# Patient Record
Sex: Male | Born: 1970 | Race: White | Hispanic: No | Smoking: Never smoker
Health system: Southern US, Community
[De-identification: ages and names within clinical notes are randomized; demographics above are authoritative.]

## PROBLEM LIST (undated history)

## (undated) DIAGNOSIS — I10 Essential (primary) hypertension: Secondary | ICD-10-CM

## (undated) DIAGNOSIS — K319 Disease of stomach and duodenum, unspecified: Secondary | ICD-10-CM

## (undated) DIAGNOSIS — Z8719 Personal history of other diseases of the digestive system: Secondary | ICD-10-CM

## (undated) DIAGNOSIS — G4733 Obstructive sleep apnea (adult) (pediatric): Secondary | ICD-10-CM

## (undated) DIAGNOSIS — F32A Depression, unspecified: Secondary | ICD-10-CM

## (undated) DIAGNOSIS — Z87442 Personal history of urinary calculi: Secondary | ICD-10-CM

## (undated) DIAGNOSIS — K2270 Barrett's esophagus without dysplasia: Secondary | ICD-10-CM

## (undated) DIAGNOSIS — N400 Enlarged prostate without lower urinary tract symptoms: Secondary | ICD-10-CM

## (undated) DIAGNOSIS — K219 Gastro-esophageal reflux disease without esophagitis: Secondary | ICD-10-CM

## (undated) DIAGNOSIS — Z9989 Dependence on other enabling machines and devices: Secondary | ICD-10-CM

## (undated) DIAGNOSIS — F329 Major depressive disorder, single episode, unspecified: Secondary | ICD-10-CM

## (undated) DIAGNOSIS — K317 Polyp of stomach and duodenum: Secondary | ICD-10-CM

## (undated) DIAGNOSIS — Z87898 Personal history of other specified conditions: Secondary | ICD-10-CM

## (undated) DIAGNOSIS — G43909 Migraine, unspecified, not intractable, without status migrainosus: Secondary | ICD-10-CM

## (undated) HISTORY — DX: Personal history of urinary calculi: Z87.442

## (undated) HISTORY — DX: Disease of stomach and duodenum, unspecified: K31.9

## (undated) HISTORY — DX: Polyp of stomach and duodenum: K31.7

## (undated) HISTORY — DX: Migraine, unspecified, not intractable, without status migrainosus: G43.909

---

## 1997-02-14 HISTORY — PX: APPENDECTOMY: SHX54

## 2000-02-15 HISTORY — PX: NASAL SEPTUM SURGERY: SHX37

## 2004-02-05 ENCOUNTER — Ambulatory Visit: Payer: Self-pay | Admitting: General Surgery

## 2005-05-26 ENCOUNTER — Ambulatory Visit: Payer: Self-pay | Admitting: Otolaryngology

## 2006-11-07 ENCOUNTER — Ambulatory Visit: Payer: Self-pay | Admitting: Family Medicine

## 2007-06-01 ENCOUNTER — Encounter: Admission: RE | Admit: 2007-06-01 | Discharge: 2007-06-01 | Payer: Self-pay | Admitting: Family Medicine

## 2009-03-03 ENCOUNTER — Observation Stay: Payer: Self-pay | Admitting: Internal Medicine

## 2009-11-24 ENCOUNTER — Ambulatory Visit: Payer: Self-pay | Admitting: Family Medicine

## 2010-04-15 ENCOUNTER — Encounter (HOSPITAL_COMMUNITY): Payer: Self-pay | Admitting: Radiology

## 2010-04-15 ENCOUNTER — Emergency Department (HOSPITAL_COMMUNITY)
Admission: EM | Admit: 2010-04-15 | Discharge: 2010-04-15 | Disposition: A | Discharge status: Home / Self Care | Payer: 59 | Attending: Emergency Medicine | Admitting: Emergency Medicine

## 2010-04-15 ENCOUNTER — Emergency Department (HOSPITAL_COMMUNITY): Payer: 59

## 2010-04-15 DIAGNOSIS — K219 Gastro-esophageal reflux disease without esophagitis: Secondary | ICD-10-CM | POA: Insufficient documentation

## 2010-04-15 DIAGNOSIS — Z79899 Other long term (current) drug therapy: Secondary | ICD-10-CM | POA: Insufficient documentation

## 2010-04-15 DIAGNOSIS — G8929 Other chronic pain: Secondary | ICD-10-CM | POA: Insufficient documentation

## 2010-04-15 DIAGNOSIS — I10 Essential (primary) hypertension: Secondary | ICD-10-CM | POA: Insufficient documentation

## 2010-04-15 DIAGNOSIS — R109 Unspecified abdominal pain: Secondary | ICD-10-CM | POA: Insufficient documentation

## 2010-04-15 LAB — URINALYSIS, ROUTINE W REFLEX MICROSCOPIC
Ketones, ur: NEGATIVE mg/dL
Nitrite: NEGATIVE
Protein, ur: NEGATIVE mg/dL
Urobilinogen, UA: 0.2 mg/dL (ref 0.0–1.0)

## 2010-04-15 LAB — CBC
MCH: 29.9 pg (ref 26.0–34.0)
MCHC: 34.1 g/dL (ref 30.0–36.0)
Platelets: 286 10*3/uL (ref 150–400)

## 2010-04-15 LAB — BASIC METABOLIC PANEL
BUN: 10 mg/dL (ref 6–23)
CO2: 30 mEq/L (ref 19–32)
Calcium: 9.6 mg/dL (ref 8.4–10.5)
Chloride: 103 mEq/L (ref 96–112)
Creatinine, Ser: 1.01 mg/dL (ref 0.4–1.5)

## 2010-04-15 LAB — DIFFERENTIAL
Basophils Absolute: 0 10*3/uL (ref 0.0–0.1)
Basophils Relative: 0 % (ref 0–1)
Eosinophils Absolute: 0.1 10*3/uL (ref 0.0–0.7)
Monocytes Absolute: 0.6 10*3/uL (ref 0.1–1.0)
Monocytes Relative: 6 % (ref 3–12)
Neutrophils Relative %: 60 % (ref 43–77)

## 2010-10-10 ENCOUNTER — Ambulatory Visit: Payer: Self-pay | Admitting: Family Medicine

## 2011-02-15 HISTORY — PX: UPPER GI ENDOSCOPY: SHX6162

## 2011-04-12 ENCOUNTER — Emergency Department: Payer: Self-pay | Admitting: Emergency Medicine

## 2011-04-12 LAB — URINALYSIS, COMPLETE
Bacteria: NEGATIVE
Bilirubin,UR: NEGATIVE
Blood: NEGATIVE
Glucose,UR: NEGATIVE mg/dL (ref 0–75)
Leukocyte Esterase: NEGATIVE
Nitrite: NEGATIVE
RBC,UR: NONE SEEN /HPF (ref 0–5)
Squamous Epithelial: NONE SEEN
WBC UR: NONE SEEN /HPF (ref 0–5)

## 2011-04-12 LAB — COMPREHENSIVE METABOLIC PANEL
Albumin: 4.1 g/dL (ref 3.4–5.0)
Alkaline Phosphatase: 103 U/L (ref 50–136)
BUN: 13 mg/dL (ref 7–18)
Bilirubin,Total: 0.5 mg/dL (ref 0.2–1.0)
Calcium, Total: 8.8 mg/dL (ref 8.5–10.1)
Creatinine: 1.19 mg/dL (ref 0.60–1.30)
EGFR (Non-African Amer.): >60
Glucose: 97 mg/dL (ref 65–99)
Potassium: 4.1 mmol/L (ref 3.5–5.1)
SGPT (ALT): 73 U/L
Total Protein: 7.3 g/dL (ref 6.4–8.2)

## 2011-04-12 LAB — CBC
MCV: 89 fL (ref 80–100)
Platelet: 266 10*3/uL (ref 150–440)
RBC: 4.74 10*6/uL (ref 4.40–5.90)
WBC: 6.8 10*3/uL (ref 3.8–10.6)

## 2011-04-12 LAB — TROPONIN I: Troponin-I: <0.02 ng/mL

## 2011-04-12 LAB — CK TOTAL AND CKMB (NOT AT ARMC)
CK, Total: 140 U/L (ref 35–232)
CK-MB: 0.7 ng/mL (ref 0.5–3.6)

## 2011-04-12 LAB — APTT: Activated PTT: 30.6 secs (ref 23.6–35.9)

## 2011-12-08 ENCOUNTER — Ambulatory Visit: Payer: Self-pay | Admitting: Urology

## 2011-12-15 ENCOUNTER — Ambulatory Visit: Payer: Self-pay | Admitting: Urology

## 2012-07-11 ENCOUNTER — Other Ambulatory Visit: Payer: Self-pay | Admitting: Gastroenterology

## 2012-07-11 DIAGNOSIS — R1012 Left upper quadrant pain: Secondary | ICD-10-CM

## 2012-07-17 ENCOUNTER — Ambulatory Visit
Admission: RE | Admit: 2012-07-17 | Discharge: 2012-07-17 | Disposition: A | Discharge status: Home / Self Care | Payer: 59 | Source: Ambulatory Visit | Attending: Gastroenterology | Admitting: Gastroenterology

## 2012-07-17 ENCOUNTER — Other Ambulatory Visit: Payer: 59

## 2012-07-17 DIAGNOSIS — R1012 Left upper quadrant pain: Secondary | ICD-10-CM

## 2012-08-20 ENCOUNTER — Emergency Department: Payer: Self-pay | Admitting: Emergency Medicine

## 2012-08-30 ENCOUNTER — Emergency Department (HOSPITAL_COMMUNITY): Payer: 59

## 2012-08-30 ENCOUNTER — Encounter (HOSPITAL_COMMUNITY): Payer: Self-pay | Admitting: Emergency Medicine

## 2012-08-30 ENCOUNTER — Observation Stay (HOSPITAL_COMMUNITY)
Admission: EM | Admit: 2012-08-30 | Discharge: 2012-09-01 | Disposition: A | Discharge status: Home / Self Care | Payer: 59 | Attending: Internal Medicine | Admitting: Internal Medicine

## 2012-08-30 DIAGNOSIS — R51 Headache: Secondary | ICD-10-CM | POA: Insufficient documentation

## 2012-08-30 DIAGNOSIS — K208 Other esophagitis without bleeding: Secondary | ICD-10-CM | POA: Insufficient documentation

## 2012-08-30 DIAGNOSIS — T50905A Adverse effect of unspecified drugs, medicaments and biological substances, initial encounter: Secondary | ICD-10-CM | POA: Diagnosis present

## 2012-08-30 DIAGNOSIS — N179 Acute kidney failure, unspecified: Secondary | ICD-10-CM

## 2012-08-30 DIAGNOSIS — K2270 Barrett's esophagus without dysplasia: Secondary | ICD-10-CM | POA: Insufficient documentation

## 2012-08-30 DIAGNOSIS — N289 Disorder of kidney and ureter, unspecified: Secondary | ICD-10-CM | POA: Insufficient documentation

## 2012-08-30 DIAGNOSIS — J189 Pneumonia, unspecified organism: Principal | ICD-10-CM | POA: Insufficient documentation

## 2012-08-30 DIAGNOSIS — R0902 Hypoxemia: Secondary | ICD-10-CM | POA: Insufficient documentation

## 2012-08-30 DIAGNOSIS — G4733 Obstructive sleep apnea (adult) (pediatric): Secondary | ICD-10-CM | POA: Insufficient documentation

## 2012-08-30 HISTORY — DX: Barrett's esophagus without dysplasia: K22.70

## 2012-08-30 HISTORY — DX: Essential (primary) hypertension: I10

## 2012-08-30 HISTORY — DX: Adverse effect of unspecified drugs, medicaments and biological substances, initial encounter: T50.905A

## 2012-08-30 HISTORY — DX: Personal history of other diseases of the digestive system: Z87.19

## 2012-08-30 HISTORY — DX: Gastro-esophageal reflux disease without esophagitis: K21.9

## 2012-08-30 LAB — COMPREHENSIVE METABOLIC PANEL
ALT: 22 U/L (ref 0–53)
AST: 24 U/L (ref 0–37)
Albumin: 4.1 g/dL (ref 3.5–5.2)
Alkaline Phosphatase: 172 U/L — ABNORMAL HIGH (ref 39–117)
Chloride: 100 mEq/L (ref 96–112)
Potassium: 3.8 mEq/L (ref 3.5–5.1)
Sodium: 137 mEq/L (ref 135–145)
Total Bilirubin: 0.7 mg/dL (ref 0.3–1.2)

## 2012-08-30 LAB — CBC
HCT: 42.1 % (ref 39.0–52.0)
MCHC: 36.1 g/dL — ABNORMAL HIGH (ref 30.0–36.0)
Platelets: 385 10*3/uL (ref 150–400)
RDW: 12.8 % (ref 11.5–15.5)

## 2012-08-30 LAB — URINALYSIS, ROUTINE W REFLEX MICROSCOPIC
Glucose, UA: NEGATIVE mg/dL
Leukocytes, UA: NEGATIVE
Nitrite: NEGATIVE
Specific Gravity, Urine: 1.019 (ref 1.005–1.030)
pH: 7.5 (ref 5.0–8.0)

## 2012-08-30 LAB — POCT I-STAT, CHEM 8
Glucose, Bld: 99 mg/dL (ref 70–99)
HCT: 43 % (ref 39.0–52.0)
Hemoglobin: 14.6 g/dL (ref 13.0–17.0)
Potassium: 3.8 mEq/L (ref 3.5–5.1)
Sodium: 137 mEq/L (ref 135–145)

## 2012-08-30 LAB — STREP PNEUMONIAE URINARY ANTIGEN: Strep Pneumo Urinary Antigen: NEGATIVE

## 2012-08-30 LAB — CREATININE, URINE, RANDOM: Creatinine, Urine: 35.28 mg/dL

## 2012-08-30 MED ORDER — VANCOMYCIN HCL IN DEXTROSE 1-5 GM/200ML-% IV SOLN
1000.0000 mg | Freq: Once | INTRAVENOUS | Status: AC
  Administered 2012-08-30: 1000 mg via INTRAVENOUS
  Filled 2012-08-30: qty 200

## 2012-08-30 MED ORDER — LEVOFLOXACIN 500 MG PO TABS
500.0000 mg | ORAL_TABLET | Freq: Every day | ORAL | Status: AC
  Administered 2012-08-30 – 2012-08-31 (×2): 500 mg via ORAL
  Filled 2012-08-30 (×3): qty 1

## 2012-08-30 MED ORDER — ACETAMINOPHEN-CODEINE 120-12 MG/5ML PO SOLN
5.0000 mL | Freq: Four times a day (QID) | ORAL | Status: DC | PRN
  Administered 2012-08-31 (×3): 5 mL via ORAL
  Filled 2012-08-30 (×4): qty 10

## 2012-08-30 MED ORDER — PIPERACILLIN-TAZOBACTAM 3.375 G IVPB
3.3750 g | Freq: Once | INTRAVENOUS | Status: AC
  Administered 2012-08-30: 3.375 g via INTRAVENOUS
  Filled 2012-08-30: qty 50

## 2012-08-30 MED ORDER — ACETAMINOPHEN-CODEINE 120-12 MG/5ML PO SOLN
5.0000 mL | Freq: Once | ORAL | Status: AC
  Administered 2012-08-30: 5 mL via ORAL
  Filled 2012-08-30: qty 10

## 2012-08-30 MED ORDER — HEPARIN SODIUM (PORCINE) 5000 UNIT/ML IJ SOLN
5000.0000 [IU] | Freq: Three times a day (TID) | INTRAMUSCULAR | Status: DC
  Administered 2012-08-30 (×2): 5000 [IU] via SUBCUTANEOUS
  Filled 2012-08-30 (×8): qty 1

## 2012-08-30 MED ORDER — PANTOPRAZOLE SODIUM 40 MG PO TBEC
80.0000 mg | DELAYED_RELEASE_TABLET | Freq: Every day | ORAL | Status: DC
  Administered 2012-08-30 – 2012-08-31 (×2): 80 mg via ORAL
  Filled 2012-08-30 (×2): qty 2

## 2012-08-30 MED ORDER — SODIUM CHLORIDE 0.9 % IV SOLN
INTRAVENOUS | Status: DC
  Administered 2012-08-30: 06:00:00 via INTRAVENOUS
  Administered 2012-08-30: 1000 mL via INTRAVENOUS
  Administered 2012-08-31 (×2): via INTRAVENOUS

## 2012-08-30 MED ORDER — IOHEXOL 350 MG/ML SOLN
100.0000 mL | Freq: Once | INTRAVENOUS | Status: AC | PRN
  Administered 2012-08-30: 75 mL via INTRAVENOUS

## 2012-08-30 MED ORDER — SUCRALFATE 1 GM/10ML PO SUSP
1.0000 g | Freq: Three times a day (TID) | ORAL | Status: DC
  Administered 2012-08-30 – 2012-09-01 (×9): 1 g via ORAL
  Filled 2012-08-30 (×15): qty 10

## 2012-08-30 MED ORDER — SODIUM CHLORIDE 0.9 % IV SOLN
INTRAVENOUS | Status: AC
  Filled 2012-08-30: qty 100

## 2012-08-30 NOTE — ED Notes (Signed)
Pt o2 sat 86% on RA, pt placed on 2L via [place]

## 2012-08-30 NOTE — H&P (Signed)
Date: 08/30/2012               Patient Name:  John [name] MRN: [id number]  DOB: 04-Dec-1970 Age / Sex: 42 y.o., male   PCP: No primary provider on file.         Medical Service: Internal Medicine Teaching Service         Attending Physician: Dr. Jonah Blue, DO    First Contact: Dr. Mikey Bussing Pager: [phone]  Second Contact: Dr. Everardo Beals Pager: [phone]       After Hours (After 5p/  First Contact Pager: [phone]  weekends / holidays): Second Contact Pager: [phone]   Chief Complaint: cough, sore throat  History of Present Illness:  The patient is a 42 YO man with PMH of Barrett's esophagus, OSA, HTN who is presenting today for a cough and sore throat. He states that on July 4th he starting feeling poorly and felt that he had a cold. He states that he went to an urgent care and was told he had a pneumonia based on a chest x-ray. He was given prescription for doxycycline and started taking it. He then developed severe throat pain with this medication and stopped taking it. He again presented to the clinic and was told that he needed a different medication. He was then prescribed levaquin and started taking it the Saturday prior to admission (5 days pta). He states that his feelings of cough and SOB were improving with this medicine. He was felt like he had some fevers with this illness and those were also improving with the medicine. Overall his symptoms were improved with the exception of some night time cough and sore throat. So he went to his GI doctor (Dr. Elnoria Howard) yesterday and was given a prescription for fluconazole as he was told he might have ulcers and fungal infection. He started taking that and has not noticed a difference. He is currently having a sensation of obstruction in his throat and has self limited to liquid diet the last week or so. He does not smoke or drink alcohol or do other drugs. He does get migraines and has had some over the past two weeks. He has some chronic neck  stiffness which is unchanged recently and he is able to move his neck without pain. He is not having abdominal pain, nausea, vomiting, diarrhea, constipation. He is not having leg swelling and has not been immobilized in the last several weeks. He has not had any recent travel. He also has OSA and has been unable to use the mask due to his cough recently. He does also have high blood pressure and takes lisinopril for that which he has been taking throughout this episode.   Meds: Current Facility-Administered Medications  Medication Dose Route Frequency Provider Last Rate Last Dose  . 0.9 %  sodium chloride infusion   Intravenous Continuous Sunnie Nielsen, MD 125 mL/hr at 08/30/12 5784     Current Outpatient Prescriptions  Medication Sig Dispense Refill  . baclofen (LIORESAL) 10 MG tablet Take 10 mg by mouth 3 (three) times daily as needed (for muscle spasms).      . Diclofenac Potassium (CAMBIA PO) Take 50 mg by mouth daily as needed (for headaches).      . esomeprazole (NEXIUM) 40 MG capsule Take 40 mg by mouth daily before breakfast.      . fluconazole (DIFLUCAN) 100 MG tablet Take 100 mg by mouth daily.      Marland Kitchen guaiFENesin (MUCINEX) 600 MG 12 hr  tablet Take 600 mg by mouth 2 (two) times daily.      Marland Kitchen lamivudine (EPIVIR) 100 MG tablet Take 200 mg by mouth at bedtime.      Marland Kitchen levofloxacin (LEVAQUIN) 500 MG tablet Take 500 mg by mouth daily.      Marland Kitchen lisinopril (PRINIVIL,ZESTRIL) 10 MG tablet Take 10 mg by mouth daily.        Allergies: Allergies as of 08/30/2012 - Review Complete 08/30/2012  Allergen Reaction Noted  . Doxycycline Other (See Comments) 08/30/2012  . Nitroglycerin Other (See Comments) 08/30/2012   Past Medical History  Diagnosis Date  . Hx of appendectomy    History reviewed. No pertinent past surgical history. No family history on file. History   Social History  . Marital Status: Married    Spouse Name: N/A    Number of Children: N/A  . Years of Education: N/A    Occupational History  . Not on file.   Social History Main Topics  . Smoking status: Never Smoker   . Smokeless tobacco: Not on file  . Alcohol Use: No  . Drug Use: No  . Sexually Active: Not on file   Other Topics Concern  . Not on file   Social History Narrative  . No narrative on file    Review of Systems: A comprehensive 10 point review of systems was performed and other than pertinent positives and negatives listed above in HPI was negative.   Physical Exam: Blood pressure 121/70, pulse 69, temperature 98.4 F (36.9 C), temperature source Oral, resp. rate 16, SpO2 94.00%. General: resting in bed, pleasant, bearded HEENT: PERRL, EOMI, no scleral icterus, no erythema in the nostrils, some mild erythema in the throat but not inflammation or swelling or discharge Cardiac: RRR, no rubs, murmurs or gallops Pulm: clear to auscultation bilaterally, moving normal volumes of air, some mild inspiratory roughness which clears with cough Abd: soft, nontender, nondistended, BS present Ext: warm and well perfused, no pedal edema, no calf swelling or tenderness Neuro: alert and oriented X3, cranial nerves II-XII grossly intact   Lab results: Basic Metabolic Panel:  Recent Labs  40/98/11 0555  NA 137  K 3.8  CL 100  GLUCOSE 99  BUN 12  CREATININE 1.40*  Bicarb 28  CBC:  Recent Labs  08/30/12 0535 08/30/12 0555  WBC 8.3  --   HGB 15.2 14.6  HCT 42.1 43.0  MCV 88.1  --   PLT 385  --    Imaging results:  Dg Neck Soft Tissue  08/30/2012   *RADIOLOGY REPORT*  Clinical Data: Sore throat; mid chest discomfort.  Cough.  NECK SOFT TISSUES - 1+ VIEW  Comparison: None.  Findings: The nasopharynx, oropharynx and hypopharynx are grossly unremarkable in appearance.  The epiglottis remains normal in thickness.  The aryepiglottic folds are grossly unremarkable.  The proximal trachea is normal in caliber.  Prevertebral soft tissues are within normal limits.  No suspicious  radiopaque foreign bodies are identified.  No acute osseous abnormalities are seen.  Vertebral bodies demonstrate normal height and alignment; intervertebral disc spaces are preserved.  The visualized paranasal sinuses and mastoid air cells are well-aerated.  IMPRESSION: Unremarkable radiograph of the soft tissues of the neck.   Original Report Authenticated By: Tonia Ghent, M.D.   Dg Chest 2 View  08/30/2012   *RADIOLOGY REPORT*  Clinical Data: Sore throat; mid chest discomfort and cough.  CHEST - 2 VIEW  Comparison: None.  Findings: The lungs are well-aerated.  Peribronchial  thickening is noted.  There is no evidence of focal opacification, pleural effusion or pneumothorax.  The heart is normal in size; the mediastinal contour is within normal limits.  No acute osseous abnormalities are seen.  IMPRESSION: Peribronchial thickening noted; lungs otherwise clear.   Original Report Authenticated By: Tonia Ghent, M.D.   Ct Angio Chest Pe W/cm &/or Wo Cm  08/30/2012   *RADIOLOGY REPORT*  Clinical Data: Short of breath, hypoxia concern for pulmonary embolism  CT ANGIOGRAPHY CHEST  Technique:  Multidetector CT imaging of the chest using the standard protocol during bolus administration of intravenous contrast. Multiplanar reconstructed images including MIPs were obtained and reviewed to evaluate the vascular anatomy.  Contrast: 75mL OMNIPAQUE IOHEXOL 350 MG/ML SOLN  Comparison: Chest radiograph 08/30/2012  Findings: There are no filling defects within the pulmonary arteries to suggest acute pulmonary embolism.  No acute findings aorta great vessels.  No pericardial fluid.  Esophagus is normal.  No axillary supraclavicular adenopathy.  No mediastinal adenopathy.  There are multiple fine nodules within the left or right lower lobe which appeared to be in an air space pattern.  Ground-glass opacities in the upper lobes.  There is some potential early consolidation in the left lower lobe (image 76).  Limited view of  the upper abdomen is unremarkable.  Limited view of the skeleton is unremarkable.  The  IMPRESSION:  1.  No evidence of acute pulmonary embolism. 2.  Bilateral fine nodules in an air space pattern suggests diffuse pulmonary infection or inflammation. Consider atypical pulmonary infections.   Original Report Authenticated By: Genevive Bi, M.D.   Assessment & Plan by Problem:  AKI (acute kidney injury) - Likely to be pre-renal given his limited oral intake recently. Will order urine sodium and creatinine for FeNa. Could be ACE-I related and will hold on admission. Could expect that the CT angio chest could worsen this acute renal injury and will follow closely.  -CMP now (given only study I-stat) -BMP in AM -Continue fluids overnight -FeNa  Hypoxia - Likely to be from the resolving pneumonia. Likely not chronically hypoxic as his Hg levels are normal. The patient has a Wells score of 0 and would have been an excellent candidate for d-dimer to rule out PE however had already received CTangio chest in the ED which was negative for PE and ruled out chronic lung disease. Saw some non-specific changes associated with recent/developing pneumonia. Other possibilities include cardiac shunt. Would need ABG on room air to see if truly hypoxic. No concern for chronic lung disease. He does have OSA and may have some accumulation of tissue in the upper airway which is exacerbated by recent cold/pneumonia. Unclear if he was hypoxic when started on treatment for pneumonia.  -Monitor on O2 overnight -Wean as able -Consider ABG on room air to evaluate whether truly hypoxic  CAP (community acquired pneumonia) - Likely to be resolving/resolved. CXR clear currently and unable to view previous film with some non-specific changes on CTangio chest. Will continue levaquin PO to complete 7 day course for today and tomorrow then stop. Likely that the cough will continue for several weeks after pneumonia has resolved. Will  check strep and legionella urinary antigens. Will check sputum culture and blood cultures. Will check HIV.  -Urine strep/legionella antigens -HIV -Continue levaquin as symptomatically patient is doing better for today and tomorrow -O2 as needed with continuous pulse ox  Pill esophagitis - Sounds as though symptoms are fairly classic with doxycycline exposure. Will stop fluconazole as no  signs of fungal infection. Will use sucralfate for coating and protective effects. Will continue home PPI.  -Sucralfate with meals and before bedtime -Protonix daily -Mechanical soft diet    OSA (obstructive sleep apnea) - Will hold CPAP due to coughing and resume as able.   DVT ppx - heparin 5000 units  TID  Dispo: Disposition is deferred at this time, awaiting improvement of current medical problems. Anticipated discharge in approximately 1-2 day(s).   The patient does not have a current PCP (No primary provider on file.) and does need an [facility] hospital follow-up appointment after discharge.  The patient does not have transportation limitations that hinder transportation to clinic appointments.  Signed: Judie Bonus, MD 08/30/2012, 9:57 AM

## 2012-08-30 NOTE — ED Provider Notes (Signed)
History    CSN: [id number] Arrival date & time 08/30/12  0330  First MD Initiated Contact with Patient 08/30/12 0500     Chief Complaint  Patient presents with  . Sore Throat   (Consider location/radiation/quality/duration/timing/severity/associated sxs/prior Treatment) HPI History provided by patient. Has been on antibiotics for the last 2 weeks for pneumonia. Initially took doxycycline but did not tolerate that very well so he was switched to Levaquin. He has been having bodyaches, fevers and cough and reports that he had a chest x-ray at walk in clinic that showed pneumonia. 2 days ago developed sore throat with some voice changes. He is followed by gastroenterology for Barrett's esophagus, so he was evaluated in the GI clinic and yesterday prescribed fluconazole for possible "ulcerations". He presents to the emergency department for worsening throat pain, worse with cough. No trouble swallowing. He feels like he is having trouble breathing.  Symptoms moderate in severity. No emesis. No rash. No known sick contacts.  Past Medical History  Diagnosis Date  . Hx of appendectomy    History reviewed. No pertinent past surgical history. No family history on file. History  Substance Use Topics  . Smoking status: Never Smoker   . Smokeless tobacco: Not on file  . Alcohol Use: No    Review of Systems  Constitutional: Positive for fever and chills.  HENT: Positive for sore throat. Negative for neck pain and neck stiffness.   Eyes: Negative for pain.  Respiratory: Positive for cough and shortness of breath. Negative for wheezing.   Cardiovascular: Negative for chest pain.  Gastrointestinal: Negative for abdominal pain.  Genitourinary: Negative for dysuria.  Musculoskeletal: Negative for back pain.  Skin: Negative for rash.  Neurological: Negative for headaches.  All other systems reviewed and are negative.    Allergies  Doxycycline and Nitroglycerin  Home Medications    Current Outpatient Rx  Name  Route  Sig  Dispense  Refill  . baclofen (LIORESAL) 10 MG tablet   Oral   Take 10 mg by mouth 3 (three) times daily as needed (for muscle spasms).         . Diclofenac Potassium (CAMBIA PO)   Oral   Take 50 mg by mouth daily as needed (for headaches).         . esomeprazole (NEXIUM) 40 MG capsule   Oral   Take 40 mg by mouth daily before breakfast.         . fluconazole (DIFLUCAN) 100 MG tablet   Oral   Take 100 mg by mouth daily.         Marland Kitchen guaiFENesin (MUCINEX) 600 MG 12 hr tablet   Oral   Take 600 mg by mouth 2 (two) times daily.         Marland Kitchen lamivudine (EPIVIR) 100 MG tablet   Oral   Take 200 mg by mouth at bedtime.         Marland Kitchen levofloxacin (LEVAQUIN) 500 MG tablet   Oral   Take 500 mg by mouth daily.         Marland Kitchen lisinopril (PRINIVIL,ZESTRIL) 10 MG tablet   Oral   Take 10 mg by mouth daily.          BP 140/88  Pulse 82  Temp(Src) 98.6 F (37 C) (Oral)  Resp 18  SpO2 92% Physical Exam  Constitutional: He is oriented to person, place, and time. He appears well-developed and well-nourished.  HENT:  Head: Normocephalic and atraumatic.  Mouth/Throat: Oropharynx is clear  and moist. No oropharyngeal exudate.  Eyes: EOM are normal. Pupils are equal, round, and reactive to light.  Neck: Neck supple. No tracheal deviation present.  Cardiovascular: Normal rate, regular rhythm and intact distal pulses.   Pulmonary/Chest: Effort normal and breath sounds normal. No stridor. No respiratory distress. He has no wheezes.  Abdominal: Soft. There is no tenderness.  Musculoskeletal: Normal range of motion. He exhibits no edema and no tenderness.  Lymphadenopathy:    He has no cervical adenopathy.  Neurological: He is alert and oriented to person, place, and time.  Skin: Skin is warm and dry.    ED Course  Procedures (including critical care time)  Results for orders placed during the hospital encounter of 08/30/12  CBC      Result  Value Range   WBC 8.3  4.0 - 10.5 K/uL   RBC 4.78  4.22 - 5.81 MIL/uL   Hemoglobin 15.2  13.0 - 17.0 g/dL   HCT 45.4  09.8 - 11.9 %   MCV 88.1  78.0 - 100.0 fL   MCH 31.8  26.0 - 34.0 pg   MCHC 36.1 (*) 30.0 - 36.0 g/dL   RDW 14.7  82.9 - 56.2 %   Platelets 385  150 - 400 K/uL  SODIUM, URINE, RANDOM      Result Value Range   Sodium, Ur 46    CREATININE, URINE, RANDOM      Result Value Range   Creatinine, Urine 35.28    URINALYSIS, ROUTINE W REFLEX MICROSCOPIC      Result Value Range   Color, Urine YELLOW  YELLOW   APPearance CLEAR  CLEAR   Specific Gravity, Urine 1.019  1.005 - 1.030   pH 7.5  5.0 - 8.0   Glucose, UA NEGATIVE  NEGATIVE mg/dL   Hgb urine dipstick NEGATIVE  NEGATIVE   Bilirubin Urine NEGATIVE  NEGATIVE   Ketones, ur NEGATIVE  NEGATIVE mg/dL   Protein, ur NEGATIVE  NEGATIVE mg/dL   Urobilinogen, UA 0.2  0.0 - 1.0 mg/dL   Nitrite NEGATIVE  NEGATIVE   Leukocytes, UA NEGATIVE  NEGATIVE  COMPREHENSIVE METABOLIC PANEL      Result Value Range   Sodium 137  135 - 145 mEq/L   Potassium 3.8  3.5 - 5.1 mEq/L   Chloride 100  96 - 112 mEq/L   CO2 28  19 - 32 mEq/L   Glucose, Bld 103 (*) 70 - 99 mg/dL   BUN 11  6 - 23 mg/dL   Creatinine, Ser 1.30  0.50 - 1.35 mg/dL   Calcium 9.5  8.4 - 86.5 mg/dL   Total Protein 7.3  6.0 - 8.3 g/dL   Albumin 4.1  3.5 - 5.2 g/dL   AST 24  0 - 37 U/L   ALT 22  0 - 53 U/L   Alkaline Phosphatase 172 (*) 39 - 117 U/L   Total Bilirubin 0.7  0.3 - 1.2 mg/dL   GFR calc non Af Amer 71 (*) >90 mL/min   GFR calc Af Amer 82 (*) >90 mL/min  HIV ANTIBODY (ROUTINE TESTING)      Result Value Range   HIV NON REACTIVE  NON REACTIVE  STREP PNEUMONIAE URINARY ANTIGEN      Result Value Range   Strep Pneumo Urinary Antigen NEGATIVE  NEGATIVE  POCT I-STAT, CHEM 8      Result Value Range   Sodium 137  135 - 145 mEq/L   Potassium 3.8  3.5 - 5.1 mEq/L  Chloride 100  96 - 112 mEq/L   BUN 12  6 - 23 mg/dL   Creatinine, Ser 1.61 (*) 0.50 - 1.35 mg/dL    Glucose, Bld 99  70 - 99 mg/dL   Calcium, Ion 0.96  0.45 - 1.23 mmol/L   TCO2 28  0 - 100 mmol/L   Hemoglobin 14.6  13.0 - 17.0 g/dL   HCT 40.9  81.1 - 91.4 %   Dg Neck Soft Tissue  08/30/2012   *RADIOLOGY REPORT*  Clinical Data: Sore throat; mid chest discomfort.  Cough.  NECK SOFT TISSUES - 1+ VIEW  Comparison: None.  Findings: The nasopharynx, oropharynx and hypopharynx are grossly unremarkable in appearance.  The epiglottis remains normal in thickness.  The aryepiglottic folds are grossly unremarkable.  The proximal trachea is normal in caliber.  Prevertebral soft tissues are within normal limits.  No suspicious radiopaque foreign bodies are identified.  No acute osseous abnormalities are seen.  Vertebral bodies demonstrate normal height and alignment; intervertebral disc spaces are preserved.  The visualized paranasal sinuses and mastoid air cells are well-aerated.  IMPRESSION: Unremarkable radiograph of the soft tissues of the neck.   Original Report Authenticated By: Tonia Ghent, M.D.   Dg Chest 2 View  08/30/2012   *RADIOLOGY REPORT*  Clinical Data: Sore throat; mid chest discomfort and cough.  CHEST - 2 VIEW  Comparison: None.  Findings: The lungs are well-aerated.  Peribronchial thickening is noted.  There is no evidence of focal opacification, pleural effusion or pneumothorax.  The heart is normal in size; the mediastinal contour is within normal limits.  No acute osseous abnormalities are seen.  IMPRESSION: Peribronchial thickening noted; lungs otherwise clear.   Original Report Authenticated By: Tonia Ghent, M.D.   Ct Angio Chest Pe W/cm &/or Wo Cm  08/30/2012   *RADIOLOGY REPORT*  Clinical Data: Short of breath, hypoxia concern for pulmonary embolism  CT ANGIOGRAPHY CHEST  Technique:  Multidetector CT imaging of the chest using the standard protocol during bolus administration of intravenous contrast. Multiplanar reconstructed images including MIPs were obtained and reviewed to  evaluate the vascular anatomy.  Contrast: 75mL OMNIPAQUE IOHEXOL 350 MG/ML SOLN  Comparison: Chest radiograph 08/30/2012  Findings: There are no filling defects within the pulmonary arteries to suggest acute pulmonary embolism.  No acute findings aorta great vessels.  No pericardial fluid.  Esophagus is normal.  No axillary supraclavicular adenopathy.  No mediastinal adenopathy.  There are multiple fine nodules within the left or right lower lobe which appeared to be in an air space pattern.  Ground-glass opacities in the upper lobes.  There is some potential early consolidation in the left lower lobe (image 76).  Limited view of the upper abdomen is unremarkable.  Limited view of the skeleton is unremarkable.  The  IMPRESSION:  1.  No evidence of acute pulmonary embolism. 2.  Bilateral fine nodules in an air space pattern suggests diffuse pulmonary infection or inflammation. Consider atypical pulmonary infections.   Original Report Authenticated By: Genevive Bi, M.D.   IV antibiotics  Medicine consult / outpatient clinics to admit MDM  Persistent cough, fever and shortness of breath with hypoxia despite outpatient antibiotics  Labs. Imaging.  Medications provided  Oxygen provided  Medical admit  Sunnie Nielsen, MD 08/31/12 [phone]

## 2012-08-30 NOTE — Progress Notes (Signed)
Patient concerned that he is not getting enough oxygen. States that oxygen saturation goes down to 85 (this is only when movement of the hands in which probe is on). Feels that throat is swelling. Throat was assessed no swelling evident. Patient placed on 2L O2 [place] to make him more comfortable, oxygen was 90 on RA. Will continue to monitor.   Collin Hendley J. Lendell Caprice RN

## 2012-08-30 NOTE — ED Notes (Signed)
Pt transported to XR.  

## 2012-08-30 NOTE — ED Notes (Signed)
PT. REPORTS SORE THROAT WITH SOB CURRENTLY TAKING ORAL ANTIBIOTIC FOR PNEUMONIA.

## 2012-08-30 NOTE — ED Notes (Signed)
Pt has a urine cup and knows we need a urine sample from him

## 2012-08-30 NOTE — Plan of Care (Signed)
Problem: Phase I Progression Outcomes Goal: Initial discharge plan identified Outcome: Completed/Met Date Met:  08/30/12 To return home with wife

## 2012-08-30 NOTE — ED Notes (Signed)
Lunch tray ordered 

## 2012-08-30 NOTE — Progress Notes (Signed)
John [name] [id number] Admitted to 5W7: 08/30/2012 4:17 PM Attending Provider: Jonah Blue, DO    John [name] is a 42 y.o. male patient admitted from ED awake, alert  & orientated  X 3,  Full Code, VSS - Blood pressure 118/82, pulse 70, temperature 98.4 F (36.9 C), temperature source Oral, resp. rate 18, height 5\' 8"  (1.727 m), weight 83.734 kg (184 lb 9.6 oz), SpO2 92.00%., R/A, no c/o shortness of breath, no c/o chest pain, no distress noted.  placed and pt is currently running:na.   IV site WDL:  antecubital right, condition patent and no redness with a transparent dsg that's clean dry and intact.  Allergies:   Allergies  Allergen Reactions  . Doxycycline Other (See Comments)    Burning in throat  . Nitroglycerin Other (See Comments)    Severe headache     Past Medical History  Diagnosis Date  . Barrett esophagus   . Heart murmur     at birth  . Hypertension   . Headache(784.0)     chronic  . Sleep apnea     uses cpap  . Pneumonia 08/2012  . GERD (gastroesophageal reflux disease)   . H/O hiatal hernia     History:  obtained from the patient.  Pt orientation to unit, room and routine. Information packet given to patient/family and safety video watched.  Admission INP armband ID verified with patient/family, and in place. SR up x 2, fall risk assessment complete with Patient and family verbalizing understanding of risks associated with falls. Pt verbalizes an understanding of how to use the call bell and to call for help before getting out of bed.  Skin, clean-dry- intact without evidence of bruising, or skin tears.   No evidence of skin break down noted on exam.    Will cont to monitor and assist as needed.  Joana Reamer, RN 08/30/2012 4:17 PM

## 2012-08-30 NOTE — ED Notes (Signed)
Pt. returned from XR. 

## 2012-08-30 NOTE — ED Notes (Signed)
Room air O2 sat at rest 93%. O2 sat while ambulating 87%  MD notified.

## 2012-08-30 NOTE — ED Notes (Signed)
Phlebotomy at bedside.

## 2012-08-31 DIAGNOSIS — K2270 Barrett's esophagus without dysplasia: Secondary | ICD-10-CM

## 2012-08-31 LAB — BASIC METABOLIC PANEL
CO2: 29 mEq/L (ref 19–32)
Calcium: 8.6 mg/dL (ref 8.4–10.5)
Sodium: 139 mEq/L (ref 135–145)

## 2012-08-31 LAB — LEGIONELLA ANTIGEN, URINE

## 2012-08-31 MED ORDER — DEXTROSE 5 % IV SOLN
500.0000 mg | INTRAVENOUS | Status: DC
  Administered 2012-08-31: 500 mg via INTRAVENOUS
  Filled 2012-08-31 (×3): qty 500

## 2012-08-31 MED ORDER — PANTOPRAZOLE SODIUM 40 MG PO TBEC
40.0000 mg | DELAYED_RELEASE_TABLET | Freq: Two times a day (BID) | ORAL | Status: DC
  Administered 2012-08-31 – 2012-09-01 (×2): 40 mg via ORAL
  Filled 2012-08-31 (×2): qty 1

## 2012-08-31 MED ORDER — LAMOTRIGINE 200 MG PO TABS
200.0000 mg | ORAL_TABLET | Freq: Every day | ORAL | Status: DC
  Administered 2012-08-31: 200 mg via ORAL
  Filled 2012-08-31 (×2): qty 1

## 2012-08-31 MED ORDER — ONDANSETRON HCL 4 MG/2ML IJ SOLN
4.0000 mg | Freq: Four times a day (QID) | INTRAMUSCULAR | Status: DC | PRN
  Administered 2012-08-31: 4 mg via INTRAVENOUS

## 2012-08-31 MED ORDER — ONDANSETRON HCL 4 MG/2ML IJ SOLN
INTRAMUSCULAR | Status: AC
  Filled 2012-08-31: qty 2

## 2012-08-31 MED ORDER — PANTOPRAZOLE SODIUM 40 MG PO TBEC: 40.0000 mg | DELAYED_RELEASE_TABLET | Freq: Two times a day (BID) | ORAL | Status: DC

## 2012-08-31 MED ORDER — ONDANSETRON HCL 4 MG PO TABS: 4.0000 mg | ORAL_TABLET | Freq: Four times a day (QID) | ORAL | Status: DC | PRN

## 2012-08-31 MED ORDER — DEXTROSE 5 % IV SOLN
1.0000 g | INTRAVENOUS | Status: DC
  Administered 2012-08-31: 1 g via INTRAVENOUS
  Filled 2012-08-31 (×3): qty 10

## 2012-08-31 NOTE — Progress Notes (Signed)
Subjective: Patient reports he is doing OK.  Reports one episode of feeling like his throat was closing up.  He told the nurse he was SOB and the nurse placed a [place] tube on him and started 2L of O2 (per nurse this was not due to desaturation but to comfort patient).  He continues to cough up a small amount of sputum.  No hemoptysis.  Reports feeling slightly better overall. Objective: Vital signs in last 24 hours: Filed Vitals:   08/31/12 0137 08/31/12 0145 08/31/12 0520 08/31/12 0745  BP:   103/64   Pulse:   72   Temp:   99 F (37.2 C)   TempSrc:   Oral   Resp:   18   Height:    5\' 8"  (1.727 m)  Weight:    185 lb (83.915 kg)  SpO2: 91% 93% 92%    Weight change:   Intake/Output Summary (Last 24 hours) at 08/31/12 1421 Last data filed at 08/31/12 0745  Gross per 24 hour  Intake 3168.75 ml  Output      0 ml  Net 3168.75 ml   General:sitting on bed, NAD HEENT: EOMI, no scleral icterus Cardiac: RRR, no rubs, murmurs or gallops Pulm: clear to auscultation bilaterally Abd: soft, nontender, nondistended, BS normoactive Ext: warm and well perfused, no pedal edema Neuro: alert and oriented X3, cranial nerves II-XII grossly intact  Lab Results: Basic Metabolic Panel:  Recent Labs Lab 08/30/12 1105 08/31/12 0550  NA 137 139  K 3.8 3.9  CL 100 106  CO2 28 29  GLUCOSE 103* 102*  BUN 11 10  CREATININE 1.25 1.34  CALCIUM 9.5 8.6   Liver Function Tests:  Recent Labs Lab 08/30/12 1105  AST 24  ALT 22  ALKPHOS 172*  BILITOT 0.7  PROT 7.3  ALBUMIN 4.1   CBC:  Recent Labs Lab 08/30/12 0535 08/30/12 0555  WBC 8.3  --   HGB 15.2 14.6  HCT 42.1 43.0  MCV 88.1  --   PLT 385  --    Urinalysis:  Recent Labs Lab 08/30/12 1144  COLORURINE YELLOW  LABSPEC 1.019  PHURINE 7.5  GLUCOSEU NEGATIVE  HGBUR NEGATIVE  BILIRUBINUR NEGATIVE  KETONESUR NEGATIVE  PROTEINUR NEGATIVE  UROBILINOGEN 0.2  NITRITE NEGATIVE  LEUKOCYTESUR NEGATIVE    Micro Results: Recent  Results (from the past 240 hour(s))  CULTURE, BLOOD (ROUTINE X 2)     Status: None   Collection Time    08/30/12 11:00 AM      Result Value Range Status   Specimen Description BLOOD LEFT ANTECUBITAL   Final   Special Requests BOTTLES DRAWN AEROBIC ONLY 10CC   Final   Culture  Setup Time 08/30/2012 15:29   Final   Culture     Final   Value:        BLOOD CULTURE RECEIVED NO GROWTH TO DATE CULTURE WILL BE HELD FOR 5 DAYS BEFORE ISSUING A FINAL NEGATIVE REPORT   Report Status PENDING   Incomplete  CULTURE, BLOOD (ROUTINE X 2)     Status: None   Collection Time    08/30/12 11:05 AM      Result Value Range Status   Specimen Description BLOOD ARM LEFT   Final   Special Requests BOTTLES DRAWN AEROBIC ONLY 10CC   Final   Culture  Setup Time 08/30/2012 15:28   Final   Culture     Final   Value:        BLOOD CULTURE  RECEIVED NO GROWTH TO DATE CULTURE WILL BE HELD FOR 5 DAYS BEFORE ISSUING A FINAL NEGATIVE REPORT   Report Status PENDING   Incomplete   Studies/Results: Dg Neck Soft Tissue  08/30/2012   *RADIOLOGY REPORT*  Clinical Data: Sore throat; mid chest discomfort.  Cough.  NECK SOFT TISSUES - 1+ VIEW  Comparison: None.  Findings: The nasopharynx, oropharynx and hypopharynx are grossly unremarkable in appearance.  The epiglottis remains normal in thickness.  The aryepiglottic folds are grossly unremarkable.  The proximal trachea is normal in caliber.  Prevertebral soft tissues are within normal limits.  No suspicious radiopaque foreign bodies are identified.  No acute osseous abnormalities are seen.  Vertebral bodies demonstrate normal height and alignment; intervertebral disc spaces are preserved.  The visualized paranasal sinuses and mastoid air cells are well-aerated.  IMPRESSION: Unremarkable radiograph of the soft tissues of the neck.   Original Report Authenticated By: Tonia Ghent, M.D.   Dg Chest 2 View  08/30/2012   *RADIOLOGY REPORT*  Clinical Data: Sore throat; mid chest discomfort  and cough.  CHEST - 2 VIEW  Comparison: None.  Findings: The lungs are well-aerated.  Peribronchial thickening is noted.  There is no evidence of focal opacification, pleural effusion or pneumothorax.  The heart is normal in size; the mediastinal contour is within normal limits.  No acute osseous abnormalities are seen.  IMPRESSION: Peribronchial thickening noted; lungs otherwise clear.   Original Report Authenticated By: Tonia Ghent, M.D.   Ct Angio Chest Pe W/cm &/or Wo Cm  08/30/2012   *RADIOLOGY REPORT*  Clinical Data: Short of breath, hypoxia concern for pulmonary embolism  CT ANGIOGRAPHY CHEST  Technique:  Multidetector CT imaging of the chest using the standard protocol during bolus administration of intravenous contrast. Multiplanar reconstructed images including MIPs were obtained and reviewed to evaluate the vascular anatomy.  Contrast: 75mL OMNIPAQUE IOHEXOL 350 MG/ML SOLN  Comparison: Chest radiograph 08/30/2012  Findings: There are no filling defects within the pulmonary arteries to suggest acute pulmonary embolism.  No acute findings aorta great vessels.  No pericardial fluid.  Esophagus is normal.  No axillary supraclavicular adenopathy.  No mediastinal adenopathy.  There are multiple fine nodules within the left or right lower lobe which appeared to be in an air space pattern.  Ground-glass opacities in the upper lobes.  There is some potential early consolidation in the left lower lobe (image 76).  Limited view of the upper abdomen is unremarkable.  Limited view of the skeleton is unremarkable.  The  IMPRESSION:  1.  No evidence of acute pulmonary embolism. 2.  Bilateral fine nodules in an air space pattern suggests diffuse pulmonary infection or inflammation. Consider atypical pulmonary infections.   Original Report Authenticated By: Genevive Bi, M.D.   Medications: I have reviewed the patient's current medications. Scheduled Meds: . heparin  5,000 Units Subcutaneous Q8H  .  lamoTRIgine  200 mg Oral QHS  . pantoprazole  80 mg Oral Q1200  . sucralfate  1 g Oral TID WC & HS   Continuous Infusions: . sodium chloride 125 mL/hr at 08/31/12 1107   PRN Meds:.acetaminophen-codeine Assessment/Plan:   AKI (acute kidney injury) -Patient presented with Cr of 1.4, noted to have baseline of 1.  History of poor PO intake the days prior to admission.  Improved to 1.25 with IV fluids.  However patient received CT angio and this morning Cr was 1.34.  Will continue to monitor renal function. -FeNa 1.19%   CAP (community acquired pneumonia) -Change to  IV Ceftriaxone and IV azithromycin -HIV negative -Urine legionella negative -Urine strep pneumo negative -BCx 7/17 NGTD   Pill esophagitis -Patient reports irration started after taking doxycycline for 3 days. -Recently saw GI who prescribed fluconazole.  Will consider restarting, but no signs of Thrush on exam. -Change PPI to BID. -Dys 3 diet. -Carafate with meals.  DVT PPxL Heparin  Dispo: Disposition is deferred at this time, awaiting improvement of current medical problems.  Anticipated discharge in approximately 1-2 day(s).   The patient does not have a current PCP (No primary provider on file.) and does not know need an [facility] hospital follow-up appointment after discharge.  The patient does not have transportation limitations that hinder transportation to clinic appointments.  .Services Needed at time of discharge: Y = Yes, Blank = No PT:   OT:   RN:   Equipment:   Other:     LOS: 1 day   Carlynn Purl, DO 08/31/2012, 2:21 PM

## 2012-08-31 NOTE — Progress Notes (Signed)
Utilization review completed. Moncia Annas, RN, BSN. 

## 2012-08-31 NOTE — Progress Notes (Signed)
Nutrition Brief Note  Patient identified on the Malnutrition Screening Tool (MST) Report for recent weight loss without trying and eating poorly because of a decreased appetite.    Patient reports PTA he was on a "liquid diet" because of throat irritation with his ABX.  He reports he's eating well at this time.  Patient also endorses some weight loss, however, not significant for time frame.  Body mass index is 28.14 kg/(m^2). Patient meets criteria for Overweight based on current BMI.   Current diet order is Dysphagia 3, thin liquids.  Patient is consuming approximately 100% of meals at this time. Labs and medications reviewed.   No nutrition interventions warranted at this time. If nutrition issues arise, please consult RD.   Maureen Chatters, RD, LDN Pager #: [phone] After-Hours Pager #: [phone]

## 2012-08-31 NOTE — H&P (Signed)
INTERNAL MEDICINE TEACHING SERVICE Attending Admission Note  Date: 08/31/2012  Patient name: John [name]  Medical record number: [id number]  Date of birth: [birth year]    I have seen and evaluated [name] and discussed their care with the Residency Team.  41 yr. Old male w/ hx barrett's esophagus, HTN, OSA, presented with cough, sore throat. He states he's been feeling malaise since July 4th. Admits to little to no improvement on oral antibiotic therapy after he was diagnosed outpatient with CAP. He states the first antibiotic given, doxycycline, caused pain and difficulty swallowing. He saw his GI physician who prescribed him fluconazole and asked him to stop doxy. He was then given Levaquin which did not improve his symptoms. He is noted to have acute hypoxic respiratory failure with CAP. He has bilateral nodules/bilateral hazy infiltrates.  He is afebrile and hemodynamically stable. On exam, he has mild exp wheeze.  He is noted to have hypoxia on ambulation. Change Abx to IV Rocephin and IV azithromycin. Place on BID PPI due to likely pill esophagitis. Monitor for any clinical deterioration. Jonah Blue, DO 7/18/20142:48 PM

## 2012-08-31 NOTE — Progress Notes (Signed)
Patient would prefer not to take the heparin injections. States that he will ambulate as often as needed to ensure he does not develop any DVT's. Will continue to monitor.  John [name] J. Lendell Caprice RN BSN

## 2012-09-01 DIAGNOSIS — G4733 Obstructive sleep apnea (adult) (pediatric): Secondary | ICD-10-CM

## 2012-09-01 LAB — COMPREHENSIVE METABOLIC PANEL
Albumin: 3.7 g/dL (ref 3.5–5.2)
Alkaline Phosphatase: 141 U/L — ABNORMAL HIGH (ref 39–117)
BUN: 11 mg/dL (ref 6–23)
Calcium: 9 mg/dL (ref 8.4–10.5)
Creatinine, Ser: 1.32 mg/dL (ref 0.50–1.35)
GFR calc Af Amer: 77 mL/min — ABNORMAL LOW (ref >90)
Glucose, Bld: 99 mg/dL (ref 70–99)
Total Protein: 6.6 g/dL (ref 6.0–8.3)

## 2012-09-01 LAB — CBC WITH DIFFERENTIAL/PLATELET
Lymphocytes Relative: 41 % (ref 12–46)
Lymphs Abs: 2.6 10*3/uL (ref 0.7–4.0)
MCV: 88.4 fL (ref 78.0–100.0)
Neutrophils Relative %: 48 % (ref 43–77)
Platelets: 376 10*3/uL (ref 150–400)
RBC: 4.49 MIL/uL (ref 4.22–5.81)
WBC: 6.5 10*3/uL (ref 4.0–10.5)

## 2012-09-01 MED ORDER — SUCRALFATE 1 GM/10ML PO SUSP: 1.0000 g | Freq: Three times a day (TID) | ORAL | Status: DC

## 2012-09-01 MED ORDER — ACETAMINOPHEN 500 MG PO TABS
500.0000 mg | ORAL_TABLET | Freq: Once | ORAL | Status: AC
  Administered 2012-09-01: 500 mg via ORAL
  Filled 2012-09-01: qty 1

## 2012-09-01 MED ORDER — SODIUM CHLORIDE 0.9 % IV SOLN: INTRAVENOUS | Status: DC

## 2012-09-01 MED ORDER — AZITHROMYCIN 250 MG PO TABS: 250.0000 mg | ORAL_TABLET | Freq: Every day | ORAL | Status: DC

## 2012-09-01 NOTE — Discharge Summary (Signed)
Name: John [name] MRN: [id number] DOB: 13-Jun-1970 42 y.o. PCP: No primary provider on file.  Date of Admission: 08/30/2012  4:30 AM Date of Discharge: 09/01/2012 Attending Physician: Jonah Blue, DO  Discharge Diagnosis: Principal Problem:   CAP (community acquired pneumonia) Active Problems:   Pill esophagitis   Hypoxia   OSA (obstructive sleep apnea)  Discharge Medications:   Medication List    STOP taking these medications       CAMBIA PO     lamivudine 100 MG tablet  Commonly known as:  EPIVIR     levofloxacin 500 MG tablet  Commonly known as:  LEVAQUIN      TAKE these medications       azithromycin 250 MG tablet  Commonly known as:  ZITHROMAX  Take 1 tablet (250 mg total) by mouth daily.     baclofen 10 MG tablet  Commonly known as:  LIORESAL  Take 10 mg by mouth 3 (three) times daily as needed (for muscle spasms).     esomeprazole 40 MG capsule  Commonly known as:  NEXIUM  Take 40 mg by mouth daily before breakfast.     fluconazole 100 MG tablet  Commonly known as:  DIFLUCAN  Take 100 mg by mouth daily.     guaiFENesin 600 MG 12 hr tablet  Commonly known as:  MUCINEX  Take 600 mg by mouth 2 (two) times daily.     lisinopril 10 MG tablet  Commonly known as:  PRINIVIL,ZESTRIL  Take 10 mg by mouth daily.     sucralfate 1 GM/10ML suspension  Commonly known as:  CARAFATE  Take 10 mLs (1 g total) by mouth 4 (four) times daily -  with meals and at bedtime.        Disposition and follow-up:   John [name] was discharged from [facility] in Good condition.  At the hospital follow up visit please address:  1.  Improvement of respiratory symptoms/assess need for O2  2.  Labs / imaging needed at time of follow-up: CXR 6 weeks from 08/30/12; if patient establishes care at [facility], he will need further work up of his renal dysfunction - unclear if acute or chronic; follow up blood cultures.   Follow-up Appointments:       Follow-up Information   Follow up with Gust Rung, DO. Schedule an appointment as soon as possible for a visit in 1 week. (hospital follow up)    Contact information:   [address]  [place] [address] [phone]       Discharge Instructions: Discharge Orders   Future Orders Complete By Expires     Call MD for:  difficulty breathing, headache or visual disturbances  As directed     Call MD for:  extreme fatigue  As directed     Call MD for:  persistant dizziness or light-headedness  As directed     Call MD for:  persistant nausea and vomiting  As directed     Call MD for:  temperature >100.4  As directed     Diet - low sodium heart healthy  As directed     Discharge instructions  As directed     Comments:      -Please start taking azithromycin 250mg  daily (starting on the day of discharge) for 4 days total  -You may continue carafate as needed to help with your throat pain. -You may use your oxygen at home as needed for shortness of breath, but  if your symptoms worsen instead of improve, seek immediate medical attention  -We apologize for all of the inconveniences during your hospitalization.  Your discharge paperwork tells you to stop taking lamivudine, but we are aware that you are NOT taking this medication.  We have documented that your are NOT taking this medication, and instead you are taking lamictal.  Lamivudine was never ordered for you during this hospitalization.    Increase activity slowly  As directed        Procedures Performed:  Dg Neck Soft Tissue 08/30/2012   *RADIOLOGY REPORT*  Clinical Data: Sore throat; mid chest discomfort.  Cough.  NECK SOFT TISSUES - 1+ VIEW  Comparison: None.  Findings: The nasopharynx, oropharynx and hypopharynx are grossly unremarkable in appearance.  The epiglottis remains normal in thickness.  The aryepiglottic folds are grossly unremarkable.  The proximal trachea is normal in caliber.  Prevertebral soft tissues are within  normal limits.  No suspicious radiopaque foreign bodies are identified.  No acute osseous abnormalities are seen.  Vertebral bodies demonstrate normal height and alignment; intervertebral disc spaces are preserved.  The visualized paranasal sinuses and mastoid air cells are well-aerated.  IMPRESSION: Unremarkable radiograph of the soft tissues of the neck.   Original Report Authenticated By: Tonia Ghent, M.D.   Dg Chest 2 View 08/30/2012   *RADIOLOGY REPORT*  Clinical Data: Sore throat; mid chest discomfort and cough.  CHEST - 2 VIEW  Comparison: None.  Findings: The lungs are well-aerated.  Peribronchial thickening is noted.  There is no evidence of focal opacification, pleural effusion or pneumothorax.  The heart is normal in size; the mediastinal contour is within normal limits.  No acute osseous abnormalities are seen.  IMPRESSION: Peribronchial thickening noted; lungs otherwise clear.   Original Report Authenticated By: Tonia Ghent, M.D.   Ct Angio Chest Pe W/cm &/or Wo Cm 08/30/2012   *RADIOLOGY REPORT*  Clinical Data: Short of breath, hypoxia concern for pulmonary embolism  CT ANGIOGRAPHY CHEST  Technique:  Multidetector CT imaging of the chest using the standard protocol during bolus administration of intravenous contrast. Multiplanar reconstructed images including MIPs were obtained and reviewed to evaluate the vascular anatomy.  Contrast: 75mL OMNIPAQUE IOHEXOL 350 MG/ML SOLN  Comparison: Chest radiograph 08/30/2012  Findings: There are no filling defects within the pulmonary arteries to suggest acute pulmonary embolism.  No acute findings aorta great vessels.  No pericardial fluid.  Esophagus is normal.  No axillary supraclavicular adenopathy.  No mediastinal adenopathy.  There are multiple fine nodules within the left or right lower lobe which appeared to be in an air space pattern.  Ground-glass opacities in the upper lobes.  There is some potential early consolidation in the left lower lobe  (image 76).  Limited view of the upper abdomen is unremarkable.  Limited view of the skeleton is unremarkable.  The  IMPRESSION:  1.  No evidence of acute pulmonary embolism. 2.  Bilateral fine nodules in an air space pattern suggests diffuse pulmonary infection or inflammation. Consider atypical pulmonary infections.   Original Report Authenticated By: Genevive Bi, M.D.    Admission HPI:  The patient is a 42 YO man with PMH of Barrett's esophagus, OSA, HTN who is presenting today for a cough and sore throat. He states that on July 4th he starting feeling poorly and felt that he had a cold. He states that he went to an urgent care and was told he had a pneumonia based on a chest x-ray. He was given  prescription for doxycycline and started taking it. He then developed severe throat pain with this medication and stopped taking it. He again presented to the clinic and was told that he needed a different medication. He was then prescribed levaquin and started taking it the Saturday prior to admission (5 days pta). He states that his feelings of cough and SOB were improving with this medicine. He was felt like he had some fevers with this illness and those were also improving with the medicine. Overall his symptoms were improved with the exception of some night time cough and sore throat. So he went to his GI doctor (Dr. Elnoria Howard) yesterday and was given a prescription for fluconazole as he was told he might have ulcers and fungal infection. He started taking that and has not noticed a difference. He is currently having a sensation of obstruction in his throat and has self limited to liquid diet the last week or so. He does not smoke or drink alcohol or do other drugs. He does get migraines and has had some over the past two weeks. He has some chronic neck stiffness which is unchanged recently and he is able to move his neck without pain. He is not having abdominal pain, nausea, vomiting, diarrhea, constipation. He  is not having leg swelling and has not been immobilized in the last several weeks. He has not had any recent travel. He also has OSA and has been unable to use the mask due to his cough recently. He does also have high blood pressure and takes lisinopril for that which he has been taking throughout this episode.   Admission Physical Exam:  Blood pressure 121/70, pulse 69, temperature 98.4 F (36.9 C), temperature source Oral, resp. rate 16, SpO2 94.00%.  General: resting in bed, pleasant, bearded  HEENT: PERRL, EOMI, no scleral icterus, no erythema in the nostrils, some mild erythema in the throat but not inflammation or swelling or discharge  Cardiac: RRR, no rubs, murmurs or gallops  Pulm: clear to auscultation bilaterally, moving normal volumes of air, some mild inspiratory roughness which clears with cough  Abd: soft, nontender, nondistended, BS present  Ext: warm and well perfused, no pedal edema, no calf swelling or tenderness  Neuro: alert and oriented X3, cranial nerves II-XII grossly intact   Admission Lab results:   Basic Metabolic Panel:   08/30/12 0555   NA  137   K  3.8   CL  100   GLUCOSE  99   BUN  12   CREATININE  1.40*   Bicarb 28   CBC:   08/30/12 0535  08/30/12 0555   WBC  8.3  --   HGB  15.2  14.6   HCT  42.1  43.0   MCV  88.1  --   PLT  385  --      Hospital Course by problem list: #CAP: At admission, he was continued on levofloxacin (had already completed 5 days), but on day after admission, he was transitioned to ceftriaxone and azithromycin after review of CXR was concerning for b/l nodules/bilateral hazy infiltrates.  At admission, he had desaturated to 87% on ambulation.  On day of discharge, he saturated 92-93% on RA on ambulation (116ft) and was asymptomatic.  He was discharged with a course of azithromycin to complete 5d total.  He will need repeat CXR in 6 weeks to assure resolution.  HIV negative. Urine strep & legionella negative.  Blood cx  without growth to date.  #Pill  Esophagitis: Patient experienced pill esophagitis after treatment with doxycycline for 3d for outpatient mgmt of PNA.  Symptoms improved by hospital discharge with carafate TIDWC.  This was continued at discharge.  He is also completing a course of fluconazole per Dr. Elnoria Howard, and will follow up with Dr. Elnoria Howard as already scheduled.  During hospitalization, he was given a dysphagia 3 diet for comfort.  #Kidney Insufficiency: Unclear if acute or chronic, patient does report taking diclofenac outpatient.  His Cr ranged from 1.25-1.40.  He was instructed to avoid nephrotoxic meds such as diclofenac, and will need further work up outpatient.   #Chronic HA: Patient takes lamictal 200mg  qHS outpatient for HA prevention (he is followed by a neurologist).  There was a medication error during med reconciliation, and lamivudine was placed on his medication list instead of lamotrigine.  Lamivudine was never ordered during hospitalization.  Patient was upset by the medication error, or specifically that he had to miss a dose of lamictal.  We apologized, assured him that medication was never ordered or given, and will enter as a "near miss" in the safety portal.    #OSA: Patient reports h/o OSA and is on CPAP at home.  CPAP was initially held due to his cough.  He was upset that CPAP was never ordered, and we explained this was only because he was coughing, but can be restarted.  He has O2 and CPAP at home.   Of note, during hospitalization, patient did have some concerns about his nursing staff/techs on the floor, which was discussed thoroughly with the intern, resident and attending on call.  Patient felt that he was not given sufficient knowledge about his condition from the treatment team.  Treatment plan was reviewed on day after admission and on day of discharge.  Patient agreeable to follow up in clinic.   Discharge Vitals:   BP 106/64  Pulse 66  Temp(Src) 98.2 F (36.8 C) (Oral)   Resp 18  Ht 5\' 8"  (1.727 m)  Wt 185 lb (83.915 kg)  BMI 28.14 kg/m2  SpO2 95%  Discharge Labs:  Results for orders placed during the hospital encounter of 08/30/12 (from the past 24 hour(s))  CBC WITH DIFFERENTIAL     Status: None   Collection Time    09/01/12  5:15 AM      Result Value Range   WBC 6.5  4.0 - 10.5 K/uL   RBC 4.49  4.22 - 5.81 MIL/uL   Hemoglobin 13.7  13.0 - 17.0 g/dL   HCT 78.2  95.6 - 21.3 %   MCV 88.4  78.0 - 100.0 fL   MCH 30.5  26.0 - 34.0 pg   MCHC 34.5  30.0 - 36.0 g/dL   RDW 08.6  57.8 - 46.9 %   Platelets 376  150 - 400 K/uL   Neutrophils Relative % 48  43 - 77 %   Neutro Abs 3.1  1.7 - 7.7 K/uL   Lymphocytes Relative 41  12 - 46 %   Lymphs Abs 2.6  0.7 - 4.0 K/uL   Monocytes Relative 8  3 - 12 %   Monocytes Absolute 0.5  0.1 - 1.0 K/uL   Eosinophils Relative 3  0 - 5 %   Eosinophils Absolute 0.2  0.0 - 0.7 K/uL   Basophils Relative 1  0 - 1 %   Basophils Absolute 0.0  0.0 - 0.1 K/uL  COMPREHENSIVE METABOLIC PANEL     Status: Abnormal   Collection Time  09/01/12  5:15 AM      Result Value Range   Sodium 140  135 - 145 mEq/L   Potassium 3.6  3.5 - 5.1 mEq/L   Chloride 103  96 - 112 mEq/L   CO2 29  19 - 32 mEq/L   Glucose, Bld 99  70 - 99 mg/dL   BUN 11  6 - 23 mg/dL   Creatinine, Ser 8.11  0.50 - 1.35 mg/dL   Calcium 9.0  8.4 - 91.4 mg/dL   Total Protein 6.6  6.0 - 8.3 g/dL   Albumin 3.7  3.5 - 5.2 g/dL   AST 21  0 - 37 U/L   ALT 17  0 - 53 U/L   Alkaline Phosphatase 141 (*) 39 - 117 U/L   Total Bilirubin 0.3  0.3 - 1.2 mg/dL   GFR calc non Af Amer 66 (*) >90 mL/min   GFR calc Af Amer 77 (*) >90 mL/min    Signed: Belia Heman, MD 09/01/2012, 2:18 PM   Time Spent on Discharge: 35 minutes

## 2012-09-01 NOTE — Progress Notes (Signed)
Utilization review complete 

## 2012-09-01 NOTE — Progress Notes (Signed)
Subjective: Doing well this morning. SOB improved. Decreased pain with swallowing and tolerated diet well today.  Reports that he does use CPAP at night for OSA at home.  Objective: Vital signs in last 24 hours: Filed Vitals:   08/31/12 0520 08/31/12 0745 08/31/12 2101 09/01/12 0445  BP: 103/64  119/76 106/64  Pulse: 72  82 66  Temp: 99 F (37.2 C)  99.1 F (37.3 C) 98.2 F (36.8 C)  TempSrc: Oral  Oral Oral  Resp: 18  18 18   Height:  5\' 8"  (1.727 m)    Weight:  185 lb (83.915 kg)    SpO2: 92%  90% 95%   Weight change: 6.4 oz (0.181 kg)  Intake/Output Summary (Last 24 hours) at 09/01/12 1028 Last data filed at 08/31/12 1525  Gross per 24 hour  Intake    300 ml  Output      0 ml  Net    300 ml   General: resting in bed Cardiac: tachycardic (101 on pulse ox monitor), no rubs, murmurs or gallops Pulm: O2 Sat 91-92% on pulse ox; clear to auscultation bilaterally, moving normal volumes of air Abd: soft, nontender, nondistended, BS normoactive Ext: warm and well perfused, no pedal edema Neuro: alert and oriented X3, cranial nerves II-XII grossly intact  Lab Results: Basic Metabolic Panel:  Recent Labs Lab 08/31/12 0550 09/01/12 0515  NA 139 140  K 3.9 3.6  CL 106 103  CO2 29 29  GLUCOSE 102* 99  BUN 10 11  CREATININE 1.34 1.32  CALCIUM 8.6 9.0   Liver Function Tests:  Recent Labs Lab 08/30/12 1105 09/01/12 0515  AST 24 21  ALT 22 17  ALKPHOS 172* 141*  BILITOT 0.7 0.3  PROT 7.3 6.6  ALBUMIN 4.1 3.7   CBC:  Recent Labs Lab 08/30/12 0535 08/30/12 0555 09/01/12 0515  WBC 8.3  --  6.5  NEUTROABS  --   --  3.1  HGB 15.2 14.6 13.7  HCT 42.1 43.0 39.7  MCV 88.1  --  88.4  PLT 385  --  376    Micro Results: Recent Results (from the past 240 hour(s))  CULTURE, BLOOD (ROUTINE X 2)     Status: None   Collection Time    08/30/12 11:00 AM      Result Value Range Status   Specimen Description BLOOD LEFT ANTECUBITAL   Final   Special Requests  BOTTLES DRAWN AEROBIC ONLY 10CC   Final   Culture  Setup Time 08/30/2012 15:29   Final   Culture     Final   Value:        BLOOD CULTURE RECEIVED NO GROWTH TO DATE CULTURE WILL BE HELD FOR 5 DAYS BEFORE ISSUING A FINAL NEGATIVE REPORT   Report Status PENDING   Incomplete  CULTURE, BLOOD (ROUTINE X 2)     Status: None   Collection Time    08/30/12 11:05 AM      Result Value Range Status   Specimen Description BLOOD ARM LEFT   Final   Special Requests BOTTLES DRAWN AEROBIC ONLY 10CC   Final   Culture  Setup Time 08/30/2012 15:28   Final   Culture     Final   Value:        BLOOD CULTURE RECEIVED NO GROWTH TO DATE CULTURE WILL BE HELD FOR 5 DAYS BEFORE ISSUING A FINAL NEGATIVE REPORT   Report Status PENDING   Incomplete   Medications: I have reviewed the patient's current  medications. Scheduled Meds: . azithromycin  500 mg Intravenous Q24H  . cefTRIAXone (ROCEPHIN)  IV  1 g Intravenous Q24H  . heparin  5,000 Units Subcutaneous Q8H  . lamoTRIgine  200 mg Oral QHS  . pantoprazole  40 mg Oral BID  . sucralfate  1 g Oral TID WC & HS   Continuous Infusions:  PRN Meds:.acetaminophen-codeine, ondansetron, ondansetron Assessment/Plan:  # AKI (acute kidney injury) : Stable without known baseline  -Patient presented with Cr of 1.4, noted to have baseline of 1 (2 years ago). History of poor PO intake the days prior to admission.  Patient received CT angio and is at risk for CIN. Will continue to monitor renal function.  -FeNa 1.19%  -Give NS @ 100cc/h x 10h (patient had received IVF hydration which was d/c yesterday, but will give one more liter)  # CAP (community acquired pneumonia) : HIV and urine strep/legionella neg  -Cont IV Ceftriaxone and IV azithromycin  -O2 Sat with ambulation -BCx 7/17 NGTD   # Pill esophagitis Improved -Patient reports irration started after taking doxycycline for 3 days.  -Recently saw GI who prescribed fluconazole.  -Cont PPI to BID.  -Dys 3 diet.  -Carafate  with meals.   #OSA: start cpap qHS  # DVT PPx: Heparin   Dispo: Disposition is deferred at this time, awaiting improvement of current medical problems.  Anticipated discharge in approximately 1-2 day(s).   The patient does have a current PCP (No primary provider on file.) and does not need an [facility] hospital follow-up appointment after discharge.  The patient does not have transportation limitations that hinder transportation to clinic appointments.  .Services Needed at time of discharge: Y = Yes, Blank = No PT:   OT:   RN:   Equipment:   Other:     LOS: 2 days   Belia Heman, MD 09/01/2012, 10:28 AM

## 2012-09-01 NOTE — Progress Notes (Addendum)
  Date: 09/01/2012  Patient name: John [name]  Medical record number: [id number]  Date of birth: 08/30/1970   This patient has been seen and the plan of care was discussed with the house staff. Please see their note for complete details. I concur with their findings with the following additions/corrections:  Patient seen today. He feels better and has little cough. He denies any SOB. I personally ambulated him through the hallway about 100 feet with O2 sats 92-93% on RA, he was asymptomatic.  He had some concerns about his nursing staff/tachs on the floor which I discussed personally with the charge RN on 5W.  Furthermore, he stated he felt he did not have sufficient knowledge about his condition from treatment team. I discussed our findings and plan in detail with him (I personally also discussed this with him the day after admission).  I asked if he had other concerns, for which he responded "just little things".   I apologized for any misunderstandings between him and nursing staff and also our treatment team.  He has no current PCP, and I offered him follow up with our clinic, which he agreed to. He can finish course of azithromycin 250 mg po daily for 3 more days. He will need a repeat CXR in 6 weeks to assure resolution.  He will need to follow up with Dr. Elnoria Howard his gastroenterologist as scheduled, he had recent pill esophagitis from doxycycline. He is to continue his home PPI. He will need repeat BMP in clinic to reevaluate his renal function, as I don't believe he has a hx of CKD. He does take diclofenac for headaches as needed and I advised him of risk of nephropathy from use of diclofenac chronically. He wishes to continue this. He will need close follow up in clinic.  Medically stable for discharge.   Jonah Blue, DO 09/01/2012, 2:18 PM

## 2012-09-01 NOTE — Progress Notes (Signed)
09/01/12 Patient to be discharged home today. IV site removed, discharge instructions reviewed with patient and family.

## 2012-09-01 NOTE — Progress Notes (Signed)
Patient is complaining of a headache would like to take some Tylenol but not the elixir with codeine. Provider on call was notified. MD. Delane Ginger placed a one time order for Tylenol 500mg , will administer and continue to monitor.   Mimi Debellis J. Lendell Caprice RN MSN

## 2012-09-02 ENCOUNTER — Telehealth: Payer: Self-pay | Admitting: Internal Medicine

## 2012-09-02 NOTE — Telephone Encounter (Signed)
Called patient at his home number to follow up on his discharge and to ask how he is feeling. He states he is feeling better and was able to fill his antibiotic. He is tolerating this well. He had some cough this morning but this is improving. He states he feels a bit weak in his legs but attributes this to recently being sick.  He states the charge nurse did discuss with him any concerns he had about nursing/tech services provides within his hospitalization.  He had no further questions. He will be following up with [name] in clinic in the next week. He has our clinic number, but he should be receiving a call from our front desk to schedule a follow up appointment. He had no further questions for me.

## 2012-09-03 NOTE — Discharge Summary (Signed)
  Date: 09/03/2012  Patient name: John [name]  Medical record number: [id number]  Date of birth: 01/06/1971   This patient has been discussed with the house staff. Please see their note for complete details. I concur with their findings and plan.  Jonah Blue, DO 09/03/2012, 8:44 PM

## 2012-09-03 NOTE — Care Management Note (Signed)
    Page 1 of 1   09/03/2012     11:06:05 AM   CARE MANAGEMENT NOTE 09/03/2012  Patient:  John [name],John [name]   Account Number:  [id number]  Date Initiated:  09/03/2012  Documentation initiated by:  Letha Cape  Subjective/Objective Assessment:   dx aki  admit as observation- lives with sign other. pta indep.     Action/Plan:   Anticipated DC Date:  09/01/2012   Anticipated DC Plan:  HOME/SELF CARE      DC Planning Services  CM consult      Choice offered to / List presented to:             Status of service:  Completed, signed off Medicare Important Message given?   (If response is "NO", the following Medicare IM given date fields will be blank) Date Medicare IM given:   Date Additional Medicare IM given:    Discharge Disposition:  HOME/SELF CARE  Per UR Regulation:  Reviewed for med. necessity/level of care/duration of stay  If discussed at Long Length of Stay Meetings, dates discussed:    Comments:  09/03/12 11:05 Letha Cape RN, BSN [phone] patient dc to home , no needs anticipated.

## 2012-09-05 LAB — CULTURE, BLOOD (ROUTINE X 2)

## 2012-09-13 ENCOUNTER — Encounter: Payer: Self-pay | Admitting: Internal Medicine

## 2012-09-13 ENCOUNTER — Ambulatory Visit (INDEPENDENT_AMBULATORY_CARE_PROVIDER_SITE_OTHER): Payer: 59 | Admitting: Internal Medicine

## 2012-09-13 VITALS — BP 116/74 | HR 71 | Temp 97.8°F | Ht 68.5 in | Wt 189.5 lb

## 2012-09-13 DIAGNOSIS — K208 Other esophagitis without bleeding: Secondary | ICD-10-CM

## 2012-09-13 DIAGNOSIS — J189 Pneumonia, unspecified organism: Secondary | ICD-10-CM

## 2012-09-13 DIAGNOSIS — T50905A Adverse effect of unspecified drugs, medicaments and biological substances, initial encounter: Secondary | ICD-10-CM

## 2012-09-13 DIAGNOSIS — G4733 Obstructive sleep apnea (adult) (pediatric): Secondary | ICD-10-CM

## 2012-09-13 DIAGNOSIS — N179 Acute kidney failure, unspecified: Secondary | ICD-10-CM

## 2012-09-13 DIAGNOSIS — N289 Disorder of kidney and ureter, unspecified: Secondary | ICD-10-CM

## 2012-09-13 LAB — COMPREHENSIVE METABOLIC PANEL
ALT: 20 U/L (ref 0–53)
Albumin: 4.6 g/dL (ref 3.5–5.2)
Alkaline Phosphatase: 101 U/L (ref 39–117)
CO2: 28 mEq/L (ref 19–32)
Glucose, Bld: 92 mg/dL (ref 70–99)
Potassium: 4.3 mEq/L (ref 3.5–5.3)
Sodium: 139 mEq/L (ref 135–145)
Total Bilirubin: 0.6 mg/dL (ref 0.3–1.2)
Total Protein: 7 g/dL (ref 6.0–8.3)

## 2012-09-13 NOTE — Assessment & Plan Note (Addendum)
Cr on discharge improved to 1.32 down from 1.4 on admission. Unclear if acute or chronic.  No urinary complaints at this time.   Lab Results  Component Value Date   CREATININE 1.32 09/01/2012   -f/u cmet today -will need to have pcp in future follow and work up as necessary

## 2012-09-13 NOTE — Assessment & Plan Note (Signed)
On CPAP at home. ?

## 2012-09-13 NOTE — Patient Instructions (Signed)
Thank you for taking the time to come in today for your hospital follow up.  I am glad you are feeling better.    We will get one more lab test today to monitor your renal function and also your liver function  You may try OTC cough syrup to see if that may help with your cough at all  Please follow up with Dr. Elnoria Howard next week for your endoscopy  I hope you continue to feel better.  If your symptoms worsen including fever, chills, nausea, vomiting, diarrhea, abdominal pain, worsening shortness of breath or cough, or chest pain please let your pcp office know or go to the emergency room if severe

## 2012-09-13 NOTE — Progress Notes (Signed)
Subjective:   Patient ID: John [name] male   DOB: [birth year] 42 y.o.   MRN: [id number]  HPI: Mr.John [name] is a 43 y.o. white male with PMH of barrett's esophagus, OSA on CPAP, chronic headaches, and HTN presenting to [facility] this morning for hospital follow up.  [name] was recently discharged for the hospital on 09/01/12 for CAP and has completed his full course of azithromycin.  He feels much better since admission but still has an intermittent occasional productive cough with white sputum.  He denies SOB but does have mild DOE and denies any fever, chills, or chest pain at this time.  He continues to have pain from his pill esophagitis and did follow up with Dr. Elnoria Howard since discharge.  He will have an upper endoscopy next week.  He has still not decided where he wishes to establish primary care, whether in our office or close to his home, but he said he will let us know.  In the meantime, I will schedule his 6 week follow up cxr to take place at [facility] unless he wishes to go elsewhere and he will notify us at that time.    Past Medical History  Diagnosis Date  . Barrett esophagus   . Heart murmur     at birth  . Hypertension   . Headache(784.0)     chronic  . Sleep apnea     uses cpap  . Pneumonia 08/2012  . GERD (gastroesophageal reflux disease)   . H/O hiatal hernia    Current Outpatient Prescriptions  Medication Sig Dispense Refill  . baclofen (LIORESAL) 10 MG tablet Take 10 mg by mouth 3 (three) times daily as needed (for muscle spasms).      Marland Kitchen esomeprazole (NEXIUM) 40 MG capsule Take 40 mg by mouth daily before breakfast.      . lamoTRIgine (LAMICTAL) 200 MG tablet Take 200 mg by mouth daily.      Marland Kitchen lisinopril (PRINIVIL,ZESTRIL) 10 MG tablet Take 10 mg by mouth daily.      . sucralfate (CARAFATE) 1 GM/10ML suspension Take 10 mLs (1 g total) by mouth 4 (four) times daily -  with meals and at bedtime.  420 mL  0   No current facility-administered medications for this  visit.   No family history on file. History   Social History  . Marital Status: Married    Spouse Name: N/A    Number of Children: N/A  . Years of Education: N/A   Social History Main Topics  . Smoking status: Never Smoker   . Smokeless tobacco: Never Used  . Alcohol Use: No  . Drug Use: No  . Sexually Active: None   Other Topics Concern  . None   Social History Narrative  . None   Review of Systems:  Constitutional:  Denies fever, chills, diaphoresis, appetite change and fatigue.   HEENT:  Sore throat, odynophagia.  Denies congestion, rhinorrhea, sneezing, neck pain   Respiratory:  Cough and DOE.  Denies SOB, and wheezing.   Cardiovascular:  Denies chest pain, palpitations, and leg swelling.   Gastrointestinal:  Denies nausea, vomiting, abdominal pain, diarrhea, constipation, blood in stool and abdominal distention.   Genitourinary:  Denies dysuria, urgency, frequency, hematuria, and difficulty urinating.   Musculoskeletal:  Denies myalgias, back pain, joint swelling, arthralgias and gait problem.   Skin:  Denies pallor, rash and wound.   Neurological:  Chronic headaches.  Denies dizziness, seizures, syncope, weakness, light-headedness, numbness.   Objective:  Physical Exam: Filed Vitals:   09/13/12 0949  BP: 116/74  Pulse: 71  Temp: 97.8 F (36.6 C)  TempSrc: Oral  Height: 5' 8.5" (1.74 m)  Weight: 189 lb 8 oz (85.957 kg)  SpO2: 95%   Vitals reviewed. General: sitting in chair, NAD HEENT: PERRL, EOMI, thrush on tongue Cardiac: RRR, no rubs, murmurs or gallops Pulm: b/l mild crackles right base Abd: soft, nontender, nondistended, BS present Ext: warm and well perfused, no pedal edema, +2DP B/L Neuro: alert and oriented X3, cranial nerves II-XII grossly intact, strength and sensation to light touch equal in bilateral upper and lower extremities  Assessment & Plan:  Discussed with Dr. Meredith Pel F/u cmet

## 2012-09-13 NOTE — Assessment & Plan Note (Signed)
Discharged from hospital 09/01/12.  Completed course of azithromycin.  Continues to have occasional productive cough with green sputum.  SOB much improved, mild DOE.  Has oxygen at home prn but has not been needing it recently.    -will try otc cough suppressant to see if that helps with cough, needs liquid form in stead of pills due to esophagitis -f/u cxr scheduled for October 11, 2012 -has not decided where he wishes to establish primary care yet

## 2012-09-13 NOTE — Assessment & Plan Note (Signed)
Endoscopy scheduled for next week with Dr. Elnoria Howard

## 2012-09-14 ENCOUNTER — Encounter: Payer: Self-pay | Admitting: Internal Medicine

## 2012-09-14 NOTE — Progress Notes (Signed)
Case discussed with Dr. Qureshi soon after the resident saw the patient.  We reviewed the resident's history and exam and pertinent patient test results.  I agree with the assessment, diagnosis, and plan of care documented in the resident's note. 

## 2012-10-11 ENCOUNTER — Inpatient Hospital Stay (HOSPITAL_COMMUNITY): Admission: RE | Admit: 2012-10-11 | Payer: 59 | Source: Ambulatory Visit

## 2012-10-16 ENCOUNTER — Encounter: Payer: Self-pay | Admitting: Internal Medicine

## 2012-10-16 ENCOUNTER — Ambulatory Visit (HOSPITAL_COMMUNITY)
Admission: RE | Admit: 2012-10-16 | Discharge: 2012-10-16 | Disposition: A | Discharge status: Home / Self Care | Payer: 59 | Source: Ambulatory Visit | Attending: Internal Medicine | Admitting: Internal Medicine

## 2012-10-16 DIAGNOSIS — J189 Pneumonia, unspecified organism: Secondary | ICD-10-CM

## 2012-10-16 DIAGNOSIS — Z8701 Personal history of pneumonia (recurrent): Secondary | ICD-10-CM | POA: Insufficient documentation

## 2012-10-17 ENCOUNTER — Encounter: Payer: Self-pay | Admitting: Internal Medicine

## 2012-11-12 ENCOUNTER — Ambulatory Visit (HOSPITAL_COMMUNITY)
Admission: RE | Admit: 2012-11-12 | Discharge: 2012-11-12 | Disposition: A | Discharge status: Home / Self Care | Payer: 59 | Source: Ambulatory Visit | Attending: Gastroenterology | Admitting: Gastroenterology

## 2012-11-12 ENCOUNTER — Encounter (HOSPITAL_COMMUNITY)
Admission: RE | Disposition: A | Discharge status: Home / Self Care | Payer: Self-pay | Source: Ambulatory Visit | Attending: Gastroenterology

## 2012-11-12 DIAGNOSIS — R1310 Dysphagia, unspecified: Secondary | ICD-10-CM | POA: Insufficient documentation

## 2012-11-12 DIAGNOSIS — I10 Essential (primary) hypertension: Secondary | ICD-10-CM | POA: Insufficient documentation

## 2012-11-12 DIAGNOSIS — K219 Gastro-esophageal reflux disease without esophagitis: Secondary | ICD-10-CM | POA: Insufficient documentation

## 2012-11-12 DIAGNOSIS — G4730 Sleep apnea, unspecified: Secondary | ICD-10-CM | POA: Insufficient documentation

## 2012-11-12 HISTORY — PX: ESOPHAGEAL MANOMETRY: SHX5429

## 2012-11-12 SURGERY — MANOMETRY, ESOPHAGUS
Anesthesia: Topical

## 2012-11-12 MED ORDER — LIDOCAINE VISCOUS 2 % MT SOLN
OROMUCOSAL | Status: AC
  Filled 2012-11-12: qty 15

## 2012-11-12 SURGICAL SUPPLY — 4 items
DRAPE UTILITY 15X26 W/TAPE STR (DRAPE) ×2 IMPLANT
GLOVE BIOGEL PI IND STRL 8 (GLOVE) ×1 IMPLANT
GLOVE BIOGEL PI INDICATOR 8 (GLOVE) ×1
GOWN PREVENTION PLUS LG XLONG (DISPOSABLE) ×2 IMPLANT

## 2012-11-13 ENCOUNTER — Encounter (HOSPITAL_COMMUNITY): Payer: Self-pay | Admitting: Gastroenterology

## 2012-11-13 NOTE — H&P (Signed)
Reason for Consult: Dysphaiga and GERD   John [name] HPI: This is a 42 year old male well-known to me for GERD complaints.  A few months ago he was started on doxycycline and this exacerbated his GERD.  Since that time he has not been back to normal.  Aggressive acid control was tried and he was emperically treated for Candida, which help with his situation, temporarily.  A repeat EGD was recently performed and there is no change with his Barrett's esophagus and no evidence of pill-induced esophagitis.  Because of his persistent symptoms, he is here today for a manometry.  The plan is to clear his esophageal motility for a surgical consultation.  Hopefully a Nissen fundoplication will help with his symptoms.  Past Medical History  Diagnosis Date  . Barrett esophagus   . Heart murmur     at birth  . Hypertension   . Headache(784.0)     chronic  . Sleep apnea     uses cpap  . Pneumonia 08/2012  . GERD (gastroesophageal reflux disease)   . H/O hiatal hernia     Past Surgical History  Procedure Laterality Date  . Appendectomy    . Upper gi endoscopy  2013  . Esophageal manometry N/A 11/12/2012    Procedure: ESOPHAGEAL MANOMETRY (EM);  Surgeon: Theda Belfast, MD;  Location: [facility];  Service: Endoscopy;  Laterality: N/A;    No family history on file.  Social History:  reports that he has never smoked. He has never used smokeless tobacco. He reports that he does not drink alcohol or use illicit drugs.  Allergies:  Allergies  Allergen Reactions  . Doxycycline Other (See Comments)    Burning in throat  . Nitroglycerin Other (See Comments)    Severe headache    Medications: Scheduled: Continuous:  No results found for this or any previous visit (from the past 24 hour(s)).   No results found.  ROS:  As stated above in the HPI otherwise negative.  Height 5\' 8"  (1.727 m), weight 189 lb (85.73 kg).    PE: Not performed as this was an outpatient procedure conducted by  the nurses.  Assessment/Plan: 1) GERD. 2) Dysphagia.  Plan: 1) Manometry.  Keliah Harned D 11/13/2012, 3:57 PM

## 2012-12-14 ENCOUNTER — Ambulatory Visit (INDEPENDENT_AMBULATORY_CARE_PROVIDER_SITE_OTHER): Payer: 59 | Admitting: Surgery

## 2012-12-20 ENCOUNTER — Other Ambulatory Visit: Payer: Self-pay

## 2013-01-04 ENCOUNTER — Ambulatory Visit (INDEPENDENT_AMBULATORY_CARE_PROVIDER_SITE_OTHER): Payer: 59 | Admitting: Surgery

## 2013-01-24 ENCOUNTER — Encounter (INDEPENDENT_AMBULATORY_CARE_PROVIDER_SITE_OTHER): Payer: Self-pay | Admitting: General Surgery

## 2013-01-25 ENCOUNTER — Ambulatory Visit (INDEPENDENT_AMBULATORY_CARE_PROVIDER_SITE_OTHER): Payer: 59 | Admitting: Surgery

## 2013-01-25 ENCOUNTER — Encounter (INDEPENDENT_AMBULATORY_CARE_PROVIDER_SITE_OTHER): Payer: Self-pay | Admitting: Surgery

## 2013-01-25 VITALS — BP 122/80 | HR 72 | Temp 97.4°F | Resp 14 | Ht 68.0 in | Wt 197.2 lb

## 2013-01-25 DIAGNOSIS — K219 Gastro-esophageal reflux disease without esophagitis: Secondary | ICD-10-CM

## 2013-01-25 NOTE — Progress Notes (Signed)
Chief Complaint:  Gastroesophageal reflux   History of Present Illness:  John [name] is an 42 y.o. male referred by Dr. Elnoria Howard because of a several year history of acid reflux and heartburn. The patient has had a manometry which showed good motility continues to have problems with irritation and acid and at times dysphagia. He has a history of a hiatal hernia but has not had a recent upper GI series. He had pneumonia back in July it is unclear whether this is related to aspiration.  I described a Nissen fundoplication to him and his wife in some detail. I gave him a booklet on this to consider surgery. He has good questions regarding potential complications and failure and these questions were answered.  Past Medical History  Diagnosis Date  . Barrett esophagus   . Heart murmur     at birth  . Hypertension   . Headache(784.0)     chronic  . Sleep apnea     uses cpap  . Pneumonia 08/2012  . GERD (gastroesophageal reflux disease)   . H/O hiatal hernia   . Migraine   . Sinusitis   . BPH (benign prostatic hyperplasia)   . Fatty liver     Past Surgical History  Procedure Laterality Date  . Appendectomy    . Upper gi endoscopy  2013  . Esophageal manometry N/A 11/12/2012    Procedure: ESOPHAGEAL MANOMETRY (EM);  Surgeon: Theda Belfast, MD;  Location: [facility];  Service: Endoscopy;  Laterality: N/A;  . Nasal septum surgery      Current Outpatient Prescriptions  Medication Sig Dispense Refill  . baclofen (LIORESAL) 10 MG tablet Take 10 mg by mouth 3 (three) times daily as needed (for muscle spasms).      . Diclofenac Potassium (CAMBIA PO) Take by mouth.      . esomeprazole (NEXIUM) 40 MG capsule Take 40 mg by mouth daily before breakfast.      . lamoTRIgine (LAMICTAL) 200 MG tablet Take 200 mg by mouth daily.      Marland Kitchen lisinopril (PRINIVIL,ZESTRIL) 10 MG tablet Take 10 mg by mouth daily.      . Pseudoephedrine-Guaifenesin (MUCINEX D PO) Take by mouth.      . sucralfate  (CARAFATE) 1 GM/10ML suspension Take 10 mLs (1 g total) by mouth 4 (four) times daily -  with meals and at bedtime.  420 mL  0   No current facility-administered medications for this visit.   Doxycycline and Nitroglycerin Family History  Problem Relation Age of Onset  . Cancer Father     skin  . Cancer Paternal Aunt     breast  . Cancer Mother     pancreatic   Social History:   reports that he has never smoked. He has never used smokeless tobacco. He reports that he does not drink alcohol or use illicit drugs.   REVIEW OF SYSTEMS - PERTINENT POSITIVES ONLY: Review of systems is negative for problems with bleeding or easy bruisability. His GI is positive for the dysphagia heartburn. Denies any bloody bowel movements. Cardiology note chest pain or shortness of breath. Most the skeletal no arthropathies.  Physical Exam:   Blood pressure 122/80, pulse 72, temperature 97.4 F (36.3 C), resp. rate 14, height 5\' 8"  (1.727 m), weight 197 lb 3.2 oz (89.449 kg). Body mass index is 29.99 kg/(m^2).  Gen:  WDWN white male NAD  Neurological: Alert and oriented to person, place, and time. Motor and sensory function is grossly  intact  Head: Normocephalic and atraumatic.  Eyes: Conjunctivae are normal. Pupils are equal, round, and reactive to light. No scleral icterus.  Neck: Normal range of motion. Neck supple. No tracheal deviation or thyromegaly present.  Cardiovascular:  SR without murmurs or gallops.  No carotid bruits Respiratory: Effort normal.  No respiratory distress. No chest wall tenderness. Breath sounds normal.  No wheezes, rales or rhonchi.  Abdomen:  nontender to palpation. GU: Musculoskeletal: Normal range of motion. Extremities are nontender. No cyanosis, edema or clubbing noted Lymphadenopathy: No cervical, preauricular, postauricular or axillary adenopathy is present Skin: Skin is warm and dry. No rash noted. No diaphoresis. No erythema. No pallor. Pscyh: Normal mood and affect.  Behavior is normal. Judgment and thought content normal.   LABORATORY RESULTS: No results found for this or any previous visit (from the past 48 hour(s)).  RADIOLOGY RESULTS: No results found.  Problem List: Patient Active Problem List   Diagnosis Date Noted  . CAP (community acquired pneumonia) 08/30/2012  . AKI (acute kidney injury) 08/30/2012  . 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  . Hypoxia 08/30/2012  . Barrett esophagus 08/30/2012  . OSA (obstructive sleep apnea) 08/30/2012    Assessment & Plan: History and documentation for significant GERD over about a 10 year period. Will consider surgery have given him books to think about this. We'll get an upper GI series to assess size of his  hiatal hernia.    Matt B. Daphine Deutscher, MD, [facility] [phone] beeper [phone]  01/25/2013 2:05 PM

## 2013-01-25 NOTE — Patient Instructions (Signed)
Nissen Fundoplication Care After Please read the instructions outlined below and refer to this sheet for the next few weeks. These discharge instructions provide you with general information on caring for yourself after you leave the hospital. Your doctor may also give you specific instructions. While your treatment has been planned according to the most current medical practices available, unavoidable complications sometimes happen. If you have any problems or questions after discharge, please call your doctor. ACTIVITY  Take frequent rest periods throughout the day.  Take frequent walks throughout the day. This will help to prevent blood clots.  Continue to do your coughing and deep breathing exercises once you get home. This will help to prevent pneumonia.  No strenuous activities such as heavy lifting, pushing or pulling until after your follow-up visit with your doctor. Do not lift anything heavier than 10 pounds.  Talk with your caregiver about when you may return to work and your exercise routine.  You may shower 2 days after surgery. Pat incisions dry. Do not rub incisions with washcloth or towel.  Do not drive while taking prescription pain medication. NUTRITION  Continue with a liquid diet, or the diet you were directed to take, until your first follow-up visit with your surgeon.  Drink fluids (6-8 glasses a day).  Call your caregiver for persistent nausea (feeling sick to your stomach), vomiting, bloating or difficulty swallowing. ELIMINATION It is very important not to strain during bowel movements. If constipation should occur, you may:  Take a mild laxative (such as Milk of Magnesia).  Add fruit and bran to your diet.  Drink more fluids.  Call your caregiver if constipation is not relieved. FEVER If you feel feverish or have shaking chills, take your temperature. If it is 102 F (38.9 C) or above, call your caregiver. The fever may mean there is an infection. PAIN  CONTROL  If a prescription was given for a pain reliever, please follow your caregiver's directions.  Only take over-the-counter or prescription medicines for pain, discomfort, or fever as directed by your caregiver.  If the pain is not relieved by your medicine, becomes worse, or you have difficulty breathing, call your doctor. INCISION  It is normal for your cuts (incisions) from surgery to have a small amount of drainage for the first 1-2 days. Once the drainage has stopped, leave your incision(s) open to air.  Check your incision(s) and surrounding area daily for any redness, swelling, increased drainage or bleeding. If any of these are present or if the wound edges start to separate, call your doctor.  If you have small adhesive strips in place, they will peel and fall off. (If these strips are covered with a clear bandage, your doctor will tell you when to remove them.)  If you have staples, your caregiver will remove them at the follow-up appointment. Document Released: 09/24/2003 Document Revised: 04/25/2011 Document Reviewed: 12/28/2006 Exit[employer] Patient Information 2014 [employer].  

## 2013-02-08 ENCOUNTER — Ambulatory Visit
Admission: RE | Admit: 2013-02-08 | Discharge: 2013-02-08 | Disposition: A | Discharge status: Home / Self Care | Payer: 59 | Source: Ambulatory Visit | Attending: Surgery | Admitting: Surgery

## 2013-02-08 DIAGNOSIS — K219 Gastro-esophageal reflux disease without esophagitis: Secondary | ICD-10-CM

## 2013-02-11 ENCOUNTER — Telehealth (INDEPENDENT_AMBULATORY_CARE_PROVIDER_SITE_OTHER): Payer: Self-pay | Admitting: General Surgery

## 2013-02-11 NOTE — Telephone Encounter (Signed)
LMOM asking pt to return my call.  This is so that I may inform him that his DG UGI came back showing a small hiatal hernia, but no GERD and no other negative implications.

## 2013-02-18 NOTE — Telephone Encounter (Signed)
Pt returned call and was given update on DG results

## 2013-02-22 ENCOUNTER — Ambulatory Visit (INDEPENDENT_AMBULATORY_CARE_PROVIDER_SITE_OTHER): Payer: 59 | Admitting: Surgery

## 2013-02-22 ENCOUNTER — Encounter (INDEPENDENT_AMBULATORY_CARE_PROVIDER_SITE_OTHER): Payer: Self-pay | Admitting: Surgery

## 2013-02-22 VITALS — BP 120/80 | HR 72 | Temp 98.6°F | Resp 14 | Ht 68.0 in | Wt 196.0 lb

## 2013-02-22 DIAGNOSIS — K219 Gastro-esophageal reflux disease without esophagitis: Secondary | ICD-10-CM

## 2013-02-22 NOTE — Progress Notes (Signed)
Chief Complaint:  Gastroesophageal reflux   History of Present Illness:  John [name] is an 43 y.o. male referred by Dr. Benson Norway because of a several year history of acid reflux and heartburn. The patient has had a manometry which showed good motility continues to have problems with irritation and acid and at times dysphagia. He has a history of a hiatal hernia but has not had a recent upper GI series. He had pneumonia back in July it is unclear whether this is related to aspiration.  I described a Nissen fundoplication to him and his wife in some detail. I gave him a booklet on this to consider surgery. He has good questions regarding potential complications and failure and these questions were answered.  UGI reviewed and this showed a hiatus hernia but not reflux demonstrated.    Past Medical History  Diagnosis Date  . Barrett esophagus   . Heart murmur     at birth  . Hypertension   . Headache(784.0)     chronic  . Sleep apnea     uses cpap  . Pneumonia 08/2012  . GERD (gastroesophageal reflux disease)   . H/O hiatal hernia   . Migraine   . Sinusitis   . BPH (benign prostatic hyperplasia)   . Fatty liver     Past Surgical History  Procedure Laterality Date  . Appendectomy    . Upper gi endoscopy  2013  . Esophageal manometry N/A 11/12/2012    Procedure: ESOPHAGEAL MANOMETRY (EM);  Surgeon: Beryle Beams, MD;  Location: [facility];  Service: Endoscopy;  Laterality: N/A;  . Nasal septum surgery      Current Outpatient Prescriptions  Medication Sig Dispense Refill  . baclofen (LIORESAL) 10 MG tablet Take 10 mg by mouth 3 (three) times daily as needed (for muscle spasms).      . Diclofenac Potassium (CAMBIA PO) Take by mouth.      . esomeprazole (NEXIUM) 40 MG capsule Take 40 mg by mouth daily before breakfast.      . lamoTRIgine (LAMICTAL) 200 MG tablet Take 200 mg by mouth daily.      Marland Kitchen lisinopril (PRINIVIL,ZESTRIL) 10 MG tablet Take 10 mg by mouth daily.      .  Pseudoephedrine-Guaifenesin (MUCINEX D PO) Take by mouth.      . sucralfate (CARAFATE) 1 GM/10ML suspension Take 10 mLs (1 g total) by mouth 4 (four) times daily -  with meals and at bedtime.  420 mL  0   No current facility-administered medications for this visit.   Doxycycline and Nitroglycerin Family History  Problem Relation Age of Onset  . Cancer Father     skin  . Cancer Paternal Aunt     breast  . Cancer Mother     pancreatic   Social History:   reports that he has never smoked. He has never used smokeless tobacco. He reports that he does not drink alcohol or use illicit drugs.   REVIEW OF SYSTEMS - PERTINENT POSITIVES ONLY: Review of systems is negative for problems with bleeding or easy bruisability. His GI is positive for the dysphagia heartburn. Denies any bloody bowel movements. Cardiology note chest pain or shortness of breath. Most the skeletal no arthropathies.  Physical Exam:   Blood pressure 120/80, pulse 72, temperature 98.6 F (37 C), temperature source Temporal, resp. rate 14, height 5\' 8"  (1.727 m), weight 196 lb (88.905 kg). Body mass index is 29.81 kg/(m^2).  Gen:  WDWN white male NAD  Neurological: Alert and oriented to person, place, and time. Motor and sensory function is grossly intact  Head: Normocephalic and atraumatic.  Eyes: Conjunctivae are normal. Pupils are equal, round, and reactive to light. No scleral icterus.  Neck: Normal range of motion. Neck supple. No tracheal deviation or thyromegaly present.  Cardiovascular:  SR without murmurs or gallops.  No carotid bruits Respiratory: Effort normal.  No respiratory distress. No chest wall tenderness. Breath sounds normal.  No wheezes, rales or rhonchi.  Abdomen:  nontender to palpation. GU: Musculoskeletal: Normal range of motion. Extremities are nontender. No cyanosis, edema or clubbing noted Lymphadenopathy: No cervical, preauricular, postauricular or axillary adenopathy is present Skin: Skin is  warm and dry. No rash noted. No diaphoresis. No erythema. No pallor. Pscyh: Normal mood and affect. Behavior is normal. Judgment and thought content normal.   LABORATORY RESULTS: No results found for this or any previous visit (from the past 48 hour(s)).  RADIOLOGY RESULTS: No results found.  Problem List: Patient Active Problem List   Diagnosis Date Noted  . GERD (gastroesophageal reflux disease) 01/25/2013  . CAP (community acquired pneumonia) 08/30/2012  . AKI (acute kidney injury) 08/30/2012  . 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  . Hypoxia 08/30/2012  . Barrett esophagus 08/30/2012  . OSA (obstructive sleep apnea) 08/30/2012    Assessment & Plan: History and documentation for significant GERD over about a 10 year period. UGI shows hiatus hernia.  I discussed the procedure with him again and he was to proceed with laparoscopic Nissen fundoplication. We will go ahead and see about scheduling him for this at his convenience.    Matt B. Hassell Done, MD, [facility] [phone] beeper [phone]  02/22/2013 1:03 PM

## 2013-02-25 ENCOUNTER — Ambulatory Visit (INDEPENDENT_AMBULATORY_CARE_PROVIDER_SITE_OTHER): Payer: 59 | Admitting: Internal Medicine

## 2013-02-25 ENCOUNTER — Encounter: Payer: Self-pay | Admitting: Internal Medicine

## 2013-02-25 VITALS — BP 131/81 | HR 98 | Temp 98.1°F | Ht 68.5 in | Wt 199.3 lb

## 2013-02-25 DIAGNOSIS — R21 Rash and other nonspecific skin eruption: Secondary | ICD-10-CM | POA: Insufficient documentation

## 2013-02-25 DIAGNOSIS — L989 Disorder of the skin and subcutaneous tissue, unspecified: Secondary | ICD-10-CM

## 2013-02-25 MED ORDER — LISINOPRIL 10 MG PO TABS: 10.0000 mg | ORAL_TABLET | Freq: Every day | ORAL | Status: DC

## 2013-02-25 NOTE — Progress Notes (Signed)
   Subjective:    Patient ID: John [name], male    DOB: 06-12-1970, 43 y.o.   MRN: [id number]  HPI Comments: John [name] is a 43 year old male with a PMH of HTN, GERD and OSA.  He presents with 1 month complaint of left hip cyst.  In the past two weeks the lesion has gotten more red and irritated.  It is not pruritic.  It is painful when his clothing brushes up against it.  He has not noticed a mole there previously.  He has never noticed anything on his skin like this before.  He otherwise, feels well and denies fever, N/V, diarrhea and he has a good appetite.      Review of Systems  Constitutional: Negative for fever and appetite change.  Respiratory: Negative for shortness of breath.   Cardiovascular: Negative for chest pain.  Gastrointestinal: Negative for nausea and vomiting.  Skin: Positive for rash.  Neurological: Positive for headaches.       Chronic headaches.       Objective:   Physical Exam  Vitals reviewed. Constitutional: He is oriented to person, place, and time. No distress.  Neck: Neck supple.  Cardiovascular: Normal rate, regular rhythm and normal heart sounds.   Pulmonary/Chest: Effort normal. No respiratory distress. He has no wheezes. He has no rales.  Abdominal: Soft. Bowel sounds are normal. He exhibits no distension. There is no tenderness.  Neurological: He is alert and oriented to person, place, and time.  Skin: Skin is warm. He is not diaphoretic.  Small (eraser-sized) raised and inflamed lesion on the left hip.  Erythematous base.  Minimally TTP.  No bleeding or draining pus.  Psychiatric: He has a normal mood and affect. His behavior is normal.          Assessment & Plan:  Please see problem based assessment and plan.

## 2013-02-25 NOTE — Assessment & Plan Note (Signed)
Small area of raised inflamed skin.  Does not appear infected.  Irritated by clothing and minimally TTP.  Advised patient to keep it protected from clothing brushing up against it.  Will refer to Dermatology for evaluation and possible removal and biopsy.

## 2013-02-25 NOTE — Patient Instructions (Signed)
1. You will be contacted with the date and time of your dermatology appointment.  Please call the clinic if you have worsening of your symptoms.   2. Please take all medications as prescribed.    3. If you have worsening of your symptoms or new symptoms arise, please call the clinic ([phone]), or go to the ER immediately if symptoms are severe.

## 2013-02-26 NOTE — Progress Notes (Signed)
I saw and evaluated the patient.  I personally confirmed the key portions of the history and exam documented by Dr. Wilson and I reviewed pertinent patient test results.  The assessment, diagnosis, and plan were formulated together and I agree with the documentation in the resident's note. 

## 2013-03-12 NOTE — Addendum Note (Signed)
Addended by: Hulan Fray on: 03/12/2013 07:47 AM   Modules accepted: Orders

## 2013-03-28 ENCOUNTER — Encounter (HOSPITAL_COMMUNITY): Payer: Self-pay

## 2013-03-28 ENCOUNTER — Encounter (HOSPITAL_COMMUNITY)
Admission: RE | Admit: 2013-03-28 | Discharge: 2013-03-28 | Disposition: A | Discharge status: Home / Self Care | Payer: 59 | Source: Ambulatory Visit | Attending: Surgery | Admitting: Surgery

## 2013-03-28 ENCOUNTER — Encounter (HOSPITAL_COMMUNITY): Payer: Self-pay | Admitting: Pharmacy Technician

## 2013-03-28 ENCOUNTER — Other Ambulatory Visit: Payer: Self-pay

## 2013-03-28 DIAGNOSIS — Z01812 Encounter for preprocedural laboratory examination: Secondary | ICD-10-CM | POA: Insufficient documentation

## 2013-03-28 DIAGNOSIS — Z01810 Encounter for preprocedural cardiovascular examination: Secondary | ICD-10-CM | POA: Insufficient documentation

## 2013-03-28 LAB — BASIC METABOLIC PANEL
BUN: 15 mg/dL (ref 6–23)
CO2: 29 meq/L (ref 19–32)
Calcium: 9.4 mg/dL (ref 8.4–10.5)
Chloride: 98 mEq/L (ref 96–112)
Creatinine, Ser: 1.19 mg/dL (ref 0.50–1.35)
GFR calc Af Amer: 86 mL/min — ABNORMAL LOW (ref >90)
GFR calc non Af Amer: 74 mL/min — ABNORMAL LOW (ref >90)
GLUCOSE: 111 mg/dL — AB (ref 70–99)
POTASSIUM: 3.8 meq/L (ref 3.7–5.3)
SODIUM: 138 meq/L (ref 137–147)

## 2013-03-28 LAB — CBC
HEMATOCRIT: 44.1 % (ref 39.0–52.0)
HEMOGLOBIN: 15.4 g/dL (ref 13.0–17.0)
MCH: 30.3 pg (ref 26.0–34.0)
MCHC: 34.9 g/dL (ref 30.0–36.0)
MCV: 86.8 fL (ref 78.0–100.0)
Platelets: 268 10*3/uL (ref 150–400)
RBC: 5.08 MIL/uL (ref 4.22–5.81)
RDW: 12.3 % (ref 11.5–15.5)
WBC: 5.5 10*3/uL (ref 4.0–10.5)

## 2013-03-28 MED ORDER — CHLORHEXIDINE GLUCONATE 4 % EX LIQD
1.0000 "application " | Freq: Once | CUTANEOUS | Status: DC
  Filled 2013-03-28: qty 15

## 2013-03-28 NOTE — Pre-Procedure Instructions (Signed)
CXR REPORT IN EPIC FROM 10/16/12. EKG WAS DONE TODAY PREOP - AT  [facility].

## 2013-03-28 NOTE — Patient Instructions (Addendum)
           FLEET'S ENEMA THE NIGHT BEFORE YOUR SURGERY.   YOUR SURGERY IS SCHEDULED AT [facility]  ON:  Friday  2/20   REPORT TO  SHORT STAY CENTER AT:  5:30 AM      PHONE # FOR SHORT STAY IS [phone]  DO NOT EAT OR DRINK ANYTHING AFTER MIDNIGHT THE NIGHT BEFORE YOUR SURGERY.  YOU MAY BRUSH YOUR TEETH, RINSE OUT YOUR MOUTH--BUT NO WATER, NO FOOD, NO CHEWING GUM, NO MINTS, NO CANDIES, NO CHEWING TOBACCO.  PLEASE TAKE THE FOLLOWING MEDICATIONS THE AM OF YOUR SURGERY WITH A FEW SIPS OF WATER:  [place].   IF MIGRAINE - TAKE CAMBIA.   IF YOU HAVE SLEEP APNEA AND USE CPAP OR BIPAP--PLEASE BRING THE MASK AND THE TUBING.  DO NOT BRING YOUR MACHINE.  DO NOT BRING VALUABLES, MONEY, CREDIT CARDS.  DO NOT WEAR JEWELRY, MAKE-UP, NAIL POLISH AND NO METAL PINS OR CLIPS IN YOUR HAIR. CONTACT LENS, DENTURES / PARTIALS, GLASSES SHOULD NOT BE WORN TO SURGERY AND IN MOST CASES-HEARING AIDS WILL NEED TO BE REMOVED.  BRING YOUR GLASSES CASE, ANY EQUIPMENT NEEDED FOR YOUR CONTACT LENS. FOR PATIENTS ADMITTED TO THE HOSPITAL--CHECK OUT TIME THE DAY OF DISCHARGE IS 11:00 AM.  ALL INPATIENT ROOMS ARE PRIVATE - WITH BATHROOM, TELEPHONE, TELEVISION AND WIFI INTERNET.  IF YOU ARE BEING DISCHARGED THE SAME DAY OF YOUR SURGERY--YOU CAN NOT DRIVE YOURSELF HOME--AND SHOULD NOT GO HOME ALONE BY TAXI OR BUS.  NO DRIVING OR OPERATING MACHINERY, DO NOT MAKE LEGAL DECISIONS FOR 24 HOURS FOLLOWING ANESTHESIA / PAIN MEDICATIONS.  PLEASE MAKE ARRANGEMENTS FOR SOMEONE TO BE WITH YOU AT HOME THE FIRST 24 HOURS AFTER SURGERY. RESPONSIBLE DRIVER'S NAME / PHONE                                                    FAILURE TO FOLLOW THESE INSTRUCTIONS MAY RESULT IN THE CANCELLATION OF YOUR SURGERY. PLEASE BE AWARE THAT YOU MAY NEED ADDITIONAL BLOOD DRAWN DAY OF YOUR SURGERY  PATIENT SIGNATURE_________________________________

## 2013-04-04 NOTE — H&P (Signed)
Chief Complaint: Gastroesophageal reflux  History of Present Illness: John [name] is an 43 y.o. male referred by Dr. Benson Norway because of a several year history of acid reflux and heartburn. The patient has had a manometry which showed good motility continues to have problems with irritation and acid and at times dysphagia. He has a history of a hiatal hernia but has not had a recent upper GI series. He had pneumonia back in July it is unclear whether this is related to aspiration.  I described a Nissen fundoplication to him and his wife in some detail. I gave him a booklet on this to consider surgery. He has good questions regarding potential complications and failure and these questions were answered. UGI reviewed and this showed a hiatus hernia but not reflux demonstrated.  Past Medical History   Diagnosis  Date   .  Barrett esophagus    .  Heart murmur      at birth   .  Hypertension    .  Headache(784.0)      chronic   .  Sleep apnea      uses cpap   .  Pneumonia  08/2012   .  GERD (gastroesophageal reflux disease)    .  H/O hiatal hernia    .  Migraine    .  Sinusitis    .  BPH (benign prostatic hyperplasia)    .  Fatty liver     Past Surgical History   Procedure  Laterality  Date   .  Appendectomy     .  Upper gi endoscopy   2013   .  Esophageal manometry  N/A  11/12/2012     Procedure: ESOPHAGEAL MANOMETRY (EM); Surgeon: Beryle Beams, MD; Location: [facility]; Service: Endoscopy; Laterality: N/A;   .  Nasal septum surgery      Current Outpatient Prescriptions   Medication  Sig  Dispense  Refill   .  baclofen (LIORESAL) 10 MG tablet  Take 10 mg by mouth 3 (three) times daily as needed (for muscle spasms).     .  Diclofenac Potassium (CAMBIA PO)  Take by mouth.     .  esomeprazole (NEXIUM) 40 MG capsule  Take 40 mg by mouth daily before breakfast.     .  lamoTRIgine (LAMICTAL) 200 MG tablet  Take 200 mg by mouth daily.     Marland Kitchen  lisinopril (PRINIVIL,ZESTRIL) 10 MG tablet   Take 10 mg by mouth daily.     .  Pseudoephedrine-Guaifenesin (MUCINEX D PO)  Take by mouth.     .  sucralfate (CARAFATE) 1 GM/10ML suspension  Take 10 mLs (1 g total) by mouth 4 (four) times daily - with meals and at bedtime.  420 mL  0    No current facility-administered medications for this visit.   Doxycycline and Nitroglycerin  Family History   Problem  Relation  Age of Onset   .  Cancer  Father      skin   .  Cancer  Paternal Aunt      breast   .  Cancer  Mother      pancreatic   Social History: reports that he has never smoked. He has never used smokeless tobacco. He reports that he does not drink alcohol or use illicit drugs.  REVIEW OF SYSTEMS - PERTINENT POSITIVES ONLY:  Review of systems is negative for problems with bleeding or easy bruisability. His GI is positive for the dysphagia heartburn.  Denies any bloody bowel movements. Cardiology note chest pain or shortness of breath. Most the skeletal no arthropathies.  Physical Exam:  Blood pressure 120/80, pulse 72, temperature 98.6 F (37 C), temperature source Temporal, resp. rate 14, height 5\' 8"  (1.727 m), weight 196 lb (88.905 kg).  Body mass index is 29.81 kg/(m^2).  Gen: WDWN white male NAD  Neurological: Alert and oriented to person, place, and time. Motor and sensory function is grossly intact  Head: Normocephalic and atraumatic.  Eyes: Conjunctivae are normal. Pupils are equal, round, and reactive to light. No scleral icterus.  Neck: Normal range of motion. Neck supple. No tracheal deviation or thyromegaly present.  Cardiovascular: SR without murmurs or gallops. No carotid bruits  Respiratory: Effort normal. No respiratory distress. No chest wall tenderness. Breath sounds normal. No wheezes, rales or rhonchi.  Abdomen: nontender to palpation.  GU:  Musculoskeletal: Normal range of motion. Extremities are nontender. No cyanosis, edema or clubbing noted Lymphadenopathy: No cervical, preauricular, postauricular or  axillary adenopathy is present Skin: Skin is warm and dry. No rash noted. No diaphoresis. No erythema. No pallor. Pscyh: Normal mood and affect. Behavior is normal. Judgment and thought content normal.  LABORATORY RESULTS:  No results found for this or any previous visit (from the past 48 hour(s)).  RADIOLOGY RESULTS:  No results found.  Problem List:  Patient Active Problem List    Diagnosis  Date Noted   .  GERD (gastroesophageal reflux disease)  01/25/2013   .  CAP (community acquired pneumonia)  08/30/2012   .  AKI (acute kidney injury)  08/30/2012   .  Pill esophagitis  08/30/2012   .  Hypoxia  08/30/2012   .  Barrett esophagus  08/30/2012   .  OSA (obstructive sleep apnea)  08/30/2012   Assessment & Plan:  History and documentation for significant GERD over about a 10 year period. UGI shows hiatus hernia. I discussed the procedure with him again and he was to proceed with laparoscopic Nissen fundoplication. We will go ahead and see about scheduling him for this at his convenience.  Matt B. Hassell Done, MD, [facility]  [phone] beeper  [phone]

## 2013-04-05 ENCOUNTER — Ambulatory Visit (HOSPITAL_COMMUNITY): Payer: 59 | Admitting: Registered Nurse

## 2013-04-05 ENCOUNTER — Encounter (HOSPITAL_COMMUNITY): Payer: 59 | Admitting: Registered Nurse

## 2013-04-05 ENCOUNTER — Inpatient Hospital Stay (HOSPITAL_COMMUNITY)
Admission: RE | Admit: 2013-04-05 | Discharge: 2013-04-07 | DRG: 328 | Disposition: A | Discharge status: Home / Self Care | Payer: 59 | Source: Ambulatory Visit | Attending: Surgery | Admitting: Surgery

## 2013-04-05 ENCOUNTER — Encounter (HOSPITAL_COMMUNITY)
Admission: RE | Disposition: A | Discharge status: Home / Self Care | Payer: Self-pay | Source: Ambulatory Visit | Attending: Surgery

## 2013-04-05 ENCOUNTER — Encounter (HOSPITAL_COMMUNITY): Payer: Self-pay

## 2013-04-05 DIAGNOSIS — K449 Diaphragmatic hernia without obstruction or gangrene: Secondary | ICD-10-CM | POA: Diagnosis present

## 2013-04-05 DIAGNOSIS — K4090 Unilateral inguinal hernia, without obstruction or gangrene, not specified as recurrent: Secondary | ICD-10-CM | POA: Diagnosis present

## 2013-04-05 DIAGNOSIS — Z01812 Encounter for preprocedural laboratory examination: Secondary | ICD-10-CM

## 2013-04-05 DIAGNOSIS — Z9889 Other specified postprocedural states: Secondary | ICD-10-CM | POA: Diagnosis not present

## 2013-04-05 DIAGNOSIS — K219 Gastro-esophageal reflux disease without esophagitis: Principal | ICD-10-CM | POA: Diagnosis present

## 2013-04-05 DIAGNOSIS — K8020 Calculus of gallbladder without cholecystitis without obstruction: Secondary | ICD-10-CM | POA: Diagnosis present

## 2013-04-05 HISTORY — PX: LAPAROSCOPIC NISSEN FUNDOPLICATION: SHX1932

## 2013-04-05 LAB — CBC
HCT: 40.3 % (ref 39.0–52.0)
Hemoglobin: 14.1 g/dL (ref 13.0–17.0)
MCH: 30.6 pg (ref 26.0–34.0)
MCHC: 35 g/dL (ref 30.0–36.0)
MCV: 87.4 fL (ref 78.0–100.0)
Platelets: 240 10*3/uL (ref 150–400)
RBC: 4.61 MIL/uL (ref 4.22–5.81)
RDW: 12.2 % (ref 11.5–15.5)
WBC: 11.8 10*3/uL — ABNORMAL HIGH (ref 4.0–10.5)

## 2013-04-05 LAB — CREATININE, SERUM
CREATININE: 1.24 mg/dL (ref 0.50–1.35)
GFR calc Af Amer: 82 mL/min — ABNORMAL LOW (ref >90)
GFR calc non Af Amer: 71 mL/min — ABNORMAL LOW (ref >90)

## 2013-04-05 SURGERY — FUNDOPLICATION, NISSEN, LAPAROSCOPIC
Anesthesia: General

## 2013-04-05 MED ORDER — 0.9 % SODIUM CHLORIDE (POUR BTL) OPTIME
TOPICAL | Status: DC | PRN
  Administered 2013-04-05: 1000 mL

## 2013-04-05 MED ORDER — ONDANSETRON HCL 4 MG PO TABS: 4.0000 mg | ORAL_TABLET | Freq: Four times a day (QID) | ORAL | Status: DC | PRN

## 2013-04-05 MED ORDER — HEPARIN SODIUM (PORCINE) 5000 UNIT/ML IJ SOLN
5000.0000 [IU] | Freq: Once | INTRAMUSCULAR | Status: AC
  Administered 2013-04-05: 5000 [IU] via SUBCUTANEOUS
  Filled 2013-04-05: qty 1

## 2013-04-05 MED ORDER — FENTANYL CITRATE 0.05 MG/ML IJ SOLN
INTRAMUSCULAR | Status: DC | PRN
  Administered 2013-04-05 (×5): 50 ug via INTRAVENOUS

## 2013-04-05 MED ORDER — LIDOCAINE HCL (CARDIAC) 20 MG/ML IV SOLN
INTRAVENOUS | Status: AC
  Filled 2013-04-05: qty 5

## 2013-04-05 MED ORDER — LACTATED RINGERS IV SOLN
INTRAVENOUS | Status: DC | PRN
  Administered 2013-04-05 (×2): via INTRAVENOUS

## 2013-04-05 MED ORDER — ROCURONIUM BROMIDE 100 MG/10ML IV SOLN
INTRAVENOUS | Status: DC | PRN
  Administered 2013-04-05: 10 mg via INTRAVENOUS
  Administered 2013-04-05: 5 mg via INTRAVENOUS
  Administered 2013-04-05: 30 mg via INTRAVENOUS
  Administered 2013-04-05: 20 mg via INTRAVENOUS

## 2013-04-05 MED ORDER — EPHEDRINE SULFATE 50 MG/ML IJ SOLN
INTRAMUSCULAR | Status: AC
  Filled 2013-04-05: qty 1

## 2013-04-05 MED ORDER — PHENYLEPHRINE HCL 10 MG/ML IJ SOLN
INTRAMUSCULAR | Status: DC | PRN
  Administered 2013-04-05 (×5): 80 ug via INTRAVENOUS

## 2013-04-05 MED ORDER — FENTANYL CITRATE 0.05 MG/ML IJ SOLN
INTRAMUSCULAR | Status: AC
  Filled 2013-04-05: qty 5

## 2013-04-05 MED ORDER — ROCURONIUM BROMIDE 100 MG/10ML IV SOLN
INTRAVENOUS | Status: AC
  Filled 2013-04-05: qty 1

## 2013-04-05 MED ORDER — OXYCODONE HCL 5 MG/5ML PO SOLN
5.0000 mg | Freq: Once | ORAL | Status: DC | PRN
  Filled 2013-04-05: qty 5

## 2013-04-05 MED ORDER — PHENYLEPHRINE 40 MCG/ML (10ML) SYRINGE FOR IV PUSH (FOR BLOOD PRESSURE SUPPORT)
PREFILLED_SYRINGE | INTRAVENOUS | Status: AC
  Filled 2013-04-05: qty 10

## 2013-04-05 MED ORDER — SUCCINYLCHOLINE CHLORIDE 20 MG/ML IJ SOLN
INTRAMUSCULAR | Status: DC | PRN
  Administered 2013-04-05: 100 mg via INTRAVENOUS

## 2013-04-05 MED ORDER — MIDAZOLAM HCL 5 MG/5ML IJ SOLN
INTRAMUSCULAR | Status: DC | PRN
  Administered 2013-04-05: 2 mg via INTRAVENOUS

## 2013-04-05 MED ORDER — LACTATED RINGERS IR SOLN
Status: DC | PRN
  Administered 2013-04-05: 1000 mL

## 2013-04-05 MED ORDER — PROPOFOL 10 MG/ML IV BOLUS
INTRAVENOUS | Status: DC | PRN
  Administered 2013-04-05: 200 mg via INTRAVENOUS

## 2013-04-05 MED ORDER — SODIUM CHLORIDE 0.9 % IJ SOLN
INTRAMUSCULAR | Status: AC
  Filled 2013-04-05: qty 10

## 2013-04-05 MED ORDER — KCL IN DEXTROSE-NACL 20-5-0.45 MEQ/L-%-% IV SOLN
INTRAVENOUS | Status: DC
  Administered 2013-04-05 – 2013-04-07 (×4): via INTRAVENOUS
  Filled 2013-04-05 (×5): qty 1000

## 2013-04-05 MED ORDER — MIDAZOLAM HCL 2 MG/2ML IJ SOLN
INTRAMUSCULAR | Status: AC
  Filled 2013-04-05: qty 2

## 2013-04-05 MED ORDER — EPHEDRINE SULFATE 50 MG/ML IJ SOLN
INTRAMUSCULAR | Status: DC | PRN
Start: 2013-04-05 — End: 2013-04-05
  Administered 2013-04-05: 10 mg via INTRAVENOUS

## 2013-04-05 MED ORDER — HYDROMORPHONE HCL PF 1 MG/ML IJ SOLN
1.0000 mg | INTRAMUSCULAR | Status: DC | PRN
  Administered 2013-04-05 – 2013-04-07 (×6): 1 mg via INTRAVENOUS
  Filled 2013-04-05 (×6): qty 1

## 2013-04-05 MED ORDER — PROPOFOL 10 MG/ML IV BOLUS
INTRAVENOUS | Status: AC
  Filled 2013-04-05: qty 20

## 2013-04-05 MED ORDER — PROMETHAZINE HCL 25 MG/ML IJ SOLN: 6.2500 mg | INTRAMUSCULAR | Status: DC | PRN

## 2013-04-05 MED ORDER — NEOSTIGMINE METHYLSULFATE 1 MG/ML IJ SOLN
INTRAMUSCULAR | Status: DC | PRN
  Administered 2013-04-05: 5 mg via INTRAVENOUS

## 2013-04-05 MED ORDER — DEXAMETHASONE SODIUM PHOSPHATE 10 MG/ML IJ SOLN
INTRAMUSCULAR | Status: DC | PRN
  Administered 2013-04-05: 4 mg via INTRAVENOUS
  Administered 2013-04-05: 10 mg via INTRAVENOUS

## 2013-04-05 MED ORDER — ATROPINE SULFATE 0.4 MG/ML IJ SOLN
INTRAMUSCULAR | Status: AC
  Filled 2013-04-05: qty 2

## 2013-04-05 MED ORDER — HYDROMORPHONE HCL PF 1 MG/ML IJ SOLN
0.2500 mg | INTRAMUSCULAR | Status: DC | PRN
  Administered 2013-04-05 (×2): 0.5 mg via INTRAVENOUS

## 2013-04-05 MED ORDER — ONDANSETRON HCL 4 MG/2ML IJ SOLN
INTRAMUSCULAR | Status: AC
  Filled 2013-04-05: qty 2

## 2013-04-05 MED ORDER — LIDOCAINE HCL (CARDIAC) 20 MG/ML IV SOLN
INTRAVENOUS | Status: DC | PRN
  Administered 2013-04-05: 80 mg via INTRAVENOUS

## 2013-04-05 MED ORDER — DEXAMETHASONE SODIUM PHOSPHATE 10 MG/ML IJ SOLN
INTRAMUSCULAR | Status: AC
  Filled 2013-04-05: qty 1

## 2013-04-05 MED ORDER — OXYCODONE HCL 5 MG PO TABS: 5.0000 mg | ORAL_TABLET | Freq: Once | ORAL | Status: DC | PRN

## 2013-04-05 MED ORDER — HEPARIN SODIUM (PORCINE) 5000 UNIT/ML IJ SOLN
5000.0000 [IU] | Freq: Three times a day (TID) | INTRAMUSCULAR | Status: DC
  Administered 2013-04-05 – 2013-04-07 (×6): 5000 [IU] via SUBCUTANEOUS
  Filled 2013-04-05 (×8): qty 1

## 2013-04-05 MED ORDER — ONDANSETRON HCL 4 MG/2ML IJ SOLN: 4.0000 mg | Freq: Four times a day (QID) | INTRAMUSCULAR | Status: DC | PRN

## 2013-04-05 MED ORDER — DEXTROSE 5 % IV SOLN
INTRAVENOUS | Status: AC
  Filled 2013-04-05 (×2): qty 1

## 2013-04-05 MED ORDER — DEXTROSE 5 % IV SOLN
2.0000 g | INTRAVENOUS | Status: AC
  Administered 2013-04-05: 2 g via INTRAVENOUS
  Filled 2013-04-05: qty 2

## 2013-04-05 MED ORDER — MEPERIDINE HCL 50 MG/ML IJ SOLN
6.2500 mg | INTRAMUSCULAR | Status: DC | PRN
Start: 2013-04-05 — End: 2013-04-05

## 2013-04-05 MED ORDER — HYDROMORPHONE HCL PF 1 MG/ML IJ SOLN
INTRAMUSCULAR | Status: AC
  Filled 2013-04-05: qty 1

## 2013-04-05 MED ORDER — MORPHINE SULFATE 2 MG/ML IJ SOLN
1.0000 mg | INTRAMUSCULAR | Status: DC | PRN
  Administered 2013-04-05 (×4): 1 mg via INTRAVENOUS
  Filled 2013-04-05 (×4): qty 1

## 2013-04-05 MED ORDER — LAMOTRIGINE 100 MG PO TABS
200.0000 mg | ORAL_TABLET | Freq: Every evening | ORAL | Status: DC
  Administered 2013-04-05 – 2013-04-06 (×2): 200 mg via ORAL
  Filled 2013-04-05 (×2): qty 2
  Filled 2013-04-05: qty 1

## 2013-04-05 MED ORDER — BUPIVACAINE LIPOSOME 1.3 % IJ SUSP
20.0000 mL | Freq: Once | INTRAMUSCULAR | Status: AC
  Administered 2013-04-05: 20 mL
  Filled 2013-04-05: qty 20

## 2013-04-05 MED ORDER — GLYCOPYRROLATE 0.2 MG/ML IJ SOLN
INTRAMUSCULAR | Status: DC | PRN
  Administered 2013-04-05: 0.6 mg via INTRAVENOUS

## 2013-04-05 SURGICAL SUPPLY — 57 items
APPLIER CLIP ROT 10 11.4 M/L (STAPLE)
BENZOIN TINCTURE PRP APPL 2/3 (GAUZE/BANDAGES/DRESSINGS) IMPLANT
CABLE HIGH FREQUENCY MONO STRZ (ELECTRODE) IMPLANT
CANISTER SUCTION 2500CC (MISCELLANEOUS) IMPLANT
CLAMP ENDO BABCK 10MM (STAPLE) IMPLANT
CLIP APPLIE ROT 10 11.4 M/L (STAPLE) IMPLANT
COVER SURGICAL LIGHT HANDLE (MISCELLANEOUS) ×2 IMPLANT
DECANTER SPIKE VIAL GLASS SM (MISCELLANEOUS) IMPLANT
DERMABOND ADVANCED (GAUZE/BANDAGES/DRESSINGS) ×1
DERMABOND ADVANCED .7 DNX12 (GAUZE/BANDAGES/DRESSINGS) ×1 IMPLANT
DEVICE SUT QUICK LOAD TK 5 (STAPLE) ×8 IMPLANT
DEVICE SUT TI-KNOT TK 5X26 (MISCELLANEOUS) ×2 IMPLANT
DEVICE SUTURE ENDOST 10MM (ENDOMECHANICALS) IMPLANT
DISSECTOR BLUNT TIP ENDO 5MM (MISCELLANEOUS) ×2 IMPLANT
DRAIN PENROSE 18X1/2 LTX STRL (DRAIN) ×2 IMPLANT
DRAPE LAPAROSCOPIC ABDOMINAL (DRAPES) ×2 IMPLANT
ELECT REM PT RETURN 9FT ADLT (ELECTROSURGICAL) ×2
ELECTRODE REM PT RTRN 9FT ADLT (ELECTROSURGICAL) ×1 IMPLANT
FELT TEFLON 4 X1 (Mesh General) ×2 IMPLANT
FILTER SMOKE EVAC LAPAROSHD (FILTER) IMPLANT
GLOVE BIOGEL M 8.0 STRL (GLOVE) ×2 IMPLANT
GLOVE BIOGEL PI IND STRL 7.0 (GLOVE) IMPLANT
GLOVE BIOGEL PI INDICATOR 7.0 (GLOVE)
GOWN STRL REUS W/TWL LRG LVL3 (GOWN DISPOSABLE) ×2 IMPLANT
GOWN STRL REUS W/TWL XL LVL3 (GOWN DISPOSABLE) ×4 IMPLANT
GRASPER ENDO BABCOCK 10 (MISCELLANEOUS) IMPLANT
GRASPER ENDO BABCOCK 10MM (MISCELLANEOUS)
KIT BASIN OR (CUSTOM PROCEDURE TRAY) ×2 IMPLANT
NS IRRIG 1000ML POUR BTL (IV SOLUTION) ×2 IMPLANT
PENCIL BUTTON HOLSTER BLD 10FT (ELECTRODE) IMPLANT
SCALPEL HARMONIC ACE (MISCELLANEOUS) ×2 IMPLANT
SCISSORS LAP 5X35 DISP (ENDOMECHANICALS) ×2 IMPLANT
SET IRRIG TUBING LAPAROSCOPIC (IRRIGATION / IRRIGATOR) ×2 IMPLANT
SLEEVE ADV FIXATION 5X100MM (TROCAR) IMPLANT
SLEEVE XCEL OPT CAN 5 100 (ENDOMECHANICALS) ×6 IMPLANT
SLEEVE Z-THREAD 5X100MM (TROCAR) IMPLANT
SOLUTION ANTI FOG 6CC (MISCELLANEOUS) ×2 IMPLANT
STAPLER VISISTAT 35W (STAPLE) ×2 IMPLANT
STRIP CLOSURE SKIN 1/2X4 (GAUZE/BANDAGES/DRESSINGS) IMPLANT
SUT DEVICE BRAIDED 0X39 (SUTURE) ×8 IMPLANT
SUT SURGIDAC NAB ES-9 0 48 120 (SUTURE) IMPLANT
SUT VIC AB 4-0 SH 18 (SUTURE) ×2 IMPLANT
SYR 30ML LL (SYRINGE) ×2 IMPLANT
TIP INNERVISION DETACH 40FR (MISCELLANEOUS) IMPLANT
TIP INNERVISION DETACH 50FR (MISCELLANEOUS) IMPLANT
TIP INNERVISION DETACH 56FR (MISCELLANEOUS) IMPLANT
TIPS INNERVISION DETACH 40FR (MISCELLANEOUS)
TRAY FOLEY CATH 14FRSI W/METER (CATHETERS) ×2 IMPLANT
TRAY LAP CHOLE (CUSTOM PROCEDURE TRAY) ×2 IMPLANT
TROCAR ADV FIXATION 11X100MM (TROCAR) IMPLANT
TROCAR ADV FIXATION 5X100MM (TROCAR) IMPLANT
TROCAR BLADELESS OPT 5 100 (ENDOMECHANICALS) ×2 IMPLANT
TROCAR XCEL 12X100 BLDLESS (ENDOMECHANICALS) ×2 IMPLANT
TROCAR XCEL BLUNT TIP 100MML (ENDOMECHANICALS) IMPLANT
TROCAR XCEL NON-BLD 11X100MML (ENDOMECHANICALS) IMPLANT
TROCAR XCEL UNIV SLVE 11M 100M (ENDOMECHANICALS) IMPLANT
TUBING FILTER THERMOFLATOR (ELECTROSURGICAL) ×2 IMPLANT

## 2013-04-05 NOTE — Preoperative (Signed)
Beta Blockers   Reason not to administer Beta Blockers:Not Applicable 

## 2013-04-05 NOTE — Interval H&P Note (Signed)
History and Physical Interval Note:  04/05/2013 7:25 AM  John [name]  has presented today for surgery, with the diagnosis of Jerrye Bushy with hiatal hernia  The various methods of treatment have been discussed with the patient and family. After consideration of risks, benefits and other options for treatment, the patient has consented to  Procedure(s): LAPAROSCOPIC NISSEN FUNDOPLICATION (N/A) as a surgical intervention .  The patient's history has been reviewed, patient examined, no change in status, stable for surgery.  I have reviewed the patient's chart and labs.  Questions were answered to the patient's satisfaction.     Yesenia Fontenette B

## 2013-04-05 NOTE — Progress Notes (Signed)
Wife has CPAP and mask in waiting room

## 2013-04-05 NOTE — Anesthesia Preprocedure Evaluation (Addendum)
Anesthesia Evaluation  Patient identified by MRN, date of birth, ID band Patient awake    Reviewed: Allergy & Precautions, H&P , NPO status , Patient's Chart, lab work & pertinent test results  History of Anesthesia Complications (+) AWARENESS UNDER ANESTHESIA  Airway Mallampati: III TM Distance: >3 FB Neck ROM: Full    Dental  (+) Dental Advisory Given   Pulmonary sleep apnea , pneumonia -, resolved,  breath sounds clear to auscultation        Cardiovascular hypertension, Pt. on medications negative cardio ROS  + Valvular Problems/Murmurs Rhythm:Regular Rate:Normal     Neuro/Psych  Headaches, negative psych ROS   GI/Hepatic Neg liver ROS, hiatal hernia, GERD-  Medicated,  Endo/Other  negative endocrine ROS  Renal/GU Renal disease     Musculoskeletal negative musculoskeletal ROS (+)   Abdominal   Peds  Hematology negative hematology ROS (+)   Anesthesia Other Findings   Reproductive/Obstetrics                        Anesthesia Physical Anesthesia Plan  ASA: II  Anesthesia Plan: General   Post-op Pain Management:    Induction: Intravenous, Rapid sequence and Cricoid pressure planned  Airway Management Planned: Oral ETT and Video Laryngoscope Planned  Additional Equipment:   Intra-op Plan:   Post-operative Plan: Extubation in OR  Informed Consent: I have reviewed the patients History and Physical, chart, labs and discussed the procedure including the risks, benefits and alternatives for the proposed anesthesia with the patient or authorized representative who has indicated his/her understanding and acceptance.   Dental advisory given  Plan Discussed with: CRNA  Anesthesia Plan Comments:       Anesthesia Quick Evaluation

## 2013-04-05 NOTE — Anesthesia Postprocedure Evaluation (Signed)
Anesthesia Post Note  Patient: John [name]  Procedure(s) Performed: Procedure(s) (LRB): LAPAROSCOPIC NISSEN FUNDOPLICATION (N/A)  Anesthesia type: General  Patient location: PACU  Post pain: Pain level controlled  Post assessment: Post-op Vital signs reviewed  Last Vitals: BP 137/80  Pulse 87  Temp(Src) 36.7 C (Oral)  Resp 18  Ht 5\' 8"  (1.727 m)  Wt 196 lb (88.905 kg)  BMI 29.81 kg/m2  SpO2 99%  Post vital signs: Reviewed  Level of consciousness: sedated  Complications: No apparent anesthesia complications

## 2013-04-05 NOTE — Progress Notes (Signed)
Spoke with pt regarding cpap.  Pt refuses to wear it tonight, stating that his mouth and throat are dry/hurting and that cpap would just make it worse.  Pt was advised that RT is available all night should he change his mind and to let his nurse know.

## 2013-04-05 NOTE — Op Note (Signed)
Surgeon: Kaylyn Lim, MD, FACS  Asst:  Armandina Gemma,  M.D. FACS  Anes:  general  Procedure: Laparoscopic Nissen fundoplication over 56 Fr dilator  Diagnosis: Symptomatic GERD  Complications: none  EBL:   minimal cc  Description of Procedure:  The patient was taken to oh or 1 and given general anesthesia. The abdomen was clipped, a Foley catheter inserted, prepped with PCMX and draped sterilely. A timeout was performed. Access to the abdomen was achieved to the left upper quadrant with a 5 mm 0 Optiview without difficulty. A second 5 mm was placed laterally on the left side and another more medially for the camera. A 12 another 5 or placed on the right side and the 5 mm the upper midline provide access for the [facility] retractor. Upon retracting and examining the foregut a dimple could be seen where the hiatal hernia was. This was a relatively small hiatal hernia.  I began by mobilizing foregut taking down the gastrohepatic window and in mobilizing the right crus and carrying this anteriorly. I then went over on the left side down short gastrics and freed up the top of the stomach anteriorly as well as posteriorly. Identify the right and left crus from both right and left side and placed a Penrose drain at that to facilitate retraction. I then sutured the right and left crus together with the Endo Close 360 and held the suture before securing it with a timeout in to we had successfully passed a 56 lighted bougie into the stomach. This was then secured with that in place. The stomach was then brought around and had retro-esophageal space and with the 56 Dilator in Pl. A Nissen wrap was completed using 3 sutures of 0 Surgidek and the Endo Close 360 securing each with Ty knots. On all 3 purchases I got a bite of esophagogastric junction.. A good wrap was present. The dilator was removed.  On entering I did notice that he had a right angle hernia that was easily viewed laparoscopically. There is also  some evidence of some adhesions to the fundus was gallbladder but no active cholecystitis was present. Patient had the wounds injected with Exparel and after decompression the skin was closed 4-0 Vicryl and Dermabond. Patient seemed to tolerate the procedure well taken recovery in satisfactory condition.  Matt B. Hassell Done, Center, [facility], [place]

## 2013-04-05 NOTE — Transfer of Care (Signed)
Immediate Anesthesia Transfer of Care Note  Patient: John [name]  Procedure(s) Performed: Procedure(s): LAPAROSCOPIC NISSEN FUNDOPLICATION (N/A)  Patient Location: PACU  Anesthesia Type:General  Level of Consciousness: awake, alert , oriented and patient cooperative  Airway & Oxygen Therapy: Patient Spontanous Breathing and Patient connected to face mask oxygen  Post-op Assessment: Report given to PACU RN, Post -op Vital signs reviewed and stable and Patient moving all extremities  Post vital signs: Reviewed and stable  Complications: No apparent anesthesia complications

## 2013-04-06 ENCOUNTER — Inpatient Hospital Stay (HOSPITAL_COMMUNITY): Payer: 59

## 2013-04-06 LAB — BASIC METABOLIC PANEL
BUN: 10 mg/dL (ref 6–23)
CHLORIDE: 101 meq/L (ref 96–112)
CO2: 29 mEq/L (ref 19–32)
CREATININE: 1.12 mg/dL (ref 0.50–1.35)
Calcium: 8.8 mg/dL (ref 8.4–10.5)
GFR calc non Af Amer: 80 mL/min — ABNORMAL LOW (ref >90)
Glucose, Bld: 126 mg/dL — ABNORMAL HIGH (ref 70–99)
POTASSIUM: 4.2 meq/L (ref 3.7–5.3)
SODIUM: 140 meq/L (ref 137–147)

## 2013-04-06 LAB — CBC
HCT: 38.3 % — ABNORMAL LOW (ref 39.0–52.0)
Hemoglobin: 12.9 g/dL — ABNORMAL LOW (ref 13.0–17.0)
MCH: 29.6 pg (ref 26.0–34.0)
MCHC: 33.7 g/dL (ref 30.0–36.0)
MCV: 87.8 fL (ref 78.0–100.0)
Platelets: 253 10*3/uL (ref 150–400)
RBC: 4.36 MIL/uL (ref 4.22–5.81)
RDW: 12.3 % (ref 11.5–15.5)
WBC: 9.4 10*3/uL (ref 4.0–10.5)

## 2013-04-06 MED ORDER — IOHEXOL 300 MG/ML  SOLN
50.0000 mL | Freq: Once | INTRAMUSCULAR | Status: AC | PRN
  Administered 2013-04-06: 50 mL via ORAL

## 2013-04-06 MED ORDER — DICLOFENAC SODIUM 50 MG PO TBEC
50.0000 mg | DELAYED_RELEASE_TABLET | Freq: Two times a day (BID) | ORAL | Status: DC | PRN
  Administered 2013-04-06 – 2013-04-07 (×3): 50 mg via ORAL
  Filled 2013-04-06 (×3): qty 1

## 2013-04-06 NOTE — Progress Notes (Signed)
Patient ID: John [name], male   DOB: Oct 02, 1970, 43 y.o.   MRN: [id number] [facility] Surgery Progress Note:   1 Day Post-Op  Subjective: Mental status is clear.  Back from UGI Objective: Vital signs in last 24 hours: Temp:  [97.7 F (36.5 C)-98.1 F (36.7 C)] 97.9 F (36.6 C) (02/21 0535) Pulse Rate:  [74-101] 74 (02/21 0535) Resp:  [13-18] 18 (02/21 0535) BP: (108-141)/(70-82) 111/71 mmHg (02/21 0535) SpO2:  [90 %-100 %] 98 % (02/21 0535) Weight:  [196 lb (88.905 kg)] 196 lb (88.905 kg) (02/20 1115)  Intake/Output from previous day: 02/20 0701 - 02/21 0700 In: 3313.3 [I.V.:3313.3] Out: 1875 [Urine:1850; Blood:25] Intake/Output this shift:    Physical Exam: Work of breathing is normal.  Pain better controlled.  Incisions OK  Lab Results:  Results for orders placed during the hospital encounter of 04/05/13 (from the past 48 hour(s))  CBC     Status: Abnormal   Collection Time    04/05/13 11:36 AM      Result Value Ref Range   WBC 11.8 (*) 4.0 - 10.5 K/uL   RBC 4.61  4.22 - 5.81 MIL/uL   Hemoglobin 14.1  13.0 - 17.0 g/dL   HCT 40.3  39.0 - 52.0 %   MCV 87.4  78.0 - 100.0 fL   MCH 30.6  26.0 - 34.0 pg   MCHC 35.0  30.0 - 36.0 g/dL   RDW 12.2  11.5 - 15.5 %   Platelets 240  150 - 400 K/uL  CREATININE, SERUM     Status: Abnormal   Collection Time    04/05/13 11:36 AM      Result Value Ref Range   Creatinine, Ser 1.24  0.50 - 1.35 mg/dL   GFR calc non Af Amer 71 (*) >90 mL/min   GFR calc Af Amer 82 (*) >90 mL/min   Comment: (NOTE)     The eGFR has been calculated using the CKD EPI equation.     This calculation has not been validated in all clinical situations.     eGFR's persistently <90 mL/min signify possible Chronic Kidney     Disease.  CBC     Status: Abnormal   Collection Time    04/06/13  4:27 AM      Result Value Ref Range   WBC 9.4  4.0 - 10.5 K/uL   RBC 4.36  4.22 - 5.81 MIL/uL   Hemoglobin 12.9 (*) 13.0 - 17.0 g/dL   HCT 38.3 (*) 39.0 -  52.0 %   MCV 87.8  78.0 - 100.0 fL   MCH 29.6  26.0 - 34.0 pg   MCHC 33.7  30.0 - 36.0 g/dL   RDW 12.3  11.5 - 15.5 %   Platelets 253  150 - 400 K/uL  BASIC METABOLIC PANEL     Status: Abnormal   Collection Time    04/06/13  4:27 AM      Result Value Ref Range   Sodium 140  137 - 147 mEq/L   Potassium 4.2  3.7 - 5.3 mEq/L   Chloride 101  96 - 112 mEq/L   CO2 29  19 - 32 mEq/L   Glucose, Bld 126 (*) 70 - 99 mg/dL   BUN 10  6 - 23 mg/dL   Creatinine, Ser 1.12  0.50 - 1.35 mg/dL   Calcium 8.8  8.4 - 10.5 mg/dL   GFR calc non Af Amer 80 (*) >90 mL/min   GFR calc  Af Amer >90  >90 mL/min   Comment: (NOTE)     The eGFR has been calculated using the CKD EPI equation.     This calculation has not been validated in all clinical situations.     eGFR's persistently <90 mL/min signify possible Chronic Kidney     Disease.    Radiology/Results: No results found.  Anti-infectives: Anti-infectives   Start     Dose/Rate Route Frequency Ordered Stop   04/05/13 0550  cefOXitin (MEFOXIN) 2 g in dextrose 5 % 50 mL IVPB     2 g 100 mL/hr over 30 Minutes Intravenous On call to O.R. 04/05/13 0550 04/05/13 0739      Assessment/Plan: Problem List: Patient Active Problem List   Diagnosis Date Noted  . Lap Nissen over 53 Fr with single suture closure of diaphragm 04/05/2013  . Right inguinal hernia 04/05/2013  . Skin eruption 02/25/2013  . GERD (gastroesophageal reflux disease) 01/25/2013  . CAP (community acquired pneumonia) 08/30/2012  . AKI (acute kidney injury) 08/30/2012  . 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  . Hypoxia 08/30/2012  . Barrett esophagus 08/30/2012  . OSA (obstructive sleep apnea) 08/30/2012    Start clear liquids 1 Day Post-Op    LOS: 1 day   Matt B. Hassell Done, MD, [facility] [phone] beeper [phone]  04/06/2013 9:51 AM

## 2013-04-07 LAB — CBC
HCT: 35.1 % — ABNORMAL LOW (ref 39.0–52.0)
HEMOGLOBIN: 11.8 g/dL — AB (ref 13.0–17.0)
MCH: 29.9 pg (ref 26.0–34.0)
MCHC: 33.6 g/dL (ref 30.0–36.0)
MCV: 89.1 fL (ref 78.0–100.0)
Platelets: 211 10*3/uL (ref 150–400)
RBC: 3.94 MIL/uL — ABNORMAL LOW (ref 4.22–5.81)
RDW: 12.4 % (ref 11.5–15.5)
WBC: 5.6 10*3/uL (ref 4.0–10.5)

## 2013-04-07 LAB — BASIC METABOLIC PANEL
BUN: 8 mg/dL (ref 6–23)
CHLORIDE: 102 meq/L (ref 96–112)
CO2: 30 mEq/L (ref 19–32)
Calcium: 8.6 mg/dL (ref 8.4–10.5)
Creatinine, Ser: 1.12 mg/dL (ref 0.50–1.35)
GFR calc non Af Amer: 80 mL/min — ABNORMAL LOW (ref >90)
Glucose, Bld: 104 mg/dL — ABNORMAL HIGH (ref 70–99)
POTASSIUM: 4.1 meq/L (ref 3.7–5.3)
Sodium: 140 mEq/L (ref 137–147)

## 2013-04-07 MED ORDER — HYDROCODONE-ACETAMINOPHEN 7.5-325 MG/15ML PO SOLN: 10.0000 mL | ORAL | Status: DC | PRN

## 2013-04-07 NOTE — Progress Notes (Signed)
Patient ID: John [name], male   DOB: 13-Oct-1970, 43 y.o.   MRN: [id number] [facility] Surgery Progress Note:   2 Days Post-Op  Subjective: Mental status is clear.   Objective: Vital signs in last 24 hours: Temp:  [97.6 F (36.4 C)-97.9 F (36.6 C)] 97.6 F (36.4 C) (02/22 0520) Pulse Rate:  [65-93] 65 (02/22 0520) Resp:  [17-18] 18 (02/22 0520) BP: (113-135)/(70-86) 113/70 mmHg (02/22 0520) SpO2:  [92 %-97 %] 97 % (02/22 0520)  Intake/Output from previous day: 02/21 0701 - 02/22 0700 In: 2636.7 [P.O.:660; I.V.:1976.7] Out: 1250 [Urine:1250] Intake/Output this shift:    Physical Exam: Work of breathing is normal.  Minimal abdominal pain.    Lab Results:  Results for orders placed during the hospital encounter of 04/05/13 (from the past 48 hour(s))  CBC     Status: Abnormal   Collection Time    04/05/13 11:36 AM      Result Value Ref Range   WBC 11.8 (*) 4.0 - 10.5 K/uL   RBC 4.61  4.22 - 5.81 MIL/uL   Hemoglobin 14.1  13.0 - 17.0 g/dL   HCT 43.2  46.9 - 97.8 %   MCV 87.4  78.0 - 100.0 fL   MCH 30.6  26.0 - 34.0 pg   MCHC 35.0  30.0 - 36.0 g/dL   RDW 02.0  89.1 - 00.2 %   Platelets 240  150 - 400 K/uL  CREATININE, SERUM     Status: Abnormal   Collection Time    04/05/13 11:36 AM      Result Value Ref Range   Creatinine, Ser 1.24  0.50 - 1.35 mg/dL   GFR calc non Af Amer 71 (*) >90 mL/min   GFR calc Af Amer 82 (*) >90 mL/min   Comment: (NOTE)     The eGFR has been calculated using the CKD EPI equation.     This calculation has not been validated in all clinical situations.     eGFR's persistently <90 mL/min signify possible Chronic Kidney     Disease.  CBC     Status: Abnormal   Collection Time    04/06/13  4:27 AM      Result Value Ref Range   WBC 9.4  4.0 - 10.5 K/uL   RBC 4.36  4.22 - 5.81 MIL/uL   Hemoglobin 12.9 (*) 13.0 - 17.0 g/dL   HCT 62.8 (*) 54.9 - 65.6 %   MCV 87.8  78.0 - 100.0 fL   MCH 29.6  26.0 - 34.0 pg   MCHC 33.7  30.0 - 36.0  g/dL   RDW 59.9  43.7 - 19.0 %   Platelets 253  150 - 400 K/uL  BASIC METABOLIC PANEL     Status: Abnormal   Collection Time    04/06/13  4:27 AM      Result Value Ref Range   Sodium 140  137 - 147 mEq/L   Potassium 4.2  3.7 - 5.3 mEq/L   Chloride 101  96 - 112 mEq/L   CO2 29  19 - 32 mEq/L   Glucose, Bld 126 (*) 70 - 99 mg/dL   BUN 10  6 - 23 mg/dL   Creatinine, Ser 7.07  0.50 - 1.35 mg/dL   Calcium 8.8  8.4 - 21.7 mg/dL   GFR calc non Af Amer 80 (*) >90 mL/min   GFR calc Af Amer >90  >90 mL/min   Comment: (NOTE)     The  eGFR has been calculated using the CKD EPI equation.     This calculation has not been validated in all clinical situations.     eGFR's persistently <90 mL/min signify possible Chronic Kidney     Disease.  CBC     Status: Abnormal   Collection Time    04/07/13  4:46 AM      Result Value Ref Range   WBC 5.6  4.0 - 10.5 K/uL   RBC 3.94 (*) 4.22 - 5.81 MIL/uL   Hemoglobin 11.8 (*) 13.0 - 17.0 g/dL   HCT 35.1 (*) 39.0 - 52.0 %   MCV 89.1  78.0 - 100.0 fL   MCH 29.9  26.0 - 34.0 pg   MCHC 33.6  30.0 - 36.0 g/dL   RDW 12.4  11.5 - 15.5 %   Platelets 211  150 - 400 K/uL  BASIC METABOLIC PANEL     Status: Abnormal   Collection Time    04/07/13  4:46 AM      Result Value Ref Range   Sodium 140  137 - 147 mEq/L   Potassium 4.1  3.7 - 5.3 mEq/L   Chloride 102  96 - 112 mEq/L   CO2 30  19 - 32 mEq/L   Glucose, Bld 104 (*) 70 - 99 mg/dL   BUN 8  6 - 23 mg/dL   Creatinine, Ser 1.12  0.50 - 1.35 mg/dL   Calcium 8.6  8.4 - 10.5 mg/dL   GFR calc non Af Amer 80 (*) >90 mL/min   GFR calc Af Amer >90  >90 mL/min   Comment: (NOTE)     The eGFR has been calculated using the CKD EPI equation.     This calculation has not been validated in all clinical situations.     eGFR's persistently <90 mL/min signify possible Chronic Kidney     Disease.    Radiology/Results: US Abdomen Complete  04/06/2013   CLINICAL DATA:  One day post laparoscopic Nissen fundoplication.  Adhesions involving the gallbladder fundus noted at the time of surgery.  EXAM: ULTRASOUND ABDOMEN COMPLETE  COMPARISON:  US ABDOMEN COMPLETE dated 07/17/2012; CT ABD/PELV WO CM dated 04/15/2010; US ABDOMEN COMPLETE dated 06/01/2007  FINDINGS: Gallbladder:  7 mm shadowing gallstone near the gallbladder neck. No gallbladder wall thickening or pericholecystic fluid. Negative sonographic Murphy sign according to the ultrasound technologist.  Common bile duct:  Diameter: Approximately 3 mm.  Liver:  Normal size and echotexture without focal parenchymal abnormality. Patent portal vein with hepatopetal flow.  IVC:  Patent.  Pancreas:  While difficult to visualize in its entirety, visualized portions normal in appearance.  Spleen:  Normal size and echotexture without focal parenchymal abnormality.  Right Kidney:  Length: Approximately 10.5 cm. No hydronephrosis. Well-preserved cortex. No shadowing calculi. Normal parenchymal echotexture without focal abnormalities.  Left Kidney:  Length: Approximately 10.9 cm. No hydronephrosis. Well-preserved cortex. No shadowing calculi. Normal parenchymal echotexture without focal abnormalities.  Abdominal aorta:  Normal in caliber throughout its visualized course in the abdomen without evidence of significant atherosclerosis. Maximum diameter 1.9 cm.  Other findings:  None.  IMPRESSION: 1. Cholelithiasis without sonographic evidence of acute cholecystitis. 2. Otherwise normal examination.   Electronically Signed   By: Evangeline Dakin M.D.   On: 04/06/2013 22:08   Dg Ugi W/water Sol Cm  04/06/2013   CLINICAL DATA:  Status post Nissen fundoplication  EXAM: ESOPHOGRAM/BARIUM SWALLOW  TECHNIQUE: Single contrast examination was performed using water-soluble contour.  COMPARISON:  None.  FLUOROSCOPY TIME:  56 seconds  FINDINGS: Contrast easily passes from the lower esophagus into the stomach. There is no evidence of stricture or delayed transit. No evidence of extravasation to suggest leak.   IMPRESSION: Within normal limits.  No evidence of stricture or leak.   Electronically Signed   By: Maryclare Bean M.D.   On: 04/06/2013 13:59    Anti-infectives: Anti-infectives   Start     Dose/Rate Route Frequency Ordered Stop   04/05/13 0550  cefOXitin (MEFOXIN) 2 g in dextrose 5 % 50 mL IVPB     2 g 100 mL/hr over 30 Minutes Intravenous On call to O.R. 04/05/13 0550 04/05/13 0739      Assessment/Plan: Problem List: Patient Active Problem List   Diagnosis Date Noted  . Lap Nissen over 35 Fr with single suture closure of diaphragm 04/05/2013  . Right inguinal hernia 04/05/2013  . Skin eruption 02/25/2013  . GERD (gastroesophageal reflux disease) 01/25/2013  . CAP (community acquired pneumonia) 08/30/2012  . AKI (acute kidney injury) 08/30/2012  . 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  . Hypoxia 08/30/2012  . Barrett esophagus 08/30/2012  . OSA (obstructive sleep apnea) 08/30/2012    Gallstones by ultrasound.  Advancing slowly.  May be going home tomorrow 2 Days Post-Op    LOS: 2 days   Matt B. Hassell Done, MD, [facility] [phone] beeper [phone]  04/07/2013 8:22 AM

## 2013-04-08 ENCOUNTER — Telehealth (INDEPENDENT_AMBULATORY_CARE_PROVIDER_SITE_OTHER): Payer: Self-pay | Admitting: General Surgery

## 2013-04-08 ENCOUNTER — Encounter (HOSPITAL_COMMUNITY): Payer: Self-pay | Admitting: Surgery

## 2013-04-08 NOTE — Telephone Encounter (Signed)
Pt's wife calling (with pt audible in background) to report the Hycet is ineffective for his pain ability to control pain.  He is taking the prescribed amount Q3H as ordered.  Please advise.

## 2013-04-09 ENCOUNTER — Telehealth (INDEPENDENT_AMBULATORY_CARE_PROVIDER_SITE_OTHER): Payer: Self-pay | Admitting: General Surgery

## 2013-04-09 NOTE — Telephone Encounter (Signed)
Pt's wife called again about (1) needs follow up appt from Nissen surgery, (2) needs to know when and how to advance diet and (3) needs refill of pain medicine.  He is not pain-controlled on Hycet.  Paged and updated Dr. Hassell Done.  He ordered pt to begin pureed food after one week of clear and full liquids.  Discussed with wife what and how to advance diet and to dilute the first pureed food to see how he tolerates it first.  She is able to verbalize understanding and was writing down instructions as well.  Dr. Hassell Done okay a refill of the Hycet and added ibuprofen.  Wife understands to buy the liquid ibuprofen, and since it is usually only available for child dosing, may need to buy more bottles to achieve the right dosage.  Ask pharmacist for help if needed.  Hycet 7.5/ 325 mg per 15 ml, # 120 ml, 10 ml po Q3H prn pain, no refill signed in office by Dr. Redmond Pulling and placed at the front desk for pick-up.  Wife aware she must claim it with photo ID for him.  Appt made for 1st post op follow up on Thursday, April 18, 2013.

## 2013-04-16 NOTE — Discharge Summary (Addendum)
Physician Discharge Summary  Patient ID: John [name] MRN: [id number] DOB/AGE: May 05, 1970 43 y.o.  Admit date: 04/05/2013 Discharge date: 04/16/2013  Admission Diagnoses:  GERD  Discharge Diagnoses:  Same post Nissen fundoplication  Active Problems:   GERD (gastroesophageal reflux disease)   Lap Nissen over 66 Fr with single suture closure of diaphragm   Right inguinal hernia   Surgery:  Laparoscopic Nissen fundoplication  Discharged Condition: improved  Hospital Course:   Had surgery.  UGI on PD 1 looked good.  Started on clears and advance to full liquids.  Ultrasound confirmed gallstones  Consults: none  Significant Diagnostic Studies: UGI and ultrasound    Discharge Exam: Blood pressure 124/77, pulse 73, temperature 97.9 F (36.6 C), temperature source Oral, resp. rate 18, height 5\' 8"  (1.727 m), weight 196 lb (88.905 kg), SpO2 96.00%. Incisions OK.  Minimally sore  Disposition: 01-Home or Self Care  Discharge Orders   Future Appointments Provider Department Dept Phone   04/18/2013 10:40 AM Pedro Earls, MD [facility] [place]   04/24/2013 1:30 PM Charolette Forward, NP [facility] [phone]   Future Orders Complete By Expires   Diet - low sodium heart healthy  As directed    Discharge instructions  As directed    Comments:     [facility]  LAPAROSCOPIC SURGERY - POST-OP INSTRUCTIONS  Always review your discharge instruction sheet given to you by the facility where your surgery was performed.  A prescription for pain medication may be given to you upon discharge.  Take your pain medication as prescribed.  If narcotic pain medicine is not needed, then you may take acetaminophen (Tylenol) or ibuprofen (Advil) as needed.  Take your usually prescribed medications unless otherwise directed.  If you need a refill on your pain medication, please contact your pharmacy.  They will contact our office to  request authorization. Prescriptions will not be filled after 5 P.M. or on weekends.  You should follow a light diet the first few days after arrival home, such as soup and crackers or toast.  Be sure to include plenty of fluids daily.  Most patients will experience some swelling and bruising in the area of the incisions.  Ice packs will help.  Swelling and bruising can take several days to resolve.   It is common to experience some constipation if taking pain medication after surgery.  Increasing fluid intake and taking a stool softener (such as Colace) will usually help or prevent this problem from occurring.  A mild laxative (Milk of Magnesia or Miralax) should be taken according to package instructions if there are no bowel movements after 48 hours.  Unless discharge instructions indicate otherwise, you may remove your bandages 24-48 hours after surgery, and you may shower at that time.  You may have steri-strips (small skin tapes) in place directly over the incision.  These strips should be left on the skin for 7-10 days.  If your surgeon used skin glue on the incision, you may shower in 24 hours.  The glue will flake off over the next 2-3 weeks.  Any sutures or staples will be removed at the office during your follow-up visit.  ACTIVITIES:  You may resume regular (light) daily activities beginning the next day-such as daily self-care, walking, climbing stairs-gradually increasing activities as tolerated.  You may have sexual intercourse when it is comfortable.  Refrain from any heavy lifting or straining until approved by your doctor.  You may drive  when you are no longer taking prescription pain medication, you can comfortably wear a seatbelt, and you can safely maneuver your car and apply brakes.  You should see your doctor in the office for a follow-up appointment approximately 2-3 weeks after your surgery.  Make sure that you call for this appointment within a day or two after you arrive home  to insure a convenient appointment time.  WHEN TO CALL YOUR DOCTOR: Fever over 101.0 Inability to urinate Continued bleeding from incision Increased pain, redness, or drainage from the incision Increasing abdominal pain  The clinic staff is available to answer your questions during regular business hours.  Please don't hesitate to call and ask to speak to one of the nurses for clinical concerns.  If you have a medical emergency, go to the nearest emergency room or call 911.  A surgeon from [facility] is always on call for the hospital.  Earnstine Regal, MD, [facility] Office: [place] Free:  [phone] FAX [phone]  Web site: [email or web address]   Increase activity slowly  As directed    Remove dressing in 24 hours  As directed        Medication List         baclofen 10 MG tablet  Commonly known as:  LIORESAL  Take 10-20 mg by mouth 2 (two) times daily as needed (for muscle spasms).     CAMBIA 50 MG Pack  Generic drug:  Diclofenac Potassium  Take 50 mg by mouth 2 (two) times daily as needed. Migraines     esomeprazole 40 MG capsule  Commonly known as:  NEXIUM  Take 40 mg by mouth daily before breakfast.     furosemide 20 MG tablet  Commonly known as:  LASIX  Take 20 mg by mouth every morning.     HYDROcodone-acetaminophen 7.5-325 mg/15 ml solution  Commonly known as:  HYCET  Take 10 mLs by mouth every 3 (three) hours as needed for moderate pain.     lamoTRIgine 200 MG tablet  Commonly known as:  LAMICTAL  Take 200 mg by mouth every evening.     lisinopril 10 MG tablet  Commonly known as:  PRINIVIL,ZESTRIL  Take 10 mg by mouth every morning.         SignedPedro Earls 04/16/2013, 8:03 AM   The discharge date inserted by EPIC in this document is wrong.  He went home on March 22nd.

## 2013-04-18 ENCOUNTER — Encounter (INDEPENDENT_AMBULATORY_CARE_PROVIDER_SITE_OTHER): Payer: 59 | Admitting: Surgery

## 2013-04-23 ENCOUNTER — Telehealth (INDEPENDENT_AMBULATORY_CARE_PROVIDER_SITE_OTHER): Payer: Self-pay

## 2013-04-23 NOTE — Telephone Encounter (Signed)
Called and spoke to patient with appt date & time for 05/03/13 @ 11:20am w/Dr. Hassell Done. Patient's appt was r/s due to death in family and inclement weather

## 2013-04-23 NOTE — Telephone Encounter (Signed)
Message copied by Ivor Costa on Tue Apr 23, 2013 10:36 AM ------      Message from: Joselyn Arrow      Created: Wed Apr 17, 2013 12:46 PM      Contact: [phone]       PT REQ R/S APPT SCHED May 05, 2013/DUE TO DEATH IN FAMILY/CALL PT TO R/S GM ------

## 2013-04-24 ENCOUNTER — Ambulatory Visit: Payer: 59 | Admitting: Adult Health

## 2013-05-03 ENCOUNTER — Ambulatory Visit (INDEPENDENT_AMBULATORY_CARE_PROVIDER_SITE_OTHER): Payer: 59 | Admitting: Surgery

## 2013-05-03 ENCOUNTER — Encounter (INDEPENDENT_AMBULATORY_CARE_PROVIDER_SITE_OTHER): Payer: Self-pay | Admitting: Surgery

## 2013-05-03 VITALS — BP 118/80 | HR 80 | Temp 98.0°F | Resp 14 | Ht 68.0 in | Wt 179.6 lb

## 2013-05-03 DIAGNOSIS — Z09 Encounter for follow-up examination after completed treatment for conditions other than malignant neoplasm: Secondary | ICD-10-CM

## 2013-05-03 DIAGNOSIS — Z9889 Other specified postprocedural states: Secondary | ICD-10-CM

## 2013-05-03 MED ORDER — HYDROCODONE-ACETAMINOPHEN 7.5-325 MG/15ML PO SOLN: 10.0000 mL | ORAL | Status: DC | PRN

## 2013-05-03 NOTE — Patient Instructions (Signed)
Nissen Fundoplication Care After Please read the instructions outlined below and refer to this sheet for the next few weeks. These discharge instructions provide you with general information on caring for yourself after you leave the hospital. Your doctor may also give you specific instructions. While your treatment has been planned according to the most current medical practices available, unavoidable complications sometimes happen. If you have any problems or questions after discharge, please call your doctor. ACTIVITY  Take frequent rest periods throughout the day.  Take frequent walks throughout the day. This will help to prevent blood clots.  Continue to do your coughing and deep breathing exercises once you get home. This will help to prevent pneumonia.  No strenuous activities such as heavy lifting, pushing or pulling until after your follow-up visit with your doctor. Do not lift anything heavier than 10 pounds.  Talk with your caregiver about when you may return to work and your exercise routine.  You may shower 2 days after surgery. Pat incisions dry. Do not rub incisions with washcloth or towel.  Do not drive while taking prescription pain medication. NUTRITION  Continue with a liquid diet, or the diet you were directed to take, until your first follow-up visit with your surgeon.  Drink fluids (6-8 glasses a day).  Call your caregiver for persistent nausea (feeling sick to your stomach), vomiting, bloating or difficulty swallowing. ELIMINATION It is very important not to strain during bowel movements. If constipation should occur, you may:  Take a mild laxative (such as Milk of Magnesia).  Add fruit and bran to your diet.  Drink more fluids.  Call your caregiver if constipation is not relieved. FEVER If you feel feverish or have shaking chills, take your temperature. If it is 102 F (38.9 C) or above, call your caregiver. The fever may mean there is an infection. PAIN  CONTROL  If a prescription was given for a pain reliever, please follow your caregiver's directions.  Only take over-the-counter or prescription medicines for pain, discomfort, or fever as directed by your caregiver.  If the pain is not relieved by your medicine, becomes worse, or you have difficulty breathing, call your doctor. INCISION  It is normal for your cuts (incisions) from surgery to have a small amount of drainage for the first 1-2 days. Once the drainage has stopped, leave your incision(s) open to air.  Check your incision(s) and surrounding area daily for any redness, swelling, increased drainage or bleeding. If any of these are present or if the wound edges start to separate, call your doctor.  If you have small adhesive strips in place, they will peel and fall off. (If these strips are covered with a clear bandage, your doctor will tell you when to remove them.)  If you have staples, your caregiver will remove them at the follow-up appointment. Document Released: 09/24/2003 Document Revised: 04/25/2011 Document Reviewed: 12/28/2006 Exit[employer] Patient Information 2014 [employer].  

## 2013-05-03 NOTE — Progress Notes (Signed)
John [name] III 43 y.o.  Body mass index is 27.31 kg/(m^2).  Patient Active Problem List   Diagnosis Date Noted  . Lap Nissen over 44 Fr with single suture closure of diaphragm 04/05/2013  . Right inguinal hernia 04/05/2013  . Skin eruption 02/25/2013  . GERD (gastroesophageal reflux disease) 01/25/2013  . CAP (community acquired pneumonia) 08/30/2012  . AKI (acute kidney injury) 08/30/2012  . 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  . Hypoxia 08/30/2012  . Barrett esophagus 08/30/2012  . OSA (obstructive sleep apnea) 08/30/2012    Allergies  Allergen Reactions  . Doxycycline Other (See Comments)    Burning in throat  . Nitroglycerin Other (See Comments)    Severe headache    Past Surgical History  Procedure Laterality Date  . Appendectomy    . Upper gi endoscopy  2013  . Esophageal manometry N/A 11/12/2012    Procedure: ESOPHAGEAL MANOMETRY (EM);  Surgeon: Beryle Beams, MD;  Location: [facility] END[facility]COPY;  Service: Endoscopy;  Laterality: N/A;  . Nasal septum surgery    . Laparoscopic nissen fundoplication N/A 2/29/7989    Procedure: LAPAROSCOPIC NISSEN FUNDOPLICATION;  Surgeon: Pedro Earls, MD;  Location: [facility] ORS;  Service: General;  Laterality: N/A;   Jerene Pitch, MD No diagnosis found.  One month out from lap nissen.  Having some issues with dysphagia and he mentioned having it with bread which is not a surprise.  His incisions look good.  I advised him to take some of his large pills with veg oil on them.  He is crushing them.  I will see him back in 6 weeks.   Matt B. Hassell Done, MD, [facility] [phone] beeper [phone]  05/03/2013 12:35 PM

## 2013-05-22 ENCOUNTER — Encounter: Payer: Self-pay | Admitting: Adult Health

## 2013-05-22 ENCOUNTER — Ambulatory Visit (INDEPENDENT_AMBULATORY_CARE_PROVIDER_SITE_OTHER): Payer: 59 | Admitting: Adult Health

## 2013-05-22 VITALS — BP 100/52 | HR 90 | Temp 98.6°F | Resp 14 | Ht 68.8 in | Wt 178.0 lb

## 2013-05-22 DIAGNOSIS — R899 Unspecified abnormal finding in specimens from other organs, systems and tissues: Secondary | ICD-10-CM

## 2013-05-22 DIAGNOSIS — R6889 Other general symptoms and signs: Secondary | ICD-10-CM

## 2013-05-22 DIAGNOSIS — K760 Fatty (change of) liver, not elsewhere classified: Secondary | ICD-10-CM

## 2013-05-22 DIAGNOSIS — K7689 Other specified diseases of liver: Secondary | ICD-10-CM

## 2013-05-22 LAB — BASIC METABOLIC PANEL
BUN: 12 mg/dL (ref 6–23)
CHLORIDE: 101 meq/L (ref 96–112)
CO2: 31 mEq/L (ref 19–32)
CREATININE: 1.1 mg/dL (ref 0.4–1.5)
Calcium: 9.6 mg/dL (ref 8.4–10.5)
GFR: 79.87 mL/min (ref >60.00)
GLUCOSE: 100 mg/dL — AB (ref 70–99)
Potassium: 3.8 mEq/L (ref 3.5–5.1)
Sodium: 138 mEq/L (ref 135–145)

## 2013-05-22 LAB — HEPATIC FUNCTION PANEL
ALK PHOS: 97 U/L (ref 39–117)
ALT: 29 U/L (ref 0–53)
AST: 26 U/L (ref 0–37)
Albumin: 4.3 g/dL (ref 3.5–5.2)
BILIRUBIN DIRECT: 0.1 mg/dL (ref 0.0–0.3)
TOTAL PROTEIN: 6.8 g/dL (ref 6.0–8.3)
Total Bilirubin: 0.8 mg/dL (ref 0.3–1.2)

## 2013-05-22 MED ORDER — LISINOPRIL 10 MG PO TABS: 10.0000 mg | ORAL_TABLET | Freq: Every morning | ORAL | Status: DC

## 2013-05-22 NOTE — Progress Notes (Signed)
Pre visit review using our clinic review tool, if applicable. No additional management support is needed unless otherwise documented below in the visit note. 

## 2013-05-22 NOTE — Progress Notes (Signed)
Patient ID: John [name], male   DOB: 1970/10/08, 43 y.o.   MRN: [id number]    Subjective:    Patient ID: John [name], male    DOB: May 17, 1970, 43 y.o.   MRN: [id number]  HPI  Pt is a pleasant 43 y/o male who presents to establish care. Previously followed at [facility]. Will request records. He reports being seen last at the [place] [facility] approximately 2-3 months ago with a swollen lymph node on his neck. He was supposed to have some scan of his neck/thyroid (he is not able to explain details) but he was never called for an appointment. I will request these records. He also reports having abnormal labs while hospitalized for pneumonia. He would like to have his kidney function checked as well as his liver function. Patient reports being on Lamictal for control of his chronic headaches.   Past Medical History  Diagnosis Date  . Barrett esophagus   . Heart murmur     at birth  . Hypertension   . Headache(784.0)     chronic  . Sleep apnea     uses cpap  . Pneumonia 08/2012  . GERD (gastroesophageal reflux disease)   . H/O hiatal hernia   . Migraine   . BPH (benign prostatic hyperplasia)     Dr. Bernardo Heater  . Fatty liver   . History of kidney stones      Past Surgical History  Procedure Laterality Date  . Appendectomy    . Upper gi endoscopy  2013  . Esophageal manometry N/A 11/12/2012    Procedure: ESOPHAGEAL MANOMETRY (EM);  Surgeon: Beryle Beams, MD;  Location: [facility];  Service: Endoscopy;  Laterality: N/A;  . Nasal septum surgery    . Laparoscopic nissen fundoplication N/A 03/10/5807    Procedure: LAPAROSCOPIC NISSEN FUNDOPLICATION;  Surgeon: Pedro Earls, MD;  Location: [facility];  Service: General;  Laterality: N/A;     Family History  Problem Relation Age of Onset  . Cancer Father     skin  . Diabetes Father   . Cancer Paternal Aunt     breast  . Cancer Mother     pancreatic 2010  . Hypertension Mother      History   Social History  .  Marital Status: Married    Spouse Name: N/A    Number of Children: 1  . Years of Education: 14   Occupational History  . Disabled     Chronic Headaches   Social History Main Topics  . Smoking status: Never Smoker   . Smokeless tobacco: Never Used  . Alcohol Use: No  . Drug Use: No  . Sexual Activity: Not on file   Other Topics Concern  . Not on file   Social History Narrative   [name] grew up in [place]. He lives at home with his wife and daughter. He used to work in [employer]. Now he is disabled 2/2 severe, chronic headaches. He enjoys playing video games occasional with his daughter. Enjoys watching movies.      No caffeine   No smoking   Does not exercise          Current Outpatient Prescriptions on File Prior to Visit  Medication Sig Dispense Refill  . baclofen (LIORESAL) 10 MG tablet Take 10-20 mg by mouth 2 (two) times daily as needed (for muscle spasms).       . Diclofenac Potassium (CAMBIA) 50 MG PACK Take 50 mg  by mouth 2 (two) times daily as needed. Migraines      . esomeprazole (NEXIUM) 40 MG capsule Take 40 mg by mouth daily before breakfast.      . HYDROcodone-acetaminophen (HYCET) 7.5-325 mg/15 ml solution Take 10 mLs by mouth every 3 (three) hours as needed for moderate pain.  300 mL  0  . ibuprofen (ADVIL,MOTRIN) 200 MG tablet Take 200 mg by mouth every 6 (six) hours as needed for moderate pain.      Marland Kitchen lamoTRIgine (LAMICTAL) 200 MG tablet Take 200 mg by mouth every evening.       Marland Kitchen lisinopril (PRINIVIL,ZESTRIL) 10 MG tablet Take 10 mg by mouth every morning.       No current facility-administered medications on file prior to visit.     Review of Systems  Constitutional: Negative.   Eyes: Negative.   Respiratory: Negative.   Cardiovascular: Negative.   Gastrointestinal: Negative.        GERD s/p Niesen fundoplication  Endocrine: Negative.   Genitourinary: Negative.  Negative for urgency, frequency, hematuria, flank pain and  difficulty urinating.  Musculoskeletal: Negative.   Skin: Negative.   Allergic/Immunologic: Negative.   Neurological: Positive for headaches (chronic headaches).  Hematological: Negative.   Psychiatric/Behavioral: Negative.        Objective:  BP 100/52  Pulse 90  Temp(Src) 98.6 F (37 C) (Oral)  Resp 14  Ht 5' 8.8" (1.748 m)  Wt 178 lb (80.74 kg)  BMI 26.42 kg/m2  SpO2 94%   Physical Exam  Constitutional: He is oriented to person, place, and time. He appears well-developed and well-nourished. No distress.  HENT:  Head: Normocephalic and atraumatic.  Right Ear: External ear normal.  Left Ear: External ear normal.  Nose: Nose normal.  Mouth/Throat: Oropharynx is clear and moist.  Eyes: Conjunctivae and EOM are normal. Pupils are equal, round, and reactive to light.  Neck: Normal range of motion. Neck supple. No tracheal deviation present. No thyromegaly present.  Cardiovascular: Normal rate, regular rhythm, normal heart sounds and intact distal pulses.  Exam reveals no gallop and no friction rub.   No murmur heard. Pulmonary/Chest: Effort normal and breath sounds normal. No respiratory distress. He has no wheezes. He has no rales.  Abdominal: Soft. Bowel sounds are normal. He exhibits no distension and no mass. There is no tenderness. There is no rebound and no guarding.  Musculoskeletal: Normal range of motion. He exhibits no edema and no tenderness.  Lymphadenopathy:    He has no cervical adenopathy.  Neurological: He is alert and oriented to person, place, and time. He has normal reflexes. No cranial nerve deficit. Coordination normal.  Skin: Skin is warm and dry.  Psychiatric: He has a normal mood and affect. His behavior is normal. Judgment and thought content normal.       Assessment & Plan:   1. Abnormal laboratory test Reports having abnormal kidney function result during hospitalization for pneumonia. He has requested this be rechecked today. He is on lamictal -  Basic metabolic panel  2. Fatty liver Hx of fatty liver. Check liver panel. Continue to follow. - Hepatic function panel

## 2013-05-22 NOTE — Patient Instructions (Signed)
  Thank you for choosing  at [facility] for your health care needs.  Please have your labs drawn prior to leaving the office.  The results will be available through MyChart for your convenience. Please remember to activate this. The activation code is located at the end of this form.  I will request records from [facility].

## 2013-06-06 ENCOUNTER — Encounter (INDEPENDENT_AMBULATORY_CARE_PROVIDER_SITE_OTHER): Payer: Self-pay | Admitting: Surgery

## 2013-06-06 ENCOUNTER — Ambulatory Visit (INDEPENDENT_AMBULATORY_CARE_PROVIDER_SITE_OTHER): Payer: 59 | Admitting: Surgery

## 2013-06-06 VITALS — BP 102/70 | HR 72 | Temp 97.8°F | Resp 14 | Ht 68.0 in | Wt 175.0 lb

## 2013-06-06 DIAGNOSIS — K8010 Calculus of gallbladder with chronic cholecystitis without obstruction: Secondary | ICD-10-CM

## 2013-06-06 HISTORY — DX: Calculus of gallbladder with chronic cholecystitis without obstruction: K80.10

## 2013-06-06 NOTE — Progress Notes (Signed)
John [name] 43 y.o.  Body mass index is 26.61 kg/(m^2).  Patient Active Problem List   Diagnosis Date Noted  . 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/06/2013  . Lap Nissen over 31 Fr with single suture closure of diaphragm 04/05/2013  . Right inguinal hernia 04/05/2013  . Skin eruption 02/25/2013  . GERD (gastroesophageal reflux disease) 01/25/2013  . CAP (community acquired pneumonia) 08/30/2012  . AKI (acute kidney injury) 08/30/2012  . 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  . Hypoxia 08/30/2012  . Barrett esophagus 08/30/2012  . OSA (obstructive sleep apnea) 08/30/2012    Allergies  Allergen Reactions  . Doxycycline Other (See Comments)    Burning in throat  . Nitroglycerin Other (See Comments)    Severe headache    Past Surgical History  Procedure Laterality Date  . Appendectomy    . Upper gi endoscopy  2013  . Esophageal manometry N/A 11/12/2012    Procedure: ESOPHAGEAL MANOMETRY (EM);  Surgeon: Beryle Beams, MD;  Location: [facility];  Service: Endoscopy;  Laterality: N/A;  . Nasal septum surgery    . Laparoscopic nissen fundoplication N/A 03/04/1476    Procedure: LAPAROSCOPIC NISSEN FUNDOPLICATION;  Surgeon: Pedro Earls, MD;  Location: [facility];  Service: General;  Laterality: N/A;   Jerene Pitch, MD 1. Chronic cholecystitis with calculus     followup after laparoscopic Nissen fundoplication. The usual postop dysphagia stating and he is doing much better with liquids and solids. Think he's got back to normal.  We discussed 2 other findings that came up during his surgery. 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 I told him to be mindful of this in case he is recurrent bouts of upper bowel pain nausea vomiting. Also shared this with his gastroenterologist Dr. Benson Norway who will be following him for his Barrett's esophagus. Second wall he has a small right inguinal hernia that although palpable is  really pretty small and I think to be managed expectantly.  I be happy to see me again on an as needed basis. We gave the gallbladder book so that he can be more informed about gallbladder symptoms and I would be happy to see him and remove his gallbladder if become symptomatic. Matt B. Hassell Done, MD, [facility] [phone] beeper [phone]  06/06/2013 12:33 PM

## 2013-06-06 NOTE — Patient Instructions (Signed)
Laparoscopic Cholecystectomy °Laparoscopic cholecystectomy is surgery to remove the gallbladder. The gallbladder is located in the upper right part of the abdomen, behind the liver. It is a storage sac for bile produced in the liver. Bile aids in the digestion and absorption of fats. Cholecystectomy is often done for inflammation of the gallbladder (cholecystitis). This condition is usually caused by a buildup of gallstones (cholelithiasis) in your gallbladder. Gallstones can block the flow of bile, resulting in inflammation and pain. In severe cases, emergency surgery may be required. When emergency surgery is not required, you will have time to prepare for the procedure. °Laparoscopic surgery is an alternative to open surgery. Laparoscopic surgery has a shorter recovery time. Your common bile duct may also need to be examined during the procedure. If stones are found in the common bile duct, they may be removed. °LET YOUR HEALTH CARE PROVIDER KNOW ABOUT: °· Any allergies you have. °· All medicines you are taking, including vitamins, herbs, eye drops, creams, and over-the-counter medicines. °· Previous problems you or members of your family have had with the use of anesthetics. °· Any blood disorders you have. °· Previous surgeries you have had. °· Medical conditions you have. °RISKS AND COMPLICATIONS °Generally, this is a safe procedure. However, as with any procedure, complications can occur. Possible complications include: °· Infection. °· Damage to the common bile duct, nerves, arteries, veins, or other internal organs such as the stomach, liver, or intestines. °· Bleeding. °· A stone may remain in the common bile duct. °· A bile leak from the cyst duct that is clipped when your gallbladder is removed. °· The need to convert to open surgery, which requires a larger incision in the abdomen. This may be necessary if your surgeon thinks it is not safe to continue with a laparoscopic procedure. °BEFORE THE  PROCEDURE °· Ask your health care provider about changing or stopping any regular medicines. You will need to stop taking aspirin or blood thinners at least 5 days prior to surgery. °· Do not eat or drink anything after midnight the night before surgery. °· Let your health care provider know if you develop a cold or other infectious problem before surgery. °PROCEDURE  °· You will be given medicine to make you sleep through the procedure (general anesthetic). A breathing tube will be placed in your mouth. °· When you are asleep, your surgeon will make several small cuts (incisions) in your abdomen. °· A thin, lighted tube with a tiny camera on the end (laparoscope) is inserted through one of the small incisions. The camera on the laparoscope sends a picture to a TV screen in the operating room. This gives the surgeon a good view inside your abdomen. °· A gas will be pumped into your abdomen. This expands your abdomen so that the surgeon has more room to perform the surgery. °· Other tools needed for the procedure are inserted through the other incisions. The gallbladder is removed through one of the incisions. °· After the removal of your gallbladder, the incisions will be closed with stitches, staples, or skin glue. °AFTER THE PROCEDURE °· You will be taken to a recovery area where your progress will be checked often. °· You may be allowed to go home the same day if your pain is controlled and you can tolerate liquids. °Document Released: 01/31/2005 Document Revised: 11/21/2012 Document Reviewed: 09/12/2012 °Exit[employer]® Patient Information ©2014 [employer]. ° °

## 2013-07-23 ENCOUNTER — Other Ambulatory Visit: Payer: Self-pay | Admitting: Internal Medicine

## 2013-11-04 ENCOUNTER — Ambulatory Visit (INDEPENDENT_AMBULATORY_CARE_PROVIDER_SITE_OTHER): Payer: 59 | Admitting: Internal Medicine

## 2013-11-04 ENCOUNTER — Encounter: Payer: Self-pay | Admitting: Internal Medicine

## 2013-11-04 VITALS — BP 122/66 | HR 69 | Temp 98.4°F | Wt 170.5 lb

## 2013-11-04 DIAGNOSIS — R0602 Shortness of breath: Secondary | ICD-10-CM

## 2013-11-04 MED ORDER — ALBUTEROL SULFATE HFA 108 (90 BASE) MCG/ACT IN AERS: 2.0000 | INHALATION_SPRAY | Freq: Four times a day (QID) | RESPIRATORY_TRACT | Status: DC | PRN

## 2013-11-04 NOTE — Progress Notes (Signed)
Subjective:    Patient ID: John [name], male    DOB: 02/09/1971, 43 y.o.   MRN: [id number]  HPI Patient presents with Shorthness of breath and chest tightness for 3 days. He reports that nothing makes the SOB worse, but using supplemental O2 makes it better.  He reports the chest tightness started last night and he states that his chest "feels full" He uses a CPAP machine with O@ supplemented. He reports he did cough up one episode of yellow sputum. His daughter has had a cold/sinus issues.   Review of Systems  Past Medical History  Diagnosis Date  . Barrett esophagus   . Heart murmur     at birth  . Hypertension   . Headache(784.0)     chronic  . Sleep apnea     uses cpap  . Pneumonia 08/2012  . GERD (gastroesophageal reflux disease)   . H/O hiatal hernia   . Migraine   . BPH (benign prostatic hyperplasia)     John [name]  . Fatty liver   . History of kidney stones     Current Outpatient Prescriptions  Medication Sig Dispense Refill  . baclofen (LIORESAL) 10 MG tablet Take 10-20 mg by mouth 2 (two) times daily as needed (for muscle spasms).       . Diclofenac Potassium (CAMBIA) 50 MG PACK Take 50 mg by mouth 2 (two) times daily as needed. Migraines      . esomeprazole (NEXIUM) 40 MG capsule Take 40 mg by mouth daily before breakfast.      . Ferrous Gluconate 325 (36 FE) MG TABS Take 1 capsule by mouth daily.      . furosemide (LASIX) 20 MG tablet Take 20 mg by mouth.      . gabapentin (NEURONTIN) 300 MG capsule Take 300 mg by mouth at bedtime.      Marland Kitchen ibuprofen (ADVIL,MOTRIN) 200 MG tablet Take 200 mg by mouth every 6 (six) hours as needed for moderate pain.      Marland Kitchen lamoTRIgine (LAMICTAL) 200 MG tablet Take 200 mg by mouth every evening.       Marland Kitchen lisinopril (PRINIVIL,ZESTRIL) 10 MG tablet Take 1 tablet (10 mg total) by mouth every morning.  30 tablet  6  . vitamin B-12 (CYANOCOBALAMIN) 1000 MCG tablet Take 1,000 mcg by mouth daily.      Marland Kitchen albuterol (PROVENTIL  HFA;VENTOLIN HFA) 108 (90 BASE) MCG/ACT inhaler Inhale 2 puffs into the lungs every 6 (six) hours as needed for wheezing or shortness of breath.  1 Inhaler  0   No current facility-administered medications for this visit.    Allergies  Allergen Reactions  . Doxycycline Other (See Comments)    Burning in throat  . Nitroglycerin Other (See Comments)    Severe headache    Family History  Problem Relation Age of Onset  . Cancer Father     skin  . Diabetes Father   . Cancer Paternal Aunt     breast  . Cancer Mother     pancreatic 2010  . Hypertension Mother     History   Social History  . Marital Status: Married    Spouse Name: N/A    Number of Children: 1  . Years of Education: 14   Occupational History  . Disabled     Chronic Headaches   Social History Main Topics  . Smoking status: Never Smoker   . Smokeless tobacco: Never Used  . Alcohol Use: No  .  Drug Use: No  . Sexual Activity: Not on file   Other Topics Concern  . Not on file   Social History Narrative   John "Ed" grew up in [place]. He lives at home with his wife and daughter. He used to work in [employer]. Now he is disabled 2/2 severe, chronic headaches. He enjoys playing video games occasional with his daughter. Enjoys watching movies.      No caffeine   No smoking   Does not exercise     Constitutional: Denies fever, malaise, fatigue, headache or abrupt weight changes.  HEENT: Denies eye pain, eye redness, ear pain, ringing in the ears, wax buildup, runny nose, nasal congestion, bloody nose, or sore throat. Cardiovascular: Denies chest pain,palpitations or swelling in the hands or feet.   No other specific complaints in a complete review of systems (except as listed in HPI above).     Objective:   Physical Exam  BP 122/66  Pulse 69  Temp(Src) 98.4 F (36.9 C) (Oral)  Wt 170 lb 8 oz (77.338 kg)  SpO2 98% Wt Readings from Last 3 Encounters:  11/04/13 170 lb 8 oz (77.338 kg)    06/06/13 175 lb (79.379 kg)  05/22/13 178 lb (80.74 kg)    General: Appears their stated age, well developed, well nourished in NAD, flat affect Cardiovascular: Normal rate and rhythm. S1,S2 noted.  No murmur, rubs or gallops noted. No JVD or BLE edema. No carotid bruits noted. Pulmonary/Chest: Normal effort and positive vesicular breath sounds. No respiratory distress. No wheezes, rales or ronchi noted.   BMET    Component Value Date/Time   NA 138 05/22/2013 1416   K 3.8 05/22/2013 1416   CL 101 05/22/2013 1416   CO2 31 05/22/2013 1416   GLUCOSE 100* 05/22/2013 1416   BUN 12 05/22/2013 1416   CREATININE 1.1 05/22/2013 1416   CREATININE 1.34 09/13/2012 1036   CALCIUM 9.6 05/22/2013 1416   GFRNONAA 80* 04/07/2013 0446   GFRAA >90 04/07/2013 0446    Lipid Panel  No results found for this basename: chol, trig, hdl, cholhdl, vldl, ldlcalc    CBC    Component Value Date/Time   WBC 5.6 04/07/2013 0446   RBC 3.94* 04/07/2013 0446   HGB 11.8* 04/07/2013 0446   HCT 35.1* 04/07/2013 0446   PLT 211 04/07/2013 0446   MCV 89.1 04/07/2013 0446   MCH 29.9 04/07/2013 0446   MCHC 33.6 04/07/2013 0446   RDW 12.4 04/07/2013 0446   LYMPHSABS 2.6 09/01/2012 0515   MONOABS 0.5 09/01/2012 0515   EOSABS 0.2 09/01/2012 0515   BASOSABS 0.0 09/01/2012 0515    Hgb A1C No results found for this basename: HGBA1C         Assessment & Plan:  1. Shortness of breath  - albuterol (PROVENTIL HFA;VENTOLIN HFA) 108 (90 BASE) MCG/ACT inhaler; Inhale 2 puffs into the lungs every 6 (six) hours as needed for wheezing or shortness of breath.  Dispense: 1 Inhaler; Refill: 0  If continues, please follow up with your pulmonologist.

## 2013-11-04 NOTE — Progress Notes (Signed)
Subjective:    Patient ID: John [name], male    DOB: 1970/10/23, 43 y.o.   MRN: [id number]  HPI  Pt presents to the clinic today with c/o shortness of breath and chest tightness. He reports this started 3 days ago. The cough is productive of yellow mucous. He denies fever, chills or body aches. He has not taken anything OTC. He has felt like this before back in July when he was diagnosed with pneumonia. He does have a history of OSA. He is not a smoker.  Additionally, he wants me to know that he was diagnosed with gallstones recently. He is not sure if this is related. He denies nausea, vomiting or diarrhea.  Review of Systems      Past Medical History  Diagnosis Date  . Barrett esophagus   . Heart murmur     at birth  . Hypertension   . Headache(784.0)     chronic  . Sleep apnea     uses cpap  . Pneumonia 08/2012  . GERD (gastroesophageal reflux disease)   . H/O hiatal hernia   . Migraine   . BPH (benign prostatic hyperplasia)     Dr. Bernardo Heater  . Fatty liver   . History of kidney stones     Current Outpatient Prescriptions  Medication Sig Dispense Refill  . baclofen (LIORESAL) 10 MG tablet Take 10-20 mg by mouth 2 (two) times daily as needed (for muscle spasms).       . Diclofenac Potassium (CAMBIA) 50 MG PACK Take 50 mg by mouth 2 (two) times daily as needed. Migraines      . esomeprazole (NEXIUM) 40 MG capsule Take 40 mg by mouth daily before breakfast.      . Ferrous Gluconate 325 (36 FE) MG TABS Take 1 capsule by mouth daily.      . furosemide (LASIX) 20 MG tablet Take 20 mg by mouth.      . gabapentin (NEURONTIN) 300 MG capsule Take 300 mg by mouth at bedtime.      Marland Kitchen ibuprofen (ADVIL,MOTRIN) 200 MG tablet Take 200 mg by mouth every 6 (six) hours as needed for moderate pain.      Marland Kitchen lamoTRIgine (LAMICTAL) 200 MG tablet Take 200 mg by mouth every evening.       Marland Kitchen lisinopril (PRINIVIL,ZESTRIL) 10 MG tablet Take 1 tablet (10 mg total) by mouth every morning.  30  tablet  6  . vitamin B-12 (CYANOCOBALAMIN) 1000 MCG tablet Take 1,000 mcg by mouth daily.       No current facility-administered medications for this visit.    Allergies  Allergen Reactions  . Doxycycline Other (See Comments)    Burning in throat  . Nitroglycerin Other (See Comments)    Severe headache    Family History  Problem Relation Age of Onset  . Cancer Father     skin  . Diabetes Father   . Cancer Paternal Aunt     breast  . Cancer Mother     pancreatic 2010  . Hypertension Mother     History   Social History  . Marital Status: Married    Spouse Name: N/A    Number of Children: 1  . Years of Education: 14   Occupational History  . Disabled     Chronic Headaches   Social History Main Topics  . Smoking status: Never Smoker   . Smokeless tobacco: Never Used  . Alcohol Use: No  . Drug Use: No  .  Sexual Activity: Not on file   Other Topics Concern  . Not on file   Social History Narrative   Estelle "Ed" grew up in [place]. He lives at home with his wife and daughter. He used to work in [employer]. Now he is disabled 2/2 severe, chronic headaches. He enjoys playing video games occasional with his daughter. Enjoys watching movies.      No caffeine   No smoking   Does not exercise     Constitutional: Pt reports fatigue. Denies fever, malaise, headache or abrupt weight changes.  HEENT: Denies eye pain, eye redness, ear pain, ringing in the ears, wax buildup, runny nose, nasal congestion, bloody nose, or sore throat. Respiratory: Pt reports shortness of breath and cough. Denies difficulty breathing, shortness of breath, cough or sputum production.     No other specific complaints in a complete review of systems (except as listed in HPI above).  Objective:   Physical Exam   BP 122/66  Pulse 69  Temp(Src) 98.4 F (36.9 C) (Oral)  Wt 170 lb 8 oz (77.338 kg)  SpO2 98% Wt Readings from Last 3 Encounters:  11/04/13 170 lb 8 oz (77.338 kg)    06/06/13 175 lb (79.379 kg)  05/22/13 178 lb (80.74 kg)    General: Appears his stated age, well developed, well nourished in NAD. HEENT: Ears: Tm's gray and intact, normal light reflex; Nose: mucosa pink and moist, septum midline; Throat/Mouth: Teeth present, mucosa pink and moist, no exudate, lesions or ulcerations noted.  Cardiovascular: Normal rate and rhythm. S1,S2 noted.  No murmur, rubs or gallops noted.  Pulmonary/Chest: Normal effort and positive vesicular breath sounds. No respiratory distress. No wheezes, rales or ronchi noted.   BMET    Component Value Date/Time   NA 138 05/22/2013 1416   K 3.8 05/22/2013 1416   CL 101 05/22/2013 1416   CO2 31 05/22/2013 1416   GLUCOSE 100* 05/22/2013 1416   BUN 12 05/22/2013 1416   CREATININE 1.1 05/22/2013 1416   CREATININE 1.34 09/13/2012 1036   CALCIUM 9.6 05/22/2013 1416   GFRNONAA 80* 04/07/2013 0446   GFRAA >90 04/07/2013 0446    Lipid Panel  No results found for this basename: chol, trig, hdl, cholhdl, vldl, ldlcalc    CBC    Component Value Date/Time   WBC 5.6 04/07/2013 0446   RBC 3.94* 04/07/2013 0446   HGB 11.8* 04/07/2013 0446   HCT 35.1* 04/07/2013 0446   PLT 211 04/07/2013 0446   MCV 89.1 04/07/2013 0446   MCH 29.9 04/07/2013 0446   MCHC 33.6 04/07/2013 0446   RDW 12.4 04/07/2013 0446   LYMPHSABS 2.6 09/01/2012 0515   MONOABS 0.5 09/01/2012 0515   EOSABS 0.2 09/01/2012 0515   BASOSABS 0.0 09/01/2012 0515    Hgb A1C No results found for this basename: HGBA1C        Assessment & Plan:   Shortness of breath:  ? Anxiety- his affect is really flat Exam is normal Will hold off on chest xray Try albuterol inhaler to see if that makes any difference  If symptoms persist or worsen, I recommend that you follow up with your PCP

## 2013-11-04 NOTE — Patient Instructions (Signed)

## 2013-11-04 NOTE — Progress Notes (Signed)
Pre visit review using our clinic review tool, if applicable. No additional management support is needed unless otherwise documented below in the visit note. 

## 2014-01-31 ENCOUNTER — Other Ambulatory Visit: Payer: Self-pay | Admitting: *Deleted

## 2014-01-31 MED ORDER — LISINOPRIL 10 MG PO TABS: 10.0000 mg | ORAL_TABLET | Freq: Every morning | ORAL | Status: DC

## 2014-02-28 ENCOUNTER — Other Ambulatory Visit: Payer: Self-pay | Admitting: Nurse Practitioner

## 2014-03-27 ENCOUNTER — Ambulatory Visit: Payer: Self-pay | Admitting: Orthopedic Surgery

## 2014-03-27 NOTE — H&P (Signed)
John [name] DOB: 1970-10-12 Married / Language: English / Race: White Male  Chief Complaint: R knee pain  History of Present Illness The patient is a 44 year old male who presents today for follow up of their knee. The patient is being followed for their right knee pain. They are now 10 1/2 weeks out from when symptoms began. Symptoms reported today include: pain and instability, while the patient does not report symptoms of: locking or giving way. The patient feels that they are doing poorly and report their pain level to be moderate to severe. Current treatment includes: activity modification and NSAIDs (Ibuprofen). The following medication has been used for pain control: Norco. The patient has not gotten any relief of their symptoms with Cortisone injections. Note for "Follow-up Knee": John [name] follows up for his right knee. He is now about 10.5 weeks s/p onset of symptoms, reports ongoing R knee pain, worst at the medial aspect of the knee, with progressively worsening instability. He also reports continued intermittent swelling. Yesterday he was experiencing snapping and tightness in the knee medially. He has had 2 steroid injections now, with depomedrol and then celestone, both with no relief. He is taking Norco and Ibuprofen as needed. He deals with chronic migraines for 10+ years and has one today. He denies back pain or radicular pain from the back.  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) Left knee pain (M25.562) Primary osteoarthritis of both knees (M17.0) Liver disease Migraine Headache Chronic Pain Gastroesophageal Reflux Disease Irritable bowel syndrome Kidney Stone Heart murmur High blood pressure Sleep Apnea   Allergies No Known Drug Allergies11/18/2013  Family History First Degree Relatives Cancer mother and father mother Hypertension mother  Social History   Tobacco use never smoker Drug/Alcohol Rehab (Previously) no Exercise Exercises rarely; does other Exercises weekly; does running / walking Illicit drug use no Living situation live with spouse Children 1 Number of flights of stairs before winded 4-5 greater than 5 Pain Contract no Drug/Alcohol Rehab (Currently) no Alcohol use current drinker; only occasionally per week Marital status married Current work status disabled  Medication History Gabapentin (300MG  Capsule, Oral) Active. Candesartan Cilexetil (16MG  Tablet, Oral) Active. Baclofen (Oral) Specific dose unknown - Active. Lisinopril (10MG  Tablet, Oral) Active. NexIUM (40MG  Capsule DR, Oral) Active. Cambia (50MG  Packet, Oral) Active. LamoTRIgine (100MG  Tablet, Oral) Active. Norco (7.5-325MG  Tablet, 1 (one) Tablet Oral every 6-8 hours as needed for pain, Taken starting 03/03/2014) Inactive. Medications Reconciled  Past Surgical History  Straighten Nasal Septum Appendectomy Sinus Surgery Other Orthopaedic Surgery  Review of Systems General Not Present- Fever and Weight Loss. Skin Not Present- Rash. Cardiovascular Not Present- Chest Pain and Shortness of Breath. Male Genitourinary Not Present- Painful Urination. Musculoskeletal Present- Joint Pain, Joint Stiffness and Joint Swelling. Neurological Not Present- Difficulty with balance, Loss of bladder control, Loss of bowel control, Numbness and Weakness. Endocrine Not Present- Excessive Thirst and Excessive Urination. Hematology Not Present- Easy Bruising.  Physical Exam General Mental Status -Alert. General Appearance-pleasant, Not in acute distress. Orientation-Oriented X3. Build & Nutrition-Well nourished and Well developed. Gait-Unassisted and unimpaired.  Musculoskeletal Lower Extremity  Right Lower Extremity: Right Knee: Inspection and Palpation - Tenderness - medial undersurface of the patella tender to palpation and  medial joint line tender to palpation, no tenderness to palpation of the pes anserine bursa, no tenderness to palpation of the quadriceps tendon, no tenderness to palpation of the patellar tendon, no tenderness to palpation of the  patella, no tenderness to palpation of the lateral joint line, no tenderness to palpation of the fibular head, no tenderness to palpation of the peroneal nerve. Patellar Tendon - no pain to palpation of the patellar tendon. Swelling - none. Effusion - none. Tissue tension/texture is - soft. Crepitus - mild patellofemoral crepitus. Pulses - 2+. Sensation - intact to light touch. Skin - Color - no ecchymosis, no erythema. Strength and Tone - Quadriceps - 5/5. Hamstrings - 5/5. ROM: Flexion - AROM - 120 . Note: causes tightness. Extension - AROM - 0 . Note: causes tightness. Stability - Valgus Laxity at 30 - None. Valgus Laxity at 0 - None. Varus Laxity at 30 - None. Varus Laxity at 0 - None. Lachman - Negative. Anterior Drawer Test - Negative. Posterior Drawer Test - Negative. Right Knee - Deformities/Malalignments/Discrepancies - no deformities noted. Special Tests - McMurray Test (lateral) - negative. McMurray Test (medial) - painful. Patellar Compression Pain - mild pain.  Imaging Prior standing xrays reviewed, mild medial and PF joint space narrowing, no collapse.  MRI R knee images and report reviewed, small effusion, no meniscal or ligament tear.  Assessment & Plan  Primary osteoarthritis of right knee (M17.11) Right medial knee pain (M25.561)  Pt with ongoing R knee pain, instability, swelling x 10.5 weeks refractory to 2 intra-articular steroid injections, NSAIDs, narcotics, activity modifications, quad strengthening, relative rest. Discussed relevant anatomy, possibiliy of occult meniscal tear, chondral flap tear, painful plica, or even synovitis causing his continued and progressively worsening symptoms. Discussed tx options. He has already had 2 steroid  injections without relief therefore would not repeat those. We could proceed with arthroscopy. He does not feel he can continue to deal with his symptoms the way they are currently. At this point, recommend proceeding with R knee arthroscopy, debridement. Discussed the procedure itself as well as risks, complications and alternatives, including but not limited to DVT, PE, infx, bleeding, failure of procedure, need for secondary procedure, ongoing pain/symptoms, anesthesia risk, even stroke or death. Also discussed typical post-op protocols, time out of work, ice and elevation, HEP/quad strengthening, possible formal PT. Discussed need for DVT ppx post-op with ASA per protocol. We discussed the possibility of worsening pain and stiffness post-operatively due to DJD and the possible need for future steroid injections, viscosupplementation, repeat arthroscopy, even TKA. All questions were answered. Patient desires to proceed with surgery. He will not require surgical clearance. We will proceed with scheduling accordingly. In the interim, continue ice, elevation, activity modifications, quad strengthening, NSAIDs, pain meds prn. He will call for refills by request. He will follow up 10-14 days post-op for suture removal/recheck and will call with any questions or concerns in the interim.  Plan R knee arthroscopy, debridement  Signed electronically by Lacie Draft PA-C for Dr. Tonita Cong

## 2014-04-05 ENCOUNTER — Other Ambulatory Visit: Payer: Self-pay | Admitting: Internal Medicine

## 2014-04-08 ENCOUNTER — Ambulatory Visit: Payer: Self-pay | Admitting: Orthopedic Surgery

## 2014-04-11 ENCOUNTER — Ambulatory Visit: Payer: Self-pay | Admitting: Orthopedic Surgery

## 2014-04-15 ENCOUNTER — Encounter (HOSPITAL_BASED_OUTPATIENT_CLINIC_OR_DEPARTMENT_OTHER): Payer: Self-pay | Admitting: *Deleted

## 2014-04-16 ENCOUNTER — Encounter (HOSPITAL_BASED_OUTPATIENT_CLINIC_OR_DEPARTMENT_OTHER): Payer: Self-pay | Admitting: *Deleted

## 2014-04-16 NOTE — Progress Notes (Signed)
NPO AFTER MN WITH EXCEPTION CLEAR LIQUIDS UNTIL 0700 (NO CREAM/ MILK PRODUCTS).  ARRIVE AT 1100. NEEDS ISTAT AND EKG. WILL TAKE ATACAND AND NEXIUM AM DOS W/ SIPS OF WATER.

## 2014-04-18 ENCOUNTER — Ambulatory Visit (HOSPITAL_BASED_OUTPATIENT_CLINIC_OR_DEPARTMENT_OTHER): Payer: 59 | Admitting: Anesthesiology

## 2014-04-18 ENCOUNTER — Encounter (HOSPITAL_BASED_OUTPATIENT_CLINIC_OR_DEPARTMENT_OTHER): Payer: Self-pay | Admitting: *Deleted

## 2014-04-18 ENCOUNTER — Other Ambulatory Visit: Payer: Self-pay

## 2014-04-18 ENCOUNTER — Encounter (HOSPITAL_BASED_OUTPATIENT_CLINIC_OR_DEPARTMENT_OTHER)
Admission: RE | Disposition: A | Discharge status: Home / Self Care | Payer: Self-pay | Source: Ambulatory Visit | Attending: Specialist

## 2014-04-18 ENCOUNTER — Ambulatory Visit (HOSPITAL_BASED_OUTPATIENT_CLINIC_OR_DEPARTMENT_OTHER)
Admission: RE | Admit: 2014-04-18 | Discharge: 2014-04-18 | Disposition: A | Discharge status: Home / Self Care | Payer: 59 | Source: Ambulatory Visit | Attending: Specialist | Admitting: Specialist

## 2014-04-18 DIAGNOSIS — G43909 Migraine, unspecified, not intractable, without status migrainosus: Secondary | ICD-10-CM | POA: Diagnosis not present

## 2014-04-18 DIAGNOSIS — M1711 Unilateral primary osteoarthritis, right knee: Secondary | ICD-10-CM

## 2014-04-18 DIAGNOSIS — F329 Major depressive disorder, single episode, unspecified: Secondary | ICD-10-CM | POA: Diagnosis not present

## 2014-04-18 DIAGNOSIS — G4730 Sleep apnea, unspecified: Secondary | ICD-10-CM | POA: Diagnosis not present

## 2014-04-18 DIAGNOSIS — M6751 Plica syndrome, right knee: Secondary | ICD-10-CM | POA: Diagnosis present

## 2014-04-18 DIAGNOSIS — I10 Essential (primary) hypertension: Secondary | ICD-10-CM | POA: Insufficient documentation

## 2014-04-18 DIAGNOSIS — K219 Gastro-esophageal reflux disease without esophagitis: Secondary | ICD-10-CM | POA: Diagnosis not present

## 2014-04-18 DIAGNOSIS — K449 Diaphragmatic hernia without obstruction or gangrene: Secondary | ICD-10-CM | POA: Diagnosis not present

## 2014-04-18 DIAGNOSIS — M170 Bilateral primary osteoarthritis of knee: Secondary | ICD-10-CM | POA: Insufficient documentation

## 2014-04-18 HISTORY — PX: KNEE ARTHROSCOPY: SHX127

## 2014-04-18 HISTORY — DX: Personal history of other diseases of the digestive system: Z87.19

## 2014-04-18 HISTORY — DX: Personal history of other specified conditions: Z87.898

## 2014-04-18 HISTORY — DX: Major depressive disorder, single episode, unspecified: F32.9

## 2014-04-18 HISTORY — DX: Benign prostatic hyperplasia without lower urinary tract symptoms: N40.0

## 2014-04-18 HISTORY — DX: Depression, unspecified: F32.A

## 2014-04-18 HISTORY — DX: Obstructive sleep apnea (adult) (pediatric): G47.33

## 2014-04-18 HISTORY — DX: Dependence on other enabling machines and devices: Z99.89

## 2014-04-18 LAB — POCT I-STAT 4, (NA,K, GLUC, HGB,HCT)
GLUCOSE: 86 mg/dL (ref 70–99)
HCT: 40 % (ref 39.0–52.0)
Hemoglobin: 13.6 g/dL (ref 13.0–17.0)
POTASSIUM: 3.9 mmol/L (ref 3.5–5.1)
Sodium: 141 mmol/L (ref 135–145)

## 2014-04-18 SURGERY — ARTHROSCOPY, KNEE
Anesthesia: General | Site: Knee | Laterality: Right

## 2014-04-18 MED ORDER — FENTANYL CITRATE 0.05 MG/ML IJ SOLN
INTRAMUSCULAR | Status: AC
  Filled 2014-04-18: qty 4

## 2014-04-18 MED ORDER — MIDAZOLAM HCL 5 MG/5ML IJ SOLN
INTRAMUSCULAR | Status: DC | PRN
  Administered 2014-04-18: 2 mg via INTRAVENOUS

## 2014-04-18 MED ORDER — EPINEPHRINE HCL 1 MG/ML IJ SOLN
INTRAMUSCULAR | Status: DC | PRN
  Administered 2014-04-18: 15:00:00

## 2014-04-18 MED ORDER — ONDANSETRON HCL 4 MG/2ML IJ SOLN
INTRAMUSCULAR | Status: DC | PRN
  Administered 2014-04-18: 4 mg via INTRAVENOUS

## 2014-04-18 MED ORDER — CEFAZOLIN SODIUM-DEXTROSE 2-3 GM-% IV SOLR
INTRAVENOUS | Status: AC
  Filled 2014-04-18: qty 50

## 2014-04-18 MED ORDER — MIDAZOLAM HCL 2 MG/2ML IJ SOLN
INTRAMUSCULAR | Status: AC
  Filled 2014-04-18: qty 2

## 2014-04-18 MED ORDER — DEXAMETHASONE SODIUM PHOSPHATE 10 MG/ML IJ SOLN
INTRAMUSCULAR | Status: DC | PRN
  Administered 2014-04-18: 10 mg via INTRAVENOUS

## 2014-04-18 MED ORDER — LACTATED RINGERS IV SOLN
INTRAVENOUS | Status: DC
  Filled 2014-04-18: qty 1000

## 2014-04-18 MED ORDER — FENTANYL CITRATE 0.05 MG/ML IJ SOLN
25.0000 ug | INTRAMUSCULAR | Status: DC | PRN
  Filled 2014-04-18: qty 1

## 2014-04-18 MED ORDER — KETOROLAC TROMETHAMINE 30 MG/ML IJ SOLN
INTRAMUSCULAR | Status: DC | PRN
  Administered 2014-04-18: 30 mg via INTRAVENOUS

## 2014-04-18 MED ORDER — LACTATED RINGERS IV SOLN
INTRAVENOUS | Status: DC
  Administered 2014-04-18: 14:00:00 via INTRAVENOUS
  Filled 2014-04-18: qty 1000

## 2014-04-18 MED ORDER — PROPOFOL INFUSION 10 MG/ML OPTIME
INTRAVENOUS | Status: DC | PRN
  Administered 2014-04-18: 150 mL via INTRAVENOUS

## 2014-04-18 MED ORDER — FENTANYL CITRATE 0.05 MG/ML IJ SOLN
INTRAMUSCULAR | Status: DC | PRN
  Administered 2014-04-18: 50 ug via INTRAVENOUS

## 2014-04-18 MED ORDER — ACETAMINOPHEN 10 MG/ML IV SOLN
INTRAVENOUS | Status: DC | PRN
  Administered 2014-04-18: 1000 mg via INTRAVENOUS

## 2014-04-18 MED ORDER — LACTATED RINGERS IV SOLN
INTRAVENOUS | Status: DC | PRN
  Administered 2014-04-18 (×2): via INTRAVENOUS

## 2014-04-18 MED ORDER — OXYCODONE-ACETAMINOPHEN 5-325 MG PO TABS: 1.0000 | ORAL_TABLET | ORAL | Status: DC | PRN

## 2014-04-18 MED ORDER — BUPIVACAINE-EPINEPHRINE 0.5% -1:200000 IJ SOLN
INTRAMUSCULAR | Status: DC | PRN
  Administered 2014-04-18: 25 mL

## 2014-04-18 MED ORDER — CEFAZOLIN SODIUM-DEXTROSE 2-3 GM-% IV SOLR
2.0000 g | INTRAVENOUS | Status: AC
  Administered 2014-04-18: 2 g via INTRAVENOUS
  Filled 2014-04-18: qty 50

## 2014-04-18 MED ORDER — LIDOCAINE HCL (CARDIAC) 20 MG/ML IV SOLN
INTRAVENOUS | Status: DC | PRN
  Administered 2014-04-18: 80 mg via INTRAVENOUS

## 2014-04-18 SURGICAL SUPPLY — 50 items
BANDAGE ELASTIC 6 VELCRO ST LF (GAUZE/BANDAGES/DRESSINGS) ×2 IMPLANT
BLADE 4.2CUDA (BLADE) IMPLANT
BLADE CUDA SHAVER 3.5 (BLADE) ×2 IMPLANT
BLADE GREAT WHITE 4.2 (BLADE) IMPLANT
BNDG COHESIVE 6X5 TAN NS LF (GAUZE/BANDAGES/DRESSINGS) ×2 IMPLANT
BOOTIES KNEE HIGH SLOAN (MISCELLANEOUS) ×2 IMPLANT
CANISTER SUCT LVC 12 LTR MEDI- (MISCELLANEOUS) ×2 IMPLANT
CANISTER SUCTION 1200CC (MISCELLANEOUS) ×2 IMPLANT
CANISTER SUCTION 2500CC (MISCELLANEOUS) IMPLANT
CANNULA ACUFLEX KIT 5X76 (CANNULA) IMPLANT
CLOTH BEACON ORANGE TIMEOUT ST (SAFETY) ×2 IMPLANT
CUTTER MENISCUS  4.2MM (BLADE)
CUTTER MENISCUS 4.2MM (BLADE) IMPLANT
DRAPE ARTHROSCOPY W/POUCH 114 (DRAPES) ×2 IMPLANT
DRSG EMULSION OIL 3X3 NADH (GAUZE/BANDAGES/DRESSINGS) ×2 IMPLANT
DRSG PAD ABDOMINAL 8X10 ST (GAUZE/BANDAGES/DRESSINGS) ×2 IMPLANT
DURAPREP 26ML APPLICATOR (WOUND CARE) ×2 IMPLANT
ELECT REM PT RETURN 9FT ADLT (ELECTROSURGICAL)
ELECTRODE REM PT RTRN 9FT ADLT (ELECTROSURGICAL) IMPLANT
GLOVE BIOGEL M 6.5 STRL (GLOVE) ×2 IMPLANT
GLOVE BIOGEL PI IND STRL 6.5 (GLOVE) ×1 IMPLANT
GLOVE BIOGEL PI IND STRL 7.5 (GLOVE) ×1 IMPLANT
GLOVE BIOGEL PI INDICATOR 6.5 (GLOVE) ×1
GLOVE BIOGEL PI INDICATOR 7.5 (GLOVE) ×1
GLOVE SURG SS PI 8.0 STRL IVOR (GLOVE) ×2 IMPLANT
GOWN PREVENTION PLUS LG XLONG (DISPOSABLE) IMPLANT
GOWN STRL REIN XL XLG (GOWN DISPOSABLE) IMPLANT
GOWN STRL REUS W/TWL LRG LVL3 (GOWN DISPOSABLE) ×2 IMPLANT
GOWN STRL REUS W/TWL XL LVL3 (GOWN DISPOSABLE) ×2 IMPLANT
IV NS IRRIG 3000ML ARTHROMATIC (IV SOLUTION) ×4 IMPLANT
KNEE WRAP E Z 3 GEL PACK (MISCELLANEOUS) ×2 IMPLANT
MINI VAC (SURGICAL WAND) IMPLANT
NDL SAFETY ECLIPSE 18X1.5 (NEEDLE) ×1 IMPLANT
NEEDLE FILTER BLUNT 18X 1/2SAF (NEEDLE) ×1
NEEDLE FILTER BLUNT 18X1 1/2 (NEEDLE) ×1 IMPLANT
NEEDLE HYPO 18GX1.5 SHARP (NEEDLE) ×1
PACK ARTHROSCOPY DSU (CUSTOM PROCEDURE TRAY) ×2 IMPLANT
PACK BASIN DAY SURGERY FS (CUSTOM PROCEDURE TRAY) ×2 IMPLANT
PADDING CAST COTTON 6X4 STRL (CAST SUPPLIES) ×2 IMPLANT
RESECTOR FULL RADIUS 4.2MM (BLADE) IMPLANT
SET ARTHROSCOPY TUBING (MISCELLANEOUS) ×1
SET ARTHROSCOPY TUBING LN (MISCELLANEOUS) ×1 IMPLANT
SPONGE GAUZE 4X4 12PLY (GAUZE/BANDAGES/DRESSINGS) ×2 IMPLANT
SPONGE GAUZE 4X4 12PLY STER LF (GAUZE/BANDAGES/DRESSINGS) ×2 IMPLANT
SUT ETHILON 4 0 PS 2 18 (SUTURE) ×2 IMPLANT
SYR 30ML LL (SYRINGE) ×2 IMPLANT
SYRINGE 10CC LL (SYRINGE) ×2 IMPLANT
TOWEL OR 17X24 6PK STRL BLUE (TOWEL DISPOSABLE) ×2 IMPLANT
WAND 90 DEG TURBOVAC W/CORD (SURGICAL WAND) IMPLANT
WATER STERILE IRR 500ML POUR (IV SOLUTION) ×2 IMPLANT

## 2014-04-18 NOTE — Brief Op Note (Signed)
04/18/2014  3:20 PM  PATIENT:  Cloretta Ned Nagengast III  44 y.o. male  PRE-OPERATIVE DIAGNOSIS:  RIGHT KNEE PAIN  POST-OPERATIVE DIAGNOSIS:  RIGHT KNEE PAIN  PROCEDURE:  Procedure(s): RIGHT ARTHROSCOPY KNEE / PLICA EXCISION (Right)  SURGEON:  Surgeon(s) and Role:    * Johnn Hai, MD - Primary  PHYSICIAN ASSISTANT:   ASSISTANTS: none   ANESTHESIA:   general  EBL:     BLOOD ADMINISTERED:none  DRAINS: none   LOCAL MEDICATIONS USED:  MARCAINE     SPECIMEN:  No Specimen  DISPOSITION OF SPECIMEN:  N/A  COUNTS:  YES  TOURNIQUET:  * No tourniquets in log *  DICTATION: .Other Dictation: Dictation Number [id number]  PLAN OF CARE: Discharge to home after PACU  PATIENT DISPOSITION:  PACU - hemodynamically stable.   Delay start of Pharmacological VTE agent (>24hrs) due to surgical blood loss or risk of bleeding: no

## 2014-04-18 NOTE — Discharge Instructions (Signed)
ARTHROSCOPIC KNEE SURGERY HOME CARE INSTRUCTIONS   PAIN You will be expected to have a moderate amount of pain in the affected knee for approximately two weeks.  However, the first two to four days will be the most severe in terms of the pain you will experience.  Prescriptions have been provided for you to take as needed for the pain.  The pain can be markedly reduced by using the ice/compressive bandage given.  Exchange the ice packs whenever they thaw.  During the night, keep the bandage on because it will still provide some compression for the swelling.  Also, keep the leg elevated on pillows above your heart, and this will help alleviate the pain and swelling.  MEDICATION Prescriptions have been provided to take as needed for pain. To prevent blood clots, take Aspirin 325mg  daily with a meal if not on a blood thinner and if no history of stomach ulcers.  ACTIVITY It is preferred that you stay on bedrest for approximately 24 hours.  However, you may go to the bathroom with help.  After this, you can start to be up and about progressively more.  Remember that the swelling may still increase after three to four days if you are up and doing too much.  You may put as much weight on the affected leg as pain will allow.  Use your crutches for comfort and safety.  However, as soon as you are able, you may discard the crutches and go without them.   DRESSING Keep the current dressing as dry as possible.  Two days after your surgery, you may remove the ice/compressive wrap, and surgical dressing.  You may now take a shower, but do not scrub the sounds directly with soap.  Let water rinse over these and gently wipe with your hand.  Reapply band-aids over the puncture wounds and more gauze if needed.  A slight amount of thin drainage can be normal at this time, and do not let it frighten you.  Reapply the ice/compressive wrap.  You may now repeat this every day each time you shower.  SYMPTOMS TO REPORT TO  YOUR DOCTOR  -Extreme pain.  -Extreme swelling.  -Temperature above 101 degrees that does not come down with acetaminophen     (Tylenol).  -Any changes in the feeling, color or movement of your toes.  -Extreme redness, heat, swelling or drainage at your incision  EXERCISE It is preferred that you begin to exercise on the day of your surgery.  Straight leg raises and short arc quads should be begun the afternoon or evening of surgery and continued until you come back for your follow-up appointment.   Attached is an instruction sheet on how to perform these two simple exercises.  Do these at least three times per day if not more.  You may bend your knee as much as is comfortable.  The puncture wounds may occasionally be slightly uncomfortable with bending of the knee.  Do not let this frighten you.  It is important to keep your knee motion, but do not overdo it.  If you have significant pain, simply do not bend the knee as far.   You will be given more exercises to perform at your first return visit.    RETURN APPOINTMENT Please make an appointment to be seen by your doctor in 14 days from your surgery.        Post Anesthesia Home Care Instructions  Activity: Get plenty of rest for the remainder of  the day. A responsible adult should stay with you for 24 hours following the procedure.  For the next 24 hours, DO NOT: -Drive a car -Paediatric nurse -Drink alcoholic beverages -Take any medication unless instructed by your physician -Make any legal decisions or sign important papers.  Meals: Start with liquid foods such as gelatin or soup. Progress to regular foods as tolerated. Avoid greasy, spicy, heavy foods. If nausea and/or vomiting occur, drink only clear liquids until the nausea and/or vomiting subsides. Call your physician if vomiting continues.  Special Instructions/Symptoms: Your throat may feel dry or sore from the anesthesia or the breathing tube placed in your throat during  surgery. If this causes discomfort, gargle with warm salt water. The discomfort should disappear within 24 hours.

## 2014-04-18 NOTE — H&P (View-Only) (Signed)
John [name] DOB: 1970/08/30 Married / Language: English / Race: White Male  Chief Complaint: R knee pain  History of Present Illness The patient is a 44 year old male who presents today for follow up of their knee. The patient is being followed for their right knee pain. They are now 10 1/2 weeks out from when symptoms began. Symptoms reported today include: pain and instability, while the patient does not report symptoms of: locking or giving way. The patient feels that they are doing poorly and report their pain level to be moderate to severe. Current treatment includes: activity modification and NSAIDs (Ibuprofen). The following medication has been used for pain control: Norco. The patient has not gotten any relief of their symptoms with Cortisone injections. Note for "Follow-up Knee": John [name] follows up for his right knee. He is now about 10.5 weeks s/p onset of symptoms, reports ongoing R knee pain, worst at the medial aspect of the knee, with progressively worsening instability. He also reports continued intermittent swelling. Yesterday he was experiencing snapping and tightness in the knee medially. He has had 2 steroid injections now, with depomedrol and then celestone, both with no relief. He is taking Norco and Ibuprofen as needed. He deals with chronic migraines for 10+ years and has one today. He denies back pain or radicular pain from the back.  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) Left knee pain (M25.562) Primary osteoarthritis of both knees (M17.0) Liver disease Migraine Headache Chronic Pain Gastroesophageal Reflux Disease Irritable bowel syndrome Kidney Stone Heart murmur High blood pressure Sleep Apnea   Allergies No Known Drug Allergies11/18/2013  Family History First Degree Relatives Cancer mother and father mother Hypertension mother  Social History   Tobacco use never smoker Drug/Alcohol Rehab (Previously) no Exercise Exercises rarely; does other Exercises weekly; does running / walking Illicit drug use no Living situation live with spouse Children 1 Number of flights of stairs before winded 4-5 greater than 5 Pain Contract no Drug/Alcohol Rehab (Currently) no Alcohol use current drinker; only occasionally per week Marital status married Current work status disabled  Medication History Gabapentin (300MG  Capsule, Oral) Active. Candesartan Cilexetil (16MG  Tablet, Oral) Active. Baclofen (Oral) Specific dose unknown - Active. Lisinopril (10MG  Tablet, Oral) Active. NexIUM (40MG  Capsule DR, Oral) Active. Cambia (50MG  Packet, Oral) Active. LamoTRIgine (100MG  Tablet, Oral) Active. Norco (7.5-325MG  Tablet, 1 (one) Tablet Oral every 6-8 hours as needed for pain, Taken starting 03/03/2014) Inactive. Medications Reconciled  Past Surgical History  Straighten Nasal Septum Appendectomy Sinus Surgery Other Orthopaedic Surgery  Review of Systems General Not Present- Fever and Weight Loss. Skin Not Present- Rash. Cardiovascular Not Present- Chest Pain and Shortness of Breath. Male Genitourinary Not Present- Painful Urination. Musculoskeletal Present- Joint Pain, Joint Stiffness and Joint Swelling. Neurological Not Present- Difficulty with balance, Loss of bladder control, Loss of bowel control, Numbness and Weakness. Endocrine Not Present- Excessive Thirst and Excessive Urination. Hematology Not Present- Easy Bruising.  Physical Exam General Mental Status -Alert. General Appearance-pleasant, Not in acute distress. Orientation-Oriented X3. Build & Nutrition-Well nourished and Well developed. Gait-Unassisted and unimpaired.  Musculoskeletal Lower Extremity  Right Lower Extremity: Right Knee: Inspection and Palpation - Tenderness - medial undersurface of the patella tender to palpation and  medial joint line tender to palpation, no tenderness to palpation of the pes anserine bursa, no tenderness to palpation of the quadriceps tendon, no tenderness to palpation of the patellar tendon, no tenderness to palpation of the  patella, no tenderness to palpation of the lateral joint line, no tenderness to palpation of the fibular head, no tenderness to palpation of the peroneal nerve. Patellar Tendon - no pain to palpation of the patellar tendon. Swelling - none. Effusion - none. Tissue tension/texture is - soft. Crepitus - mild patellofemoral crepitus. Pulses - 2+. Sensation - intact to light touch. Skin - Color - no ecchymosis, no erythema. Strength and Tone - Quadriceps - 5/5. Hamstrings - 5/5. ROM: Flexion - AROM - 120 . Note: causes tightness. Extension - AROM - 0 . Note: causes tightness. Stability - Valgus Laxity at 30 - None. Valgus Laxity at 0 - None. Varus Laxity at 30 - None. Varus Laxity at 0 - None. Lachman - Negative. Anterior Drawer Test - Negative. Posterior Drawer Test - Negative. Right Knee - Deformities/Malalignments/Discrepancies - no deformities noted. Special Tests - McMurray Test (lateral) - negative. McMurray Test (medial) - painful. Patellar Compression Pain - mild pain.  Imaging Prior standing xrays reviewed, mild medial and PF joint space narrowing, no collapse.  MRI R knee images and report reviewed, small effusion, no meniscal or ligament tear.  Assessment & Plan  Primary osteoarthritis of right knee (M17.11) Right medial knee pain (M25.561)  Pt with ongoing R knee pain, instability, swelling x 10.5 weeks refractory to 2 intra-articular steroid injections, NSAIDs, narcotics, activity modifications, quad strengthening, relative rest. Discussed relevant anatomy, possibiliy of occult meniscal tear, chondral flap tear, painful plica, or even synovitis causing his continued and progressively worsening symptoms. Discussed tx options. He has already had 2 steroid  injections without relief therefore would not repeat those. We could proceed with arthroscopy. He does not feel he can continue to deal with his symptoms the way they are currently. At this point, recommend proceeding with R knee arthroscopy, debridement. Discussed the procedure itself as well as risks, complications and alternatives, including but not limited to DVT, PE, infx, bleeding, failure of procedure, need for secondary procedure, ongoing pain/symptoms, anesthesia risk, even stroke or death. Also discussed typical post-op protocols, time out of work, ice and elevation, HEP/quad strengthening, possible formal PT. Discussed need for DVT ppx post-op with ASA per protocol. We discussed the possibility of worsening pain and stiffness post-operatively due to DJD and the possible need for future steroid injections, viscosupplementation, repeat arthroscopy, even TKA. All questions were answered. Patient desires to proceed with surgery. He will not require surgical clearance. We will proceed with scheduling accordingly. In the interim, continue ice, elevation, activity modifications, quad strengthening, NSAIDs, pain meds prn. He will call for refills by request. He will follow up 10-14 days post-op for suture removal/recheck and will call with any questions or concerns in the interim.  Plan R knee arthroscopy, debridement  Signed electronically by Lacie Draft PA-C for Dr. Tonita Cong

## 2014-04-18 NOTE — Anesthesia Procedure Notes (Signed)
Procedure Name: LMA Insertion Date/Time: 04/18/2014 2:41 PM Performed by: Wanita Chamberlain Pre-anesthesia Checklist: Patient identified, Timeout performed, Emergency Drugs available, Suction available and Patient being monitored Patient Re-evaluated:Patient Re-evaluated prior to inductionOxygen Delivery Method: Circle system utilized Preoxygenation: Pre-oxygenation with 100% oxygen Intubation Type: IV induction Ventilation: Mask ventilation without difficulty LMA: LMA inserted LMA Size: 5.0 Number of attempts: 1 Airway Equipment and Method: Bite block

## 2014-04-18 NOTE — Anesthesia Postprocedure Evaluation (Signed)
  Anesthesia Post-op Note  Patient: John [name]  Procedure(s) Performed: Procedure(s) (LRB): RIGHT ARTHROSCOPY KNEE / PLICA EXCISION (Right)  Patient Location: PACU  Anesthesia Type: General  Level of Consciousness: awake and alert   Airway and Oxygen Therapy: Patient Spontanous Breathing  Post-op Pain: mild  Post-op Assessment: Post-op Vital signs reviewed, Patient's Cardiovascular Status Stable, Respiratory Function Stable, Patent Airway and No signs of Nausea or vomiting  Last Vitals:  Filed Vitals:   04/18/14 1610  BP: 120/71  Pulse: 67  Temp:   Resp: 12    Post-op Vital Signs: stable   Complications: No apparent anesthesia complications

## 2014-04-18 NOTE — Transfer of Care (Signed)
Immediate Anesthesia Transfer of Care Note  Patient: John [name]  Procedure(s) Performed: Procedure(s): RIGHT ARTHROSCOPY KNEE / PLICA EXCISION (Right)  Patient Location: PACU  Anesthesia Type:General  Level of Consciousness: awake, alert , oriented and patient cooperative  Airway & Oxygen Therapy: Patient Spontanous Breathing and Patient connected to nasal cannula oxygen  Post-op Assessment: Report given to RN and Post -op Vital signs reviewed and stable  Post vital signs: Reviewed and stable  Last Vitals:  Filed Vitals:   04/18/14 1301  BP: 129/74  Pulse: 71  Temp: 37.2 C  Resp: 16    Complications: No apparent anesthesia complications

## 2014-04-18 NOTE — Interval H&P Note (Signed)
History and Physical Interval Note:  04/18/2014 1:09 PM  John [name]  has presented today for surgery, with the diagnosis of RIGHT KNEE PAIN  The various methods of treatment have been discussed with the patient and family. After consideration of risks, benefits and other options for treatment, the patient has consented to  Procedure(s): RIGHT ARTHROSCOPY KNEE AND DEBRIDEMENT (Right) as a surgical intervention .  The patient's history has been reviewed, patient examined, no change in status, stable for surgery.  I have reviewed the patient's chart and labs.  Questions were answered to the patient's satisfaction.     [name]

## 2014-04-18 NOTE — Anesthesia Preprocedure Evaluation (Addendum)
Anesthesia Evaluation  Patient identified by MRN, date of birth, ID band Patient awake    Reviewed: Allergy & Precautions, H&P , NPO status , Patient's Chart, lab work & pertinent test results  Airway Mallampati: III  TM Distance: >3 FB Neck ROM: Full    Dental no notable dental hx. (+) Dental Advisory Given, Teeth Intact   Pulmonary sleep apnea and Continuous Positive Airway Pressure Ventilation , pneumonia -, resolved,  breath sounds clear to auscultation  Pulmonary exam normal       Cardiovascular Exercise Tolerance: Good hypertension, Pt. on medications + Valvular Problems/Murmurs Rhythm:Regular Rate:Normal     Neuro/Psych  Headaches, Depression negative psych ROS   GI/Hepatic Neg liver ROS, hiatal hernia, GERD-  Medicated and Controlled,Barrett's esophagus. S/p Nissen   Endo/Other  negative endocrine ROS  Renal/GU      Musculoskeletal negative musculoskeletal ROS (+)   Abdominal Normal abdominal exam  (+)   Peds  Hematology negative hematology ROS (+)   Anesthesia Other Findings Pt denies any history of anesthetic awareness  Reproductive/Obstetrics                         Anesthesia Physical Anesthesia Plan  ASA: III  Anesthesia Plan: General   Post-op Pain Management:    Induction: Intravenous  Airway Management Planned: LMA  Additional Equipment:   Intra-op Plan:   Post-operative Plan:   Informed Consent:   Plan Discussed with: Surgeon  Anesthesia Plan Comments:         Anesthesia Quick Evaluation

## 2014-04-21 ENCOUNTER — Encounter (HOSPITAL_BASED_OUTPATIENT_CLINIC_OR_DEPARTMENT_OTHER): Payer: Self-pay | Admitting: Specialist

## 2014-04-21 NOTE — Op Note (Signed)
NAME:  John [name], SORTER NO.:  [id number]  MEDICAL RECORD NO.:  [id number]  LOCATION:                                 FACILITY:  PHYSICIAN:  Susa Day, M.D.    DATE OF BIRTH:  1971-01-07  DATE OF PROCEDURE:  04/18/2014 DATE OF DISCHARGE:  04/18/2014                              OPERATIVE REPORT   PREOPERATIVE DIAGNOSIS:  Occult medial meniscus tear, chondral flap tear of the right knee.  POSTOPERATIVE DIAGNOSIS:  Pathologic plica, right knee.  Minor grade 3 changes of the patellofemoral joint.  PROCEDURE PERFORMED: 1. Right knee arthroscopy. 2. Excision of pathologic plica. 3. Chondroplasty of patellofemoral joint.  ANESTHESIA:  General.  ASSISTANT:  None.  HISTORY:  This is a 44 year old, who has had a knee injury and persistent pain, despite rest, activity, modification, injection.  MRI indicating thickening of the medial collateral ligament.  He has had persistent pain with giving way symptoms.  He was indicated for diagnostic arthroscopy, pain on medial joint line, possible meniscectomy and chondroplasty.  Risk and benefits discussed including bleeding, infection, damage to neurovascular structures, DVT, PE, anesthetic complications, no change in symptoms, worsening symptoms, etc.  TECHNIQUE:  With the patient in supine position, after induction of adequate general anesthesia, 2 g Kefzol, the right lower extremity was prepped and draped in usual sterile fashion.  A lateral parapatellar portal was fashioned with a #11 blade.  Ingress cannula atraumatically placed.  Irrigant was utilized to insufflate the joint.  Under direct visualization, a medial parapatellar portal was fashioned with #11 blade after localization with an 18-gauge needle.  Noted along the medial meniscus, was a stable medial meniscus without evidence of tear.  We had performed an exam under anesthesia.  Prior to that, had a negative anterior or posterior drawer, negative  instability of varus and valgus stress at 0 and 30 degrees.  We examined the medial compartment.  He had normal femoral condyle and tibial plateau and meniscus stable to probe palpation.  We used 65 mmHg.  ACL was unremarkable and had a good endpoint with anterior drawer.  Lateral compartment revealed normal femoral condyle, tibial plateau, and meniscus stable to probe palpation. Suprapatellar pouch revealed a minor grade 3 changes of the patellofemoral joint.  Light chondroplasty performed here.  There was a plica noted medially with flexion and extension seemed to be impinging upon the lateral condyle.  There was an area where his pain was, therefore shaved, excised the plica to a stable base.  There was normal patellofemoral tracking.  Gutters were unremarkable.  I revisited all compartments, no further pathology amenable to arthroscopic intervention.  I, therefore, removed all instrumentation.  Portals were closed with 4-0 nylon simple sutures.  0.25% Marcaine with epinephrine was infiltrated in the joint.  Wound was dressed sterilely.  Awoken without difficulty and transported to the recovery room in satisfactory condition.  The patient tolerated the procedure well.  No complications.  No assistant.  Minimal blood loss.     Susa Day, M.D.     Geralynn Rile  D:  04/18/2014  T:  04/19/2014  Job:  [id number]

## 2014-05-05 ENCOUNTER — Other Ambulatory Visit: Payer: Self-pay | Admitting: Gastroenterology

## 2014-05-05 DIAGNOSIS — R1310 Dysphagia, unspecified: Secondary | ICD-10-CM

## 2014-05-08 ENCOUNTER — Ambulatory Visit (HOSPITAL_COMMUNITY)
Admission: RE | Admit: 2014-05-08 | Discharge: 2014-05-08 | Disposition: A | Discharge status: Home / Self Care | Payer: 59 | Source: Ambulatory Visit | Attending: Gastroenterology | Admitting: Gastroenterology

## 2014-05-08 DIAGNOSIS — R1310 Dysphagia, unspecified: Secondary | ICD-10-CM

## 2014-05-23 ENCOUNTER — Other Ambulatory Visit: Payer: Self-pay | Admitting: Internal Medicine

## 2014-05-27 NOTE — Telephone Encounter (Signed)
Called and left message for pt, notified due for appt and to please call to schedule prior to next refill being due.

## 2014-06-06 NOTE — Telephone Encounter (Signed)
Mailed unread message to pt  

## 2015-01-17 ENCOUNTER — Encounter (HOSPITAL_COMMUNITY): Payer: Self-pay | Admitting: Emergency Medicine

## 2015-01-17 ENCOUNTER — Emergency Department (HOSPITAL_COMMUNITY)
Admission: EM | Admit: 2015-01-17 | Discharge: 2015-01-17 | Disposition: A | Discharge status: Home / Self Care | Payer: Managed Care, Other (non HMO) | Attending: Emergency Medicine | Admitting: Emergency Medicine

## 2015-01-17 ENCOUNTER — Emergency Department (HOSPITAL_COMMUNITY): Payer: Managed Care, Other (non HMO)

## 2015-01-17 DIAGNOSIS — K859 Acute pancreatitis without necrosis or infection, unspecified: Secondary | ICD-10-CM | POA: Diagnosis not present

## 2015-01-17 DIAGNOSIS — I10 Essential (primary) hypertension: Secondary | ICD-10-CM | POA: Diagnosis not present

## 2015-01-17 DIAGNOSIS — Z87442 Personal history of urinary calculi: Secondary | ICD-10-CM | POA: Diagnosis not present

## 2015-01-17 DIAGNOSIS — R011 Cardiac murmur, unspecified: Secondary | ICD-10-CM | POA: Insufficient documentation

## 2015-01-17 DIAGNOSIS — R109 Unspecified abdominal pain: Secondary | ICD-10-CM | POA: Diagnosis present

## 2015-01-17 DIAGNOSIS — G4733 Obstructive sleep apnea (adult) (pediatric): Secondary | ICD-10-CM | POA: Diagnosis not present

## 2015-01-17 DIAGNOSIS — F329 Major depressive disorder, single episode, unspecified: Secondary | ICD-10-CM | POA: Diagnosis not present

## 2015-01-17 DIAGNOSIS — Z9981 Dependence on supplemental oxygen: Secondary | ICD-10-CM | POA: Diagnosis not present

## 2015-01-17 DIAGNOSIS — Z87438 Personal history of other diseases of male genital organs: Secondary | ICD-10-CM | POA: Diagnosis not present

## 2015-01-17 DIAGNOSIS — K219 Gastro-esophageal reflux disease without esophagitis: Secondary | ICD-10-CM | POA: Insufficient documentation

## 2015-01-17 DIAGNOSIS — G43909 Migraine, unspecified, not intractable, without status migrainosus: Secondary | ICD-10-CM | POA: Insufficient documentation

## 2015-01-17 DIAGNOSIS — Z79899 Other long term (current) drug therapy: Secondary | ICD-10-CM | POA: Insufficient documentation

## 2015-01-17 LAB — URINALYSIS, ROUTINE W REFLEX MICROSCOPIC
BILIRUBIN URINE: NEGATIVE
Glucose, UA: NEGATIVE mg/dL
HGB URINE DIPSTICK: NEGATIVE
Ketones, ur: NEGATIVE mg/dL
Leukocytes, UA: NEGATIVE
Nitrite: NEGATIVE
Protein, ur: NEGATIVE mg/dL
Specific Gravity, Urine: 1.02 (ref 1.005–1.030)
pH: 6 (ref 5.0–8.0)

## 2015-01-17 LAB — COMPREHENSIVE METABOLIC PANEL
ALT: 49 U/L (ref 17–63)
AST: 35 U/L (ref 15–41)
Albumin: 4.1 g/dL (ref 3.5–5.0)
Alkaline Phosphatase: 103 U/L (ref 38–126)
Anion gap: 6 (ref 5–15)
BUN: 12 mg/dL (ref 6–20)
CALCIUM: 9.2 mg/dL (ref 8.9–10.3)
CO2: 29 mmol/L (ref 22–32)
CREATININE: 1.01 mg/dL (ref 0.61–1.24)
Chloride: 104 mmol/L (ref 101–111)
Glucose, Bld: 108 mg/dL — ABNORMAL HIGH (ref 65–99)
Potassium: 4.2 mmol/L (ref 3.5–5.1)
SODIUM: 139 mmol/L (ref 135–145)
Total Bilirubin: 0.9 mg/dL (ref 0.3–1.2)
Total Protein: 6.9 g/dL (ref 6.5–8.1)

## 2015-01-17 LAB — CBC
HCT: 42 % (ref 39.0–52.0)
Hemoglobin: 14.3 g/dL (ref 13.0–17.0)
MCH: 30.1 pg (ref 26.0–34.0)
MCHC: 34 g/dL (ref 30.0–36.0)
MCV: 88.4 fL (ref 78.0–100.0)
Platelets: 230 10*3/uL (ref 150–400)
RBC: 4.75 MIL/uL (ref 4.22–5.81)
RDW: 12.2 % (ref 11.5–15.5)
WBC: 6.7 10*3/uL (ref 4.0–10.5)

## 2015-01-17 LAB — LIPASE, BLOOD: Lipase: 148 U/L — ABNORMAL HIGH (ref 11–51)

## 2015-01-17 MED ORDER — OXYCODONE-ACETAMINOPHEN 5-325 MG PO TABS
1.0000 | ORAL_TABLET | ORAL | Status: DC | PRN
Start: 2015-01-17 — End: 2016-05-03

## 2015-01-17 MED ORDER — SODIUM CHLORIDE 0.9 % IV BOLUS (SEPSIS)
1000.0000 mL | Freq: Once | INTRAVENOUS | Status: AC
  Administered 2015-01-17: 1000 mL via INTRAVENOUS

## 2015-01-17 MED ORDER — HYDROMORPHONE HCL 1 MG/ML IJ SOLN
1.0000 mg | Freq: Once | INTRAMUSCULAR | Status: AC
  Administered 2015-01-17: 1 mg via INTRAVENOUS
  Filled 2015-01-17: qty 1

## 2015-01-17 MED ORDER — ONDANSETRON HCL 4 MG PO TABS: 4.0000 mg | ORAL_TABLET | Freq: Four times a day (QID) | ORAL | Status: DC

## 2015-01-17 MED ORDER — ONDANSETRON HCL 4 MG/2ML IJ SOLN
4.0000 mg | Freq: Once | INTRAMUSCULAR | Status: AC
  Administered 2015-01-17: 4 mg via INTRAVENOUS
  Filled 2015-01-17: qty 2

## 2015-01-17 MED ORDER — IOHEXOL 300 MG/ML  SOLN
100.0000 mL | Freq: Once | INTRAMUSCULAR | Status: AC | PRN
  Administered 2015-01-17: 100 mL via INTRAVENOUS

## 2015-01-17 NOTE — ED Provider Notes (Signed)
CSN: [id number]     Arrival date & time 01/17/15  [id number] History   First MD Initiated Contact with Patient 01/17/15 1003     Chief Complaint  Patient presents with  . Abdominal Pain  . Back Pain  . Migraine     (Consider location/radiation/quality/duration/timing/severity/associated sxs/prior Treatment) HPI John [name] is a 44 y.o. male status post appendectomy, Nissen fundoplication, history of IBS, GERD, hypertension, chronic migraines, comes in for evaluation of abdominal pain and back pain. Patient reports he has been struggling with IBS for many years and he is currently having abdominal discomfort that he states is similar to previous IBS discomfort. He describes it as diffuse, bloating sensation ongoing for the past 5 weeks. Worse after eating. Discomfort radiates throughout his entire abdomen and his back below his shoulder blades. Overall discomfort rated as a 7. He reports 2 loose stools yesterday, but none today. No bloody stool or dark stool. Denies alcohol or drug use. No fevers, chills, vomiting, urinary symptoms, constipation. He also reports intermittent shortness of breath that he says has been ongoing for the past year. He reports he is followed by his primary care doctor for this problem and is currently on an albuterol inhaler trial. No shortness of breath now in the ED, no chest pain. Reports he missed his PCP appointment this week due to a migraine, rescheduled for next week. No headache now in the ED. No vision changes, numbness or weakness.   Past Medical History  Diagnosis Date  . Barrett esophagus   . Hypertension   . GERD (gastroesophageal reflux disease)   . H/O hiatal hernia   . History of kidney stones   . History of cardiac murmur as a child   . OSA on CPAP   . Migraine   . BPH (benign prostatic hypertrophy)   . Hx of chronic cholecystitis     w/ stone  . History of cardiac murmur as a child   . Depression    Past Surgical History  Procedure  Laterality Date  . Upper gi endoscopy  2013  . Esophageal manometry N/A 11/12/2012    Procedure: ESOPHAGEAL MANOMETRY (EM);  Surgeon: Beryle Beams, MD;  Location: [facility];  Service: Endoscopy;  Laterality: N/A;  . Nasal septum surgery  2002  . Laparoscopic nissen fundoplication N/A [id number]    Procedure: LAPAROSCOPIC NISSEN FUNDOPLICATION;  Surgeon: Pedro Earls, MD;  Location: [facility];  Service: General;  Laterality: N/A;  . Appendectomy  1999  . Knee arthroscopy Right 04/18/2014    Procedure: RIGHT ARTHROSCOPY KNEE / PLICA EXCISION;  Surgeon: Johnn Hai, MD;  Location: [facility];  Service: Orthopedics;  Laterality: Right;   Family History  Problem Relation Age of Onset  . Cancer Father     skin  . Diabetes Father   . Cancer Paternal Aunt     breast  . Cancer Mother     pancreatic 2010  . Hypertension Mother    Social History  Substance Use Topics  . Smoking status: Never Smoker   . Smokeless tobacco: Never Used  . Alcohol Use: No    Review of Systems A 10 point review of systems was completed and was negative except for pertinent positives and negatives as mentioned in the history of present illness     Allergies  Doxycycline and Nitroglycerin  Home Medications   Prior to Admission medications   Medication Sig Start Date End Date Taking? Authorizing Provider  albuterol (  PROVENTIL HFA;VENTOLIN HFA) 108 (90 BASE) MCG/ACT inhaler Inhale 1-2 puffs into the lungs every 6 (six) hours as needed for wheezing or shortness of breath.   Yes Historical Provider, MD  baclofen (LIORESAL) 10 MG tablet Take 10-20 mg by mouth 2 (two) times daily as needed (for muscle spasms).    Yes Historical Provider, MD  Diclofenac Potassium (CAMBIA) 50 MG PACK Take 50 mg by mouth 2 (two) times daily as needed. Migraines   Yes Historical Provider, MD  esomeprazole (NEXIUM) 40 MG capsule Take 40 mg by mouth daily before breakfast.   Yes Historical Provider, MD   lamoTRIgine (LAMICTAL) 200 MG tablet Take 200 mg by mouth every evening.    Yes Historical Provider, MD  lisinopril (PRINIVIL,ZESTRIL) 10 MG tablet Take 10 mg by mouth every morning.   Yes Historical Provider, MD  ondansetron (ZOFRAN) 4 MG tablet Take 1 tablet (4 mg total) by mouth every 6 (six) hours. 01/17/15   Comer Locket, PA-C  oxyCODONE-acetaminophen (PERCOCET/ROXICET) 5-325 MG tablet Take 1-2 tablets by mouth every 4 (four) hours as needed for severe pain. 01/17/15   Comer Locket, PA-C   BP 129/84 mmHg  Pulse 67  Temp(Src) 98.5 F (36.9 C)  Resp 16  Ht 5\' 8"  (1.727 m)  Wt 83.008 kg  BMI 27.83 kg/m2  SpO2 97% Physical Exam  Constitutional: He is oriented to person, place, and time. He appears well-developed and well-nourished.  Well-appearing, Caucasian male  HENT:  Head: Normocephalic and atraumatic.  Mouth/Throat: Oropharynx is clear and moist.  Eyes: Conjunctivae are normal. Pupils are equal, round, and reactive to light. Right eye exhibits no discharge. Left eye exhibits no discharge. No scleral icterus.  Neck: Neck supple.  Cardiovascular: Normal rate, regular rhythm and normal heart sounds.   Pulmonary/Chest: Effort normal and breath sounds normal. No respiratory distress. He has no wheezes. He has no rales.  Abdominal: Soft. He exhibits no distension and no mass. There is no tenderness. There is no rebound and no guarding.  No pulsatile mass. No other lesions or deformities.  Musculoskeletal: He exhibits no tenderness.  Neurological: He is alert and oriented to person, place, and time.  Cranial Nerves II-XII grossly intact  Skin: Skin is warm and dry. No rash noted.  Psychiatric: He has a normal mood and affect.  Nursing note and vitals reviewed.   ED Course  Procedures (including critical care time) Labs Review Labs Reviewed  LIPASE, BLOOD - Abnormal; Notable for the following:    Lipase 148 (*)    All other components within normal limits  COMPREHENSIVE  METABOLIC PANEL - Abnormal; Notable for the following:    Glucose, Bld 108 (*)    All other components within normal limits  CBC  URINALYSIS, ROUTINE W REFLEX MICROSCOPIC (NOT AT [facility])    Imaging Review Ct Abdomen Pelvis W Contrast  01/17/2015  CLINICAL DATA:  Epigastric abdominal pain. EXAM: CT ABDOMEN AND PELVIS WITH CONTRAST TECHNIQUE: Multidetector CT imaging of the abdomen and pelvis was performed using the standard protocol following bolus administration of intravenous contrast. CONTRAST:  143mL OMNIPAQUE IOHEXOL 300 MG/ML  SOLN COMPARISON:  CT of the abdomen and pelvis without contrast on 04/29/2014 FINDINGS: Clips near the diaphragmatic hiatus reflect prior Nissen fundoplication. No evidence of recurrent hiatal hernia. Within the liver, a small hypodensity in the posterior right lobe measures 0.7 x 1.2 cm. This is not fully characterized on the contrast enhanced study but likely is a benign cyst. The gallbladder, pancreas, spleen, adrenal glands  and kidneys are within normal limits. Bowel shows no evidence of inflammation or obstruction. No abnormal fluid collections are seen. No evidence of focal masses, enlarged lymph nodes or hernias. No vascular abnormalities are seen. The bladder is decompressed. No bony abnormalities are identified. IMPRESSION: No acute findings in the abdomen or pelvis. Evidence of prior Nissen fundoplication without recurrent hiatal hernia. Electronically Signed   By: Aletta Edouard M.D.   On: 01/17/2015 14:15   Dg Abd Acute W/chest  01/17/2015  CLINICAL DATA:  IBS, abdominal pain with back pain, diarrhea. EXAM: DG ABDOMEN ACUTE W/ 1V CHEST COMPARISON:  Chest x-ray dated 10/16/2012 FINDINGS: Single view of the chest: Cardiomediastinal silhouette remains normal in size and configuration. Lungs are clear. Lung volumes are normal. No pleural effusion. No pneumothorax. Slight elevation of the right hemidiaphragm is unchanged. Osseous structures about the chest are  unremarkable. Upright and supine views of the abdomen: Bowel gas pattern is nonobstructive. No evidence of soft tissue mass or abnormal fluid collection. No evidence of free intraperitoneal air. No pathologic - appearing calcifications seen. No acute/significant osseous abnormality. IMPRESSION: Lungs are clear and there is no evidence of acute cardiopulmonary abnormality. Nonobstructive bowel gas pattern and no evidence of acute intra-abdominal abnormality. Electronically Signed   By: Franki Cabot M.D.   On: 01/17/2015 11:13   I have personally reviewed and evaluated these images and lab results as part of my medical decision-making.   EKG Interpretation None     Meds given in ED:  Medications  HYDROmorphone (DILAUDID) injection 1 mg (not administered)  HYDROmorphone (DILAUDID) injection 1 mg (1 mg Intravenous Given 01/17/15 1047)  ondansetron (ZOFRAN) injection 4 mg (4 mg Intravenous Given 01/17/15 1047)  sodium chloride 0.9 % bolus 1,000 mL (0 mLs Intravenous Stopped 01/17/15 1447)  iohexol (OMNIPAQUE) 300 MG/ML solution 100 mL (100 mLs Intravenous Contrast Given 01/17/15 1347)    New Prescriptions   ONDANSETRON (ZOFRAN) 4 MG TABLET    Take 1 tablet (4 mg total) by mouth every 6 (six) hours.   OXYCODONE-ACETAMINOPHEN (PERCOCET/ROXICET) 5-325 MG TABLET    Take 1-2 tablets by mouth every 4 (four) hours as needed for severe pain.   Filed Vitals:   01/17/15 1030 01/17/15 1200 01/17/15 1300 01/17/15 1433  BP: 136/92 132/97 135/96 129/84  Pulse: 62 73 67 67  Temp:      Resp:  16 16 16   Height:      Weight:      SpO2: 97% 94% 98% 97%    MDM  Cloretta Ned Zimmers [name] is a 44 y.o. male with history of appendectomy, Nissen fundoplication, IBS, GERD comes in for admission of abdominal discomfort. Symptoms have been ongoing for the previous 5 weeks and are similar to his previous IBS discomfort. On arrival, he is hemodynamically stable with normal vital signs and is afebrile. He has an unremarkable  abdominal exam. Basic labs show elevation of lipase at 148. Patient denies any alcohol use. Obtain CT abdomen to rule out possible neoplasm as cause of pancreatitis. Symptoms likely due to mild pancreatitis at this time. Low suspicion for other acute or emergent pathology at this time. Will treat with pain medicines, antiemetics and encouraged to keep follow-up appointment with PCP next week for reevaluation. Overall, patient appears well, nontoxic, hemodynamically stable with normal vital signs is afebrile. He is tolerating fluids by mouth in the ED without any nausea or vomiting. Appropriate for discharge. Prior to discharge, I discussed and reviewed this case my attending, Dr. Stark Jock  who agrees with above plan. Final diagnoses:  Acute pancreatitis, unspecified pancreatitis type        Comer Locket, PA-C 01/17/15 1511  Veryl Speak, MD 01/17/15 [id number]

## 2015-01-17 NOTE — ED Notes (Signed)
Pt. Stated, I have IBS and I've had bouts with it for 5 weeks.  I m having the abdominal pain with back pain.  I ve had diarrhea yesterday and normal today. But I have a migraine headache everyday.  I usually go to [facility] for the appt.  This past week and just could not because I had a migraine and couldn't drive. I had to reschedule my appt. Week.

## 2015-01-17 NOTE — Discharge Instructions (Signed)
You were evaluated in the ED today for abdominal discomfort. There does not appear to be an emergent cause her symptoms at this time. Her symptoms are likely due to a mild pancreatitis. It is important for you to drink plenty of clear fluids. Take your pain medicine as needed for severe pain. He may continue taking Tylenol and Motrin for moderate pain. Follow-up with your doctor next week for reevaluation. Return to ED for any new or worsening symptoms.  Acute Pancreatitis Acute pancreatitis is a disease in which the pancreas becomes suddenly inflamed. The pancreas is a large gland located behind your stomach. The pancreas produces enzymes that help digest food. The pancreas also releases the hormones glucagon and insulin that help regulate blood sugar. Damage to the pancreas occurs when the digestive enzymes from the pancreas are activated and begin attacking the pancreas before being released into the intestine. Most acute attacks last a couple of days and can cause serious complications. Some people become dehydrated and develop low blood pressure. In severe cases, bleeding into the pancreas can lead to shock and can be life-threatening. The lungs, heart, and kidneys may fail. CAUSES  Pancreatitis can happen to anyone. In some cases, the cause is unknown. Most cases are caused by:  Alcohol abuse.  Gallstones. Other less common causes are:  Certain medicines.  Exposure to certain chemicals.  Infection.  Damage caused by an accident (trauma).  Abdominal surgery. SYMPTOMS   Pain in the upper abdomen that may radiate to the back.  Tenderness and swelling of the abdomen.  Nausea and vomiting. DIAGNOSIS  Your caregiver will perform a physical exam. Blood and stool tests may be done to confirm the diagnosis. Imaging tests may also be done, such as X-rays, CT scans, or an ultrasound of the abdomen. TREATMENT  Treatment usually requires a stay in the hospital. Treatment may include:  Pain  medicine.  Fluid replacement through an intravenous line (IV).  Placing a tube in the stomach to remove stomach contents and control vomiting.  Not eating for 3 or 4 days. This gives your pancreas a rest, because enzymes are not being produced that can cause further damage.  Antibiotic medicines if your condition is caused by an infection.  Surgery of the pancreas or gallbladder. HOME CARE INSTRUCTIONS   Follow the diet advised by your caregiver. This may involve avoiding alcohol and decreasing the amount of fat in your diet.  Eat smaller, more frequent meals. This reduces the amount of digestive juices the pancreas produces.  Drink enough fluids to keep your urine clear or pale yellow.  Only take over-the-counter or prescription medicines as directed by your caregiver.  Avoid drinking alcohol if it caused your condition.  Do not smoke.  Get plenty of rest.  Check your blood sugar at home as directed by your caregiver.  Keep all follow-up appointments as directed by your caregiver. SEEK MEDICAL CARE IF:   You do not recover as quickly as expected.  You develop new or worsening symptoms.  You have persistent pain, weakness, or nausea.  You recover and then have another episode of pain. SEEK IMMEDIATE MEDICAL CARE IF:   You are unable to eat or keep fluids down.  Your pain becomes severe.  You have a fever or persistent symptoms for more than 2 to 3 days.  You have a fever and your symptoms suddenly get worse.  Your skin or the white part of your eyes turn yellow (jaundice).  You develop vomiting.  You  feel dizzy, or you faint.  Your blood sugar is high (over 300 mg/dL). MAKE SURE YOU:   Understand these instructions.  Will watch your condition.  Will get help right away if you are not doing well or get worse.   This information is not intended to replace advice given to you by your health care provider. Make sure you discuss any questions you have with  your health care provider.   Document Released: 01/31/2005 Document Revised: 08/02/2011 Document Reviewed: 05/12/2011 Elsevier Interactive Patient Education [employer].

## 2015-01-17 NOTE — ED Notes (Signed)
Patient transported to X-ray 

## 2016-05-02 ENCOUNTER — Encounter (HOSPITAL_COMMUNITY): Payer: Self-pay | Admitting: Emergency Medicine

## 2016-05-02 ENCOUNTER — Observation Stay (HOSPITAL_COMMUNITY)
Admission: EM | Admit: 2016-05-02 | Discharge: 2016-05-03 | Disposition: A | Discharge status: Home / Self Care | Payer: Managed Care, Other (non HMO) | Attending: General Surgery | Admitting: General Surgery

## 2016-05-02 ENCOUNTER — Emergency Department (HOSPITAL_COMMUNITY): Payer: Managed Care, Other (non HMO) | Admitting: Anesthesiology

## 2016-05-02 ENCOUNTER — Encounter (HOSPITAL_COMMUNITY)
Admission: EM | Disposition: A | Discharge status: Home / Self Care | Payer: Self-pay | Source: Home / Self Care | Attending: Emergency Medicine

## 2016-05-02 ENCOUNTER — Emergency Department (HOSPITAL_COMMUNITY): Payer: Managed Care, Other (non HMO)

## 2016-05-02 DIAGNOSIS — K8010 Calculus of gallbladder with chronic cholecystitis without obstruction: Principal | ICD-10-CM | POA: Insufficient documentation

## 2016-05-02 DIAGNOSIS — Z419 Encounter for procedure for purposes other than remedying health state, unspecified: Secondary | ICD-10-CM

## 2016-05-02 DIAGNOSIS — K219 Gastro-esophageal reflux disease without esophagitis: Secondary | ICD-10-CM | POA: Insufficient documentation

## 2016-05-02 DIAGNOSIS — K819 Cholecystitis, unspecified: Secondary | ICD-10-CM

## 2016-05-02 DIAGNOSIS — G4733 Obstructive sleep apnea (adult) (pediatric): Secondary | ICD-10-CM | POA: Insufficient documentation

## 2016-05-02 DIAGNOSIS — I10 Essential (primary) hypertension: Secondary | ICD-10-CM | POA: Diagnosis not present

## 2016-05-02 DIAGNOSIS — Z79899 Other long term (current) drug therapy: Secondary | ICD-10-CM | POA: Insufficient documentation

## 2016-05-02 DIAGNOSIS — K8020 Calculus of gallbladder without cholecystitis without obstruction: Secondary | ICD-10-CM | POA: Diagnosis present

## 2016-05-02 DIAGNOSIS — K810 Acute cholecystitis: Secondary | ICD-10-CM | POA: Diagnosis present

## 2016-05-02 DIAGNOSIS — G43909 Migraine, unspecified, not intractable, without status migrainosus: Secondary | ICD-10-CM | POA: Diagnosis not present

## 2016-05-02 HISTORY — DX: Calculus of gallbladder without cholecystitis without obstruction: K80.20

## 2016-05-02 HISTORY — PX: CHOLECYSTECTOMY: SHX55

## 2016-05-02 LAB — LIPASE, BLOOD: LIPASE: 19 U/L (ref 11–51)

## 2016-05-02 LAB — CBC
HCT: 44.7 % (ref 39.0–52.0)
Hemoglobin: 15.4 g/dL (ref 13.0–17.0)
MCH: 30.3 pg (ref 26.0–34.0)
MCHC: 34.5 g/dL (ref 30.0–36.0)
MCV: 87.8 fL (ref 78.0–100.0)
PLATELETS: 268 10*3/uL (ref 150–400)
RBC: 5.09 MIL/uL (ref 4.22–5.81)
RDW: 12.6 % (ref 11.5–15.5)
WBC: 13.3 10*3/uL — AB (ref 4.0–10.5)

## 2016-05-02 LAB — URINALYSIS, ROUTINE W REFLEX MICROSCOPIC
BILIRUBIN URINE: NEGATIVE
GLUCOSE, UA: NEGATIVE mg/dL
Hgb urine dipstick: NEGATIVE
KETONES UR: NEGATIVE mg/dL
LEUKOCYTES UA: NEGATIVE
Nitrite: NEGATIVE
PROTEIN: NEGATIVE mg/dL
Specific Gravity, Urine: 1.008 (ref 1.005–1.030)
pH: 6 (ref 5.0–8.0)

## 2016-05-02 LAB — COMPREHENSIVE METABOLIC PANEL
ALT: 86 U/L — AB (ref 17–63)
AST: 44 U/L — AB (ref 15–41)
Albumin: 4.6 g/dL (ref 3.5–5.0)
Alkaline Phosphatase: 112 U/L (ref 38–126)
Anion gap: 10 (ref 5–15)
BILIRUBIN TOTAL: 0.9 mg/dL (ref 0.3–1.2)
BUN: 12 mg/dL (ref 6–20)
CHLORIDE: 99 mmol/L — AB (ref 101–111)
CO2: 28 mmol/L (ref 22–32)
CREATININE: 1.03 mg/dL (ref 0.61–1.24)
Calcium: 9.4 mg/dL (ref 8.9–10.3)
GFR calc Af Amer: >60 mL/min (ref >60)
Glucose, Bld: 119 mg/dL — ABNORMAL HIGH (ref 65–99)
Potassium: 3.8 mmol/L (ref 3.5–5.1)
Sodium: 137 mmol/L (ref 135–145)
Total Protein: 7.4 g/dL (ref 6.5–8.1)

## 2016-05-02 SURGERY — LAPAROSCOPIC CHOLECYSTECTOMY WITH INTRAOPERATIVE CHOLANGIOGRAM
Anesthesia: General | Site: Abdomen

## 2016-05-02 MED ORDER — ONDANSETRON HCL 4 MG/2ML IJ SOLN
INTRAMUSCULAR | Status: DC | PRN
  Administered 2016-05-02: 4 mg via INTRAVENOUS

## 2016-05-02 MED ORDER — HYDROMORPHONE HCL 1 MG/ML IJ SOLN
0.2500 mg | INTRAMUSCULAR | Status: DC | PRN
  Administered 2016-05-02 (×2): 0.5 mg via INTRAVENOUS

## 2016-05-02 MED ORDER — SUGAMMADEX SODIUM 500 MG/5ML IV SOLN
INTRAVENOUS | Status: AC
  Filled 2016-05-02: qty 5

## 2016-05-02 MED ORDER — HYDROMORPHONE HCL 1 MG/ML IJ SOLN
1.0000 mg | Freq: Once | INTRAMUSCULAR | Status: AC
  Administered 2016-05-02: 1 mg via INTRAVENOUS
  Filled 2016-05-02: qty 1

## 2016-05-02 MED ORDER — IOPAMIDOL (ISOVUE-300) INJECTION 61%
INTRAVENOUS | Status: AC
Start: 2016-05-02 — End: 2016-05-02
  Filled 2016-05-02: qty 50

## 2016-05-02 MED ORDER — SUGAMMADEX SODIUM 200 MG/2ML IV SOLN
INTRAVENOUS | Status: DC | PRN
  Administered 2016-05-02: 500 mg via INTRAVENOUS

## 2016-05-02 MED ORDER — CEFTRIAXONE SODIUM 2 G IJ SOLR
2.0000 g | Freq: Once | INTRAMUSCULAR | Status: AC
  Administered 2016-05-02: 12:00:00 via INTRAVENOUS
  Filled 2016-05-02: qty 2

## 2016-05-02 MED ORDER — ONDANSETRON 4 MG PO TBDP: 4.0000 mg | ORAL_TABLET | Freq: Four times a day (QID) | ORAL | Status: DC | PRN

## 2016-05-02 MED ORDER — ONDANSETRON HCL 4 MG/2ML IJ SOLN: 4.0000 mg | Freq: Four times a day (QID) | INTRAMUSCULAR | Status: DC | PRN

## 2016-05-02 MED ORDER — PROPOFOL 10 MG/ML IV BOLUS
INTRAVENOUS | Status: DC | PRN
  Administered 2016-05-02: 200 mg via INTRAVENOUS

## 2016-05-02 MED ORDER — SODIUM CHLORIDE 0.9 % IV SOLN
INTRAVENOUS | Status: DC
  Administered 2016-05-02: 16:00:00 via INTRAVENOUS
  Administered 2016-05-03: 1 mL via INTRAVENOUS

## 2016-05-02 MED ORDER — KETOROLAC TROMETHAMINE 15 MG/ML IJ SOLN
INTRAMUSCULAR | Status: AC
  Filled 2016-05-02: qty 2

## 2016-05-02 MED ORDER — HYDROMORPHONE HCL 1 MG/ML IJ SOLN
INTRAMUSCULAR | Status: AC
  Filled 2016-05-02: qty 1

## 2016-05-02 MED ORDER — FENTANYL CITRATE (PF) 100 MCG/2ML IJ SOLN
INTRAMUSCULAR | Status: AC
Start: 2016-05-02 — End: 2016-05-02
  Filled 2016-05-02: qty 4

## 2016-05-02 MED ORDER — PROPOFOL 10 MG/ML IV BOLUS
INTRAVENOUS | Status: AC
  Filled 2016-05-02: qty 20

## 2016-05-02 MED ORDER — LIDOCAINE 2% (20 MG/ML) 5 ML SYRINGE
INTRAMUSCULAR | Status: AC
  Filled 2016-05-02: qty 10

## 2016-05-02 MED ORDER — PHENYLEPHRINE 40 MCG/ML (10ML) SYRINGE FOR IV PUSH (FOR BLOOD PRESSURE SUPPORT)
PREFILLED_SYRINGE | INTRAVENOUS | Status: AC
  Filled 2016-05-02: qty 10

## 2016-05-02 MED ORDER — ACETAMINOPHEN 500 MG PO TABS
1000.0000 mg | ORAL_TABLET | Freq: Once | ORAL | Status: AC
  Administered 2016-05-02: 1000 mg via ORAL

## 2016-05-02 MED ORDER — MIDAZOLAM HCL 5 MG/5ML IJ SOLN
INTRAMUSCULAR | Status: DC | PRN
  Administered 2016-05-02: 2 mg via INTRAVENOUS

## 2016-05-02 MED ORDER — MORPHINE SULFATE (PF) 2 MG/ML IV SOLN
2.0000 mg | INTRAVENOUS | Status: DC | PRN
  Administered 2016-05-02 – 2016-05-03 (×2): 2 mg via INTRAVENOUS
  Filled 2016-05-02 (×2): qty 1

## 2016-05-02 MED ORDER — SODIUM CHLORIDE 0.9 % IR SOLN
Status: DC | PRN
  Administered 2016-05-02: 1000 mL

## 2016-05-02 MED ORDER — 0.9 % SODIUM CHLORIDE (POUR BTL) OPTIME
TOPICAL | Status: DC | PRN
  Administered 2016-05-02: 1000 mL

## 2016-05-02 MED ORDER — ROCURONIUM BROMIDE 100 MG/10ML IV SOLN
INTRAVENOUS | Status: DC | PRN
  Administered 2016-05-02: 50 mg via INTRAVENOUS

## 2016-05-02 MED ORDER — BUPIVACAINE HCL 0.25 % IJ SOLN
INTRAMUSCULAR | Status: DC | PRN
  Administered 2016-05-02: 30 mL

## 2016-05-02 MED ORDER — FENTANYL CITRATE (PF) 100 MCG/2ML IJ SOLN
INTRAMUSCULAR | Status: DC | PRN
  Administered 2016-05-02 (×2): 50 ug via INTRAVENOUS
  Administered 2016-05-02: 100 ug via INTRAVENOUS

## 2016-05-02 MED ORDER — SIMETHICONE 80 MG PO CHEW: 40.0000 mg | CHEWABLE_TABLET | Freq: Four times a day (QID) | ORAL | Status: DC | PRN

## 2016-05-02 MED ORDER — MEPERIDINE HCL 25 MG/ML IJ SOLN
6.2500 mg | INTRAMUSCULAR | Status: DC | PRN
Start: 2016-05-02 — End: 2016-05-02

## 2016-05-02 MED ORDER — MIDAZOLAM HCL 2 MG/2ML IJ SOLN
INTRAMUSCULAR | Status: AC
  Filled 2016-05-02: qty 2

## 2016-05-02 MED ORDER — ACETAMINOPHEN 500 MG PO TABS
ORAL_TABLET | ORAL | Status: AC
  Filled 2016-05-02: qty 2

## 2016-05-02 MED ORDER — KETOROLAC TROMETHAMINE 30 MG/ML IJ SOLN: 30.0000 mg | Freq: Four times a day (QID) | INTRAMUSCULAR | Status: DC | PRN

## 2016-05-02 MED ORDER — SODIUM CHLORIDE 0.9 % IV BOLUS (SEPSIS)
1000.0000 mL | Freq: Once | INTRAVENOUS | Status: AC
  Administered 2016-05-02: 1000 mL via INTRAVENOUS

## 2016-05-02 MED ORDER — ACETAMINOPHEN 650 MG RE SUPP: 650.0000 mg | Freq: Four times a day (QID) | RECTAL | Status: DC | PRN

## 2016-05-02 MED ORDER — PROMETHAZINE HCL 25 MG/ML IJ SOLN: 6.2500 mg | INTRAMUSCULAR | Status: DC | PRN

## 2016-05-02 MED ORDER — KETOROLAC TROMETHAMINE 30 MG/ML IJ SOLN
30.0000 mg | Freq: Four times a day (QID) | INTRAMUSCULAR | Status: DC
  Administered 2016-05-02 – 2016-05-03 (×2): 30 mg via INTRAVENOUS
  Filled 2016-05-02 (×2): qty 1

## 2016-05-02 MED ORDER — HYDRALAZINE HCL 20 MG/ML IJ SOLN: 10.0000 mg | INTRAMUSCULAR | Status: DC | PRN

## 2016-05-02 MED ORDER — LACTATED RINGERS IV SOLN
INTRAVENOUS | Status: DC
  Administered 2016-05-02 (×3): via INTRAVENOUS

## 2016-05-02 MED ORDER — HYDROCODONE-ACETAMINOPHEN 5-325 MG PO TABS
1.0000 | ORAL_TABLET | ORAL | Status: DC | PRN
  Administered 2016-05-02: 2 via ORAL
  Filled 2016-05-02: qty 2

## 2016-05-02 MED ORDER — ROCURONIUM BROMIDE 50 MG/5ML IV SOSY
PREFILLED_SYRINGE | INTRAVENOUS | Status: AC
  Filled 2016-05-02: qty 10

## 2016-05-02 MED ORDER — KETOROLAC TROMETHAMINE 30 MG/ML IJ SOLN
30.0000 mg | Freq: Once | INTRAMUSCULAR | Status: AC
  Administered 2016-05-02: 30 mg via INTRAVENOUS

## 2016-05-02 MED ORDER — GLYCOPYRROLATE 0.2 MG/ML IJ SOLN
INTRAMUSCULAR | Status: DC | PRN
  Administered 2016-05-02: 0.2 mg via INTRAVENOUS

## 2016-05-02 MED ORDER — SODIUM CHLORIDE 0.9 % IV SOLN
INTRAVENOUS | Status: DC | PRN
  Administered 2016-05-02: 100 mL

## 2016-05-02 MED ORDER — BUPIVACAINE HCL (PF) 0.25 % IJ SOLN
INTRAMUSCULAR | Status: AC
  Filled 2016-05-02: qty 30

## 2016-05-02 MED ORDER — LIDOCAINE HCL (CARDIAC) 20 MG/ML IV SOLN
INTRAVENOUS | Status: DC | PRN
  Administered 2016-05-02: 100 mg via INTRAVENOUS

## 2016-05-02 MED ORDER — ONDANSETRON HCL 4 MG/2ML IJ SOLN
4.0000 mg | Freq: Once | INTRAMUSCULAR | Status: AC
  Administered 2016-05-02: 4 mg via INTRAVENOUS
  Filled 2016-05-02: qty 2

## 2016-05-02 MED ORDER — ACETAMINOPHEN 325 MG PO TABS: 650.0000 mg | ORAL_TABLET | Freq: Four times a day (QID) | ORAL | Status: DC | PRN

## 2016-05-02 SURGICAL SUPPLY — 45 items
APPLIER CLIP ROT 10 11.4 M/L (STAPLE) ×2
BLADE CLIPPER SURG (BLADE) IMPLANT
CANISTER SUCT 3000ML PPV (MISCELLANEOUS) ×2 IMPLANT
CATH CHOLANG 76X19 KUMAR (CATHETERS) ×2 IMPLANT
CHLORAPREP W/TINT 26ML (MISCELLANEOUS) ×2 IMPLANT
CLIP APPLIE ROT 10 11.4 M/L (STAPLE) ×1 IMPLANT
CLIP LIGATING HEMO LOK XL GOLD (MISCELLANEOUS) ×2 IMPLANT
CLIP LIGATING HEMO O LOK GREEN (MISCELLANEOUS) IMPLANT
COVER MAYO STAND STRL (DRAPES) ×2 IMPLANT
COVER SURGICAL LIGHT HANDLE (MISCELLANEOUS) ×2 IMPLANT
DECANTER SPIKE VIAL GLASS SM (MISCELLANEOUS) ×2 IMPLANT
DERMABOND ADVANCED (GAUZE/BANDAGES/DRESSINGS) ×1
DERMABOND ADVANCED .7 DNX12 (GAUZE/BANDAGES/DRESSINGS) ×1 IMPLANT
DRAPE C-ARM 42X72 X-RAY (DRAPES) ×2 IMPLANT
ELECT REM PT RETURN 9FT ADLT (ELECTROSURGICAL) ×2
ELECTRODE REM PT RTRN 9FT ADLT (ELECTROSURGICAL) ×1 IMPLANT
GLOVE BIOGEL PI IND STRL 6.5 (GLOVE) ×1 IMPLANT
GLOVE BIOGEL PI IND STRL 7.0 (GLOVE) ×1 IMPLANT
GLOVE BIOGEL PI INDICATOR 6.5 (GLOVE) ×1
GLOVE BIOGEL PI INDICATOR 7.0 (GLOVE) ×1
GLOVE ECLIPSE 7.5 STRL STRAW (GLOVE) ×2 IMPLANT
GLOVE INDICATOR 7.5 STRL GRN (GLOVE) ×2 IMPLANT
GLOVE SURG SS PI 6.0 STRL IVOR (GLOVE) ×2 IMPLANT
GLOVE SURG SS PI 7.0 STRL IVOR (GLOVE) ×2 IMPLANT
GOWN STRL REUS W/ TWL LRG LVL3 (GOWN DISPOSABLE) ×3 IMPLANT
GOWN STRL REUS W/TWL LRG LVL3 (GOWN DISPOSABLE) ×3
GRASPER SUT TROCAR 14GX15 (MISCELLANEOUS) ×2 IMPLANT
KIT BASIN OR (CUSTOM PROCEDURE TRAY) ×2 IMPLANT
KIT ROOM TURNOVER OR (KITS) ×2 IMPLANT
NS IRRIG 1000ML POUR BTL (IV SOLUTION) ×2 IMPLANT
PAD ARMBOARD 7.5X6 YLW CONV (MISCELLANEOUS) ×2 IMPLANT
POUCH RETRIEVAL ECOSAC 10 (ENDOMECHANICALS) ×1 IMPLANT
POUCH RETRIEVAL ECOSAC 10MM (ENDOMECHANICALS) ×1
SCISSORS LAP 5X35 DISP (ENDOMECHANICALS) ×2 IMPLANT
SET IRRIG TUBING LAPAROSCOPIC (IRRIGATION / IRRIGATOR) ×2 IMPLANT
SLEEVE ENDOPATH XCEL 5M (ENDOMECHANICALS) ×4 IMPLANT
SPECIMEN JAR SMALL (MISCELLANEOUS) ×2 IMPLANT
STOPCOCK 4 WAY LG BORE MALE ST (IV SETS) ×2 IMPLANT
SUT MNCRL AB 4-0 PS2 18 (SUTURE) ×2 IMPLANT
TOWEL OR 17X24 6PK STRL BLUE (TOWEL DISPOSABLE) ×2 IMPLANT
TOWEL OR 17X26 10 PK STRL BLUE (TOWEL DISPOSABLE) IMPLANT
TRAY LAPAROSCOPIC MC (CUSTOM PROCEDURE TRAY) ×2 IMPLANT
TROCAR XCEL 12X100 BLDLESS (ENDOMECHANICALS) ×2 IMPLANT
TROCAR XCEL NON-BLD 5MMX100MML (ENDOMECHANICALS) ×2 IMPLANT
TUBING INSUFFLATION (TUBING) ×2 IMPLANT

## 2016-05-02 NOTE — Transfer of Care (Signed)
Immediate Anesthesia Transfer of Care Note  Patient: John [name]  Procedure(s) Performed: Procedure(s): LAPAROSCOPIC CHOLECYSTECTOMY WITH INTRAOPERATIVE CHOLANGIOGRAM (N/A)  Patient Location: PACU  Anesthesia Type:General  Level of Consciousness: awake and alert   Airway & Oxygen Therapy: Patient Spontanous Breathing and Patient connected to nasal cannula oxygen  Post-op Assessment: Report given to RN and Post -op Vital signs reviewed and stable  Post vital signs: Reviewed and stable  Last Vitals:  Vitals:   05/02/16 1145 05/02/16 1200  BP: (!) 133/91 (!) 143/91  Pulse: 65 71  Resp:    Temp:      Last Pain:  Vitals:   05/02/16 0655  TempSrc: Oral  PainSc: 8          Complications: No apparent anesthesia complications

## 2016-05-02 NOTE — H&P (Signed)
John [name]  John [name] 1970/12/31  [id number].    Requesting MD: Dr. Vanita Panda Chief Complaint/Reason for Consult: Abdominal pain  HPI:  John [name] is a 46 y.o. male status post appendectomy, Nissen fundoplication, history of IBS, hx of pancreatitis, GERD, hypertension, chronic migraines who presented to the ED with complaints of rapid onset abdominal pain that began around 2am with associated nausea and chills. Pain is in his RUQ, constant, severe, radiates into his back to the base of his shoulder blade and across his upper abdomen. Nothing made it worse. Last meal was yesterday at 6pm and then marshmallows around 11pm. Pt had similar pain 2 weeks ago that occurred in the middle of the night and resolved after a few hours. Pt denies CP, SOB, diarrhea, dysuria, hematuria, fever.  ED course: Labs: WBC 13.3, all other labs unremarkable [name] RUQ: Chololithiasis. Largest stone 1.2 cm. Mild gallbladder wall thickening. No Murphy sign however.  ROS:  Review of Systems  Constitutional: Positive for chills. Negative for diaphoresis and fever.  HENT: Negative for congestion and sore throat.   Eyes: Negative for pain and discharge.  Respiratory: Negative for cough and shortness of breath.   Cardiovascular: Negative for chest pain and leg swelling.  Gastrointestinal: Positive for abdominal pain.  Genitourinary: Negative for dysuria and hematuria.  Musculoskeletal: Positive for back pain. Negative for falls and neck pain.  Skin: Negative for itching and rash.  Neurological: Positive for headaches (chronic migraines). Negative for dizziness and loss of consciousness.  All other systems reviewed and are negative.    Family History  Problem Relation Age of Onset  . Cancer Father     skin  . Diabetes Father   . Cancer Mother     pancreatic 2010  . Hypertension Mother   . Cancer Paternal Aunt     breast    Past Medical History:   Diagnosis Date  . Barrett esophagus   . BPH (benign prostatic hypertrophy)   . Depression   . GERD (gastroesophageal reflux disease)   . H/O hiatal hernia   . History of cardiac murmur as a child   . History of cardiac murmur as a child   . History of kidney stones   . Hx of chronic cholecystitis    w/ stone  . Hypertension   . Migraine   . OSA on CPAP     Past Surgical History:  Procedure Laterality Date  . APPENDECTOMY  1999  . ESOPHAGEAL MANOMETRY N/A 11/12/2012   Procedure: ESOPHAGEAL MANOMETRY (EM);  Surgeon: Beryle Beams, MD;  Location: [facility];  Service: Endoscopy;  Laterality: N/A;  . KNEE ARTHROSCOPY Right 04/18/2014   Procedure: RIGHT ARTHROSCOPY KNEE / PLICA EXCISION;  Surgeon: Johnn Hai, MD;  Location: [facility];  Service: Orthopedics;  Laterality: Right;  . LAPAROSCOPIC NISSEN FUNDOPLICATION N/A 06/17/5463   Procedure: LAPAROSCOPIC NISSEN FUNDOPLICATION;  Surgeon: Pedro Earls, MD;  Location: [facility];  Service: General;  Laterality: N/A;  . NASAL SEPTUM SURGERY  2002  . UPPER GI ENDOSCOPY  2013    Social History:  reports that he has never smoked. He has never used smokeless tobacco. He reports that he does not drink alcohol or use drugs.  Allergies:  Allergies  Allergen Reactions  . Doxycycline Other (See Comments)    "Burning in throat"  . Nitroglycerin Other (See Comments)    Severe headache     (Not in a hospital admission)  Blood pressure 121/81, pulse 65, temperature 98.5 F (36.9 C), temperature source Oral, resp. rate 15, SpO2 98 %.  Physical Exam  Constitutional: He is oriented to person, place, and time and well-developed, well-nourished, and in no distress. No distress.  Well appearing white male  HENT:  Head: Normocephalic and atraumatic.  Nose: Nose normal.  Eyes: Conjunctivae are normal. Right eye exhibits no discharge. Left eye exhibits no discharge. No scleral icterus.  Neck: Normal range of motion. Neck  supple.  Cardiovascular: Normal rate, regular rhythm, S1 normal, S2 normal, normal heart sounds and intact distal pulses.  Exam reveals no gallop and no friction rub.   No murmur heard. Pulses:      Radial pulses are 2+ on the right side, and 2+ on the left side.       Posterior tibial pulses are 2+ on the right side, and 2+ on the left side.  Pulmonary/Chest: Effort normal and breath sounds normal. He has no decreased breath sounds. He has no wheezes. He has no rhonchi. He has no rales.  Abdominal: Soft. Normal appearance. He exhibits no distension. Bowel sounds are hyperactive. There is no tenderness. There is no rigidity, no rebound, no guarding and no CVA tenderness.  Musculoskeletal: Normal range of motion. He exhibits no edema or deformity.  Neurological: He is alert and oriented to person, place, and time. GCS score is 15.  Skin: Skin is warm and dry. No rash noted. He is not diaphoretic.  Psychiatric: Mood normal. He has a flat affect.  Nursing [name] and vitals reviewed.   Results for orders placed or performed during the hospital encounter of 05/02/16 (from the past 48 hour(s))  Lipase, blood     Status: None   Collection Time: 05/02/16  7:03 AM  Result Value Ref Range   Lipase 19 11 - 51 U/L  Comprehensive metabolic panel     Status: Abnormal   Collection Time: 05/02/16  7:03 AM  Result Value Ref Range   Sodium 137 135 - 145 mmol/L   Potassium 3.8 3.5 - 5.1 mmol/L   Chloride 99 (L) 101 - 111 mmol/L   CO2 28 22 - 32 mmol/L   Glucose, Bld 119 (H) 65 - 99 mg/dL   BUN 12 6 - 20 mg/dL   Creatinine, Ser 1.03 0.61 - 1.24 mg/dL   Calcium 9.4 8.9 - 10.3 mg/dL   Total Protein 7.4 6.5 - 8.1 g/dL   Albumin 4.6 3.5 - 5.0 g/dL   AST 44 (H) 15 - 41 U/L   ALT 86 (H) 17 - 63 U/L   Alkaline Phosphatase 112 38 - 126 U/L   Total Bilirubin 0.9 0.3 - 1.2 mg/dL   GFR calc non Af Amer >60 >60 mL/min   GFR calc Af Amer >60 >60 mL/min    Comment: ([name]) The eGFR has been calculated using the  CKD EPI equation. This calculation has not been validated in all clinical situations. eGFR's persistently <60 mL/min signify possible Chronic Kidney Disease.    Anion gap 10 5 - 15  CBC     Status: Abnormal   Collection Time: 05/02/16  7:03 AM  Result Value Ref Range   WBC 13.3 (H) 4.0 - 10.5 K/uL   RBC 5.09 4.22 - 5.81 MIL/uL   Hemoglobin 15.4 13.0 - 17.0 g/dL   HCT 44.7 39.0 - 52.0 %   MCV 87.8 78.0 - 100.0 fL   MCH 30.3 26.0 - 34.0 pg   MCHC 34.5 30.0 -  36.0 g/dL   RDW 12.6 11.5 - 15.5 %   Platelets 268 150 - 400 K/uL  Urinalysis, Routine w reflex microscopic     Status: Abnormal   Collection Time: 05/02/16  8:38 AM  Result Value Ref Range   Color, Urine STRAW (A) YELLOW   APPearance CLEAR CLEAR   Specific Gravity, Urine 1.008 1.005 - 1.030   pH 6.0 5.0 - 8.0   Glucose, UA NEGATIVE NEGATIVE mg/dL   Hgb urine dipstick NEGATIVE NEGATIVE   Bilirubin Urine NEGATIVE NEGATIVE   Ketones, ur NEGATIVE NEGATIVE mg/dL   Protein, ur NEGATIVE NEGATIVE mg/dL   Nitrite NEGATIVE NEGATIVE   Leukocytes, UA NEGATIVE NEGATIVE   US Abdomen Limited  Result Date: 05/02/2016 CLINICAL DATA:  Right upper quadrant pain EXAM: US ABDOMEN LIMITED - RIGHT UPPER QUADRANT COMPARISON:  CT 01/17/2015.  Ultrasound 04/06/2013. FINDINGS: Gallbladder: Multiple gallstones dependent in the gallbladder, largest 1.2 cm. No Murphy sign. Borderline wall thickening. Common bile duct: Diameter: 4 mm, normal Liver: Normal except for a 1.5 cm echogenic focus in the right lobe consistent with a small hemangioma. IMPRESSION: Chololithiasis. Largest stone 1.2 cm. Mild gallbladder wall thickening. No Murphy sign however. Electronically Signed   By: Nelson Chimes M.D.   On: 05/02/2016 08:55      Assessment/Plan  acute cholecystitis  Admit to our service  FEN: NPO ID: [email or web address]  Plan: Will likely take pt to the OR today for cholecystectomy.   Kalman Drape, [facility] 05/02/2016, 10:20 AM Pager:  [phone] Consults: [phone] Mon-Fri 7:00 am-4:30 pm Sat-Sun 7:00 am-11:30 am

## 2016-05-02 NOTE — Anesthesia Procedure Notes (Signed)
Procedure Name: Intubation Date/Time: 05/02/2016 1:16 PM Performed by: Ollen Bowl Pre-anesthesia Checklist: Patient identified, Emergency Drugs available, Suction available, Patient being monitored and Timeout performed Patient Re-evaluated:Patient Re-evaluated prior to inductionOxygen Delivery Method: Circle system utilized and Simple face mask Preoxygenation: Pre-oxygenation with 100% oxygen Intubation Type: IV induction Ventilation: Mask ventilation without difficulty Laryngoscope Size: Miller and 3 Grade View: Grade II Tube type: Oral Tube size: 7.5 mm Number of attempts: 1 Airway Equipment and Method: Patient positioned with wedge pillow and Stylet Placement Confirmation: ETT inserted through vocal cords under direct vision,  positive ETCO2 and breath sounds checked- equal and bilateral Secured at: 22 cm Tube secured with: Tape Dental Injury: Teeth and Oropharynx as per pre-operative assessment

## 2016-05-02 NOTE — ED Triage Notes (Signed)
Pt reports diffuse abdominal pain with referred lower back pain. Endorses some nausea, denies vomiting or diarrhea.

## 2016-05-02 NOTE — ED Provider Notes (Signed)
[facility] DEPT Provider Note   CSN: [id number] Arrival date & time: 05/02/16  6294     History   Chief Complaint Chief Complaint  Patient presents with  . Abdominal Pain    HPI CORDAE MCCAREY III is a 46 y.o. male.  HPI Patient presents to the emergency department with upper abdominal pain that started this morning.  Patient states he also had a similar episode this past Thursday and he said last December, had pancreatitis.  Patient states that nothing seems to make the condition better.  Patient states nausea but no vomiting.  Patient states that he did not take any medications prior to arrival. The patient denies chest pain, shortness of breath, headache,blurred vision, neck pain, fever, cough, weakness, numbness, dizziness, anorexia, edema,  vomiting, diarrhea, rash, back pain, dysuria, hematemesis, bloody stool, near syncope, or syncope. Past Medical History:  Diagnosis Date  . Barrett esophagus   . BPH (benign prostatic hypertrophy)   . Depression   . GERD (gastroesophageal reflux disease)   . H/O hiatal hernia   . History of cardiac murmur as a child   . History of cardiac murmur as a child   . History of kidney stones   . Hx of chronic cholecystitis    w/ stone  . Hypertension   . Migraine   . OSA on CPAP     Patient Active Problem List   Diagnosis Date Noted  . Symptomatic cholelithiasis 05/02/2016  . Acute cholecystitis 05/02/2016  . 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/06/2013  . Lap Nissen over 53 Fr with single suture closure of diaphragm 04/05/2013  . Right inguinal hernia 04/05/2013  . GERD (gastroesophageal reflux disease) 01/25/2013  . 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  . Barrett esophagus 08/30/2012  . OSA (obstructive sleep apnea) 08/30/2012    Past Surgical History:  Procedure Laterality Date  . APPENDECTOMY  1999  . ESOPHAGEAL MANOMETRY N/A 11/12/2012   Procedure: ESOPHAGEAL MANOMETRY (EM);  Surgeon: Beryle Beams, MD;  Location: [facility];  Service: Endoscopy;  Laterality: N/A;  . KNEE ARTHROSCOPY Right 04/18/2014   Procedure: RIGHT ARTHROSCOPY KNEE / PLICA EXCISION;  Surgeon: Johnn Hai, MD;  Location: [facility];  Service: Orthopedics;  Laterality: Right;  . LAPAROSCOPIC NISSEN FUNDOPLICATION N/A 7/65/4650   Procedure: LAPAROSCOPIC NISSEN FUNDOPLICATION;  Surgeon: Pedro Earls, MD;  Location: [facility];  Service: General;  Laterality: N/A;  . NASAL SEPTUM SURGERY  2002  . UPPER GI ENDOSCOPY  2013       Home Medications    Prior to Admission medications   Medication Sig Start Date End Date Taking? Authorizing Provider  albuterol (PROVENTIL HFA;VENTOLIN HFA) 108 (90 BASE) MCG/ACT inhaler Inhale 1-2 puffs into the lungs every 6 (six) hours as needed for wheezing or shortness of breath.    Historical Provider, MD  baclofen (LIORESAL) 10 MG tablet Take 10-20 mg by mouth 2 (two) times daily as needed (for muscle spasms).     Historical Provider, MD  Diclofenac Potassium (CAMBIA) 50 MG PACK Take 50 mg by mouth 2 (two) times daily as needed. Migraines    Historical Provider, MD  esomeprazole (NEXIUM) 40 MG capsule Take 40 mg by mouth daily before breakfast.    Historical Provider, MD  lamoTRIgine (LAMICTAL) 200 MG tablet Take 200 mg by mouth every evening.     Historical Provider, MD  lisinopril (PRINIVIL,ZESTRIL) 10 MG tablet Take 10 mg by mouth every morning.    Historical Provider, MD  ondansetron [facility])  4 MG tablet Take 1 tablet (4 mg total) by mouth every 6 (six) hours. 01/17/15   Comer Locket, PA-C  oxyCODONE-acetaminophen (PERCOCET/ROXICET) 5-325 MG tablet Take 1-2 tablets by mouth every 4 (four) hours as needed for severe pain. 01/17/15   Comer Locket, PA-C    Family History Family History  Problem Relation Age of Onset  . Cancer Father     skin  . Diabetes Father   . Cancer Mother     pancreatic 2010  . Hypertension Mother   . Cancer Paternal Aunt     breast    Social  History Social History  Substance Use Topics  . Smoking status: Never Smoker  . Smokeless tobacco: Never Used  . Alcohol use No     Allergies   Doxycycline and Nitroglycerin   Review of Systems Review of Systems All other systems negative except as documented in the HPI. All pertinent positives and negatives as reviewed in the HPI.  Physical Exam Updated Vital Signs BP (!) 129/95 (BP Location: Left Arm)   Pulse 64   Temp 97.5 F (36.4 C)   Resp 10   SpO2 99%   Physical Exam  Constitutional: He is oriented to person, place, and time. He appears well-developed and well-nourished. No distress.  HENT:  Head: Normocephalic and atraumatic.  Mouth/Throat: Oropharynx is clear and moist.  Eyes: Pupils are equal, round, and reactive to light.  Neck: Normal range of motion. Neck supple.  Cardiovascular: Normal rate, regular rhythm and normal heart sounds.  Exam reveals no gallop and no friction rub.   No murmur heard. Pulmonary/Chest: Effort normal and breath sounds normal. No respiratory distress. He has no wheezes.  Abdominal: Soft. Bowel sounds are normal. He exhibits no distension and no mass. There is tenderness in the right upper quadrant and epigastric area. There is no rebound and no guarding.  Neurological: He is alert and oriented to person, place, and time. He exhibits normal muscle tone. Coordination normal.  Skin: Skin is warm and dry. Capillary refill takes less than 2 seconds. No rash noted. No erythema.  Psychiatric: He has a normal mood and affect. His behavior is normal.  Nursing note and vitals reviewed.    ED Treatments / Results  Labs (all labs ordered are listed, but only abnormal results are displayed) Labs Reviewed  COMPREHENSIVE METABOLIC PANEL - Abnormal; Notable for the following:       Result Value   Chloride 99 (*)    Glucose, Bld 119 (*)    AST 44 (*)    ALT 86 (*)    All other components within normal limits  CBC - Abnormal; Notable for the  following:    WBC 13.3 (*)    All other components within normal limits  URINALYSIS, ROUTINE W REFLEX MICROSCOPIC - Abnormal; Notable for the following:    Color, Urine STRAW (*)    All other components within normal limits  LIPASE, BLOOD  SURGICAL PATHOLOGY    EKG  EKG Interpretation None       Radiology Dg Cholangiogram Operative  Result Date: 05/02/2016 CLINICAL DATA:  Acute cholecystitis. EXAM: INTRAOPERATIVE CHOLANGIOGRAM TECHNIQUE: Cholangiographic images from the C-arm fluoroscopic device were submitted for interpretation post-operatively. Please see the procedural report for the amount of contrast and the fluoroscopy time utilized. COMPARISON:  CT 01/17/2015 FINDINGS: Opacification of the cystic duct, common bile duct and common hepatic duct. Minor filling of the intrahepatic biliary system. Contrast rapidly drains into the duodenum. Biliary system is  non dilated. No large filling defects or stones. IMPRESSION: Normal intraoperative cholangiogram. Electronically Signed   By: Markus Daft M.D.   On: 05/02/2016 14:16   US Abdomen Limited  Result Date: 05/02/2016 CLINICAL DATA:  Right upper quadrant pain EXAM: US ABDOMEN LIMITED - RIGHT UPPER QUADRANT COMPARISON:  CT 01/17/2015.  Ultrasound 04/06/2013. FINDINGS: Gallbladder: Multiple gallstones dependent in the gallbladder, largest 1.2 cm. No Murphy sign. Borderline wall thickening. Common bile duct: Diameter: 4 mm, normal Liver: Normal except for a 1.5 cm echogenic focus in the right lobe consistent with a small hemangioma. IMPRESSION: Chololithiasis. Largest stone 1.2 cm. Mild gallbladder wall thickening. No Murphy sign however. Electronically Signed   By: Nelson Chimes M.D.   On: 05/02/2016 08:55    Procedures Procedures (including critical care time)  Medications Ordered in ED Medications  lactated ringers infusion ( Intravenous Anesthesia Volume Adjustment 05/02/16 1428)  HYDROmorphone (DILAUDID) injection 0.25-0.5 mg (0.5 mg  Intravenous Given 05/02/16 1530)  meperidine (DEMEROL) injection 6.25-12.5 mg (not administered)  promethazine (PHENERGAN) injection 6.25-12.5 mg (not administered)  0.9 %  sodium chloride infusion (not administered)  ketorolac (TORADOL) 30 MG/ML injection 30 mg (not administered)    Followed by  ketorolac (TORADOL) 30 MG/ML injection 30 mg (not administered)  HYDROcodone-acetaminophen (NORCO/VICODIN) 5-325 MG per tablet 1-2 tablet (not administered)  acetaminophen (TYLENOL) tablet 650 mg (not administered)    Or  acetaminophen (TYLENOL) suppository 650 mg (not administered)  morphine 2 MG/ML injection 2 mg (not administered)  ondansetron (ZOFRAN-ODT) disintegrating tablet 4 mg (not administered)    Or  ondansetron (ZOFRAN) injection 4 mg (not administered)  simethicone (MYLICON) chewable tablet 40 mg (not administered)  hydrALAZINE (APRESOLINE) injection 10 mg (not administered)  HYDROmorphone (DILAUDID) 1 MG/ML injection (not administered)  ketorolac (TORADOL) 15 MG/ML injection (not administered)  acetaminophen (TYLENOL) 500 MG tablet (not administered)  sodium chloride 0.9 % bolus 1,000 mL (0 mLs Intravenous Stopped 05/02/16 1006)  HYDROmorphone (DILAUDID) injection 1 mg (1 mg Intravenous Given 05/02/16 0750)  ondansetron (ZOFRAN) injection 4 mg (4 mg Intravenous Given 05/02/16 0750)  cefTRIAXone (ROCEPHIN) 2 g in dextrose 5 % 50 mL IVPB ( Intravenous New Bag/Given 05/02/16 1203)  ketorolac (TORADOL) 30 MG/ML injection 30 mg (30 mg Intravenous Given 05/02/16 1533)  acetaminophen (TYLENOL) tablet 1,000 mg (1,000 mg Oral Given 05/02/16 1532)     Initial Impression / Assessment and Plan / ED Course  I have reviewed the triage vital signs and the nursing notes.  Pertinent labs & imaging results that were available during my care of the patient were reviewed by me and considered in my medical decision making (see chart for details).     I spoke with general surgery who will evaluate the  patient.  Based on the fact that the patient is at multiple episodes of this in the past along with his abnormal ultrasound findings and elevated white blood cell count along with hepatic functions. patient is advised of the plan and all questions were answered  Final Clinical Impressions(s) / ED Diagnoses   Final diagnoses:  Cholecystitis    New Prescriptions Current Discharge Medication List       Dalia Heading, PA-C 05/02/16 [place], MD 05/04/16 [phone]

## 2016-05-02 NOTE — ED Notes (Signed)
Patient taken to u/s

## 2016-05-02 NOTE — Progress Notes (Signed)
Received pt from PACU, A&O x4. Walked to the bathroom and voided right after coming to the room. Abd lap sites with skin adhesives dry and intact. Pain meds given.

## 2016-05-02 NOTE — Op Note (Signed)
PATIENT:  John [name]  46 y.o. male  PRE-OPERATIVE DIAGNOSIS:  ACUTE CHOLECYSTITIS  POST-OPERATIVE DIAGNOSIS:  ACUTE CHOLECYSTITIS  PROCEDURE:  Procedure(s): LAPAROSCOPIC CHOLECYSTECTOMY WITH INTRAOPERATIVE CHOLANGIOGRAM   SURGEON:  Surgeon(s): Mickeal Skinner, MD  ASSISTANT: Ivin Booty, RNFA  ANESTHESIA:   local and general  Indications for procedure: John [name] is a 46 y.o. male with symptoms of Abdominal pain and Nausea and vomiting consistent with gallbladder disease, Confirmed by Ultrasound.  Description of procedure: The patient was brought into the operative suite, placed supine. Anesthesia was administered with endotracheal tube. Patient was strapped in place and foot board was secured. All pressure points were offloaded by foam padding. The patient was prepped and draped in the usual sterile fashion.  A small incision was made to the right of the umbilicus. A 57mm trocar was inserted into the peritoneal cavity with optical entry. Pneumoperitoneum was applied with high flow low pressure. 2 30mm trocars were placed in the RUQ. A 45mm trocar was placed in the subxiphoid space. All trocars sites were first anesthesized with 0.25% marcaine with epinephrine in the subcutaneous and preperitoneal layers. Next the patient was placed in reverse trendelenberg. There were multiple adhesions of the omentum to the gallbladder. These were carefully taken down with cautery. The gallbladder had a fatty infiltration throughout.  The gallbladder was retracted cephalad and lateral. The peritoneum was reflected off the infundibulum working lateral to medial. The cystic duct and cystic artery were identified and further dissection revealed a critical view, due to concern for choledocholithiasis a cholangiogram was performed with [place]. Anatomy was normal. The cystic duct and cystic artery were doubly clipped and ligated.   The gallbladder was removed off the liver bed with  cautery. The Gallbladder was placed in a specimen bag. The gallbladder fossa was irrigated and hemostasis was applied with cautery. The gallbladder wall was entered in this maneuver and golden bile spilled out, it was all removed via suction device. The gallbladder was removed via the 37mm trocar. The fascial defect was closed with interrupted 0 vicryl suture via laparoscopic trans-fascial suture passer. Pneumoperitoneum was removed, all trocar were removed. All incisions were closed with 4-0 monocryl subcuticular stitch. The patient woke from anesthesia and was brought to PACU in stable condition. All counts were correct  Findings: inflamed gallbladder  Specimen: gallbladder  Blood loss: Total I/O In: 1999 [I.V.:1000; IV Piggyback:999] Out: -  ml  Local anesthesia: 18 ml 5.9% marcaine  Complications: none  PLAN OF CARE: Admit for overnight observation  PATIENT DISPOSITION:  PACU - hemodynamically stable.  Gurney Maxin, M.D. General, Bariatric, & Minimally Invasive Surgery [facility]

## 2016-05-02 NOTE — Anesthesia Preprocedure Evaluation (Signed)
Anesthesia Evaluation  Patient identified by MRN, date of birth, ID band Patient awake    Airway Mallampati: II       Dental no notable dental hx.    Pulmonary    Pulmonary exam normal        Cardiovascular hypertension, Pt. on medications Normal cardiovascular exam     Neuro/Psych    GI/Hepatic Neg liver ROS, GERD  Medicated and Controlled,  Endo/Other  negative endocrine ROS  Renal/GU negative Renal ROS     Musculoskeletal   Abdominal Normal abdominal exam  (+)   Peds  Hematology negative hematology ROS (+)   Anesthesia Other Findings   Reproductive/Obstetrics                             Anesthesia Physical Anesthesia Plan  ASA: II  Anesthesia Plan:    Post-op Pain Management:    Induction: Intravenous  Airway Management Planned: Oral ETT  Additional Equipment:   Intra-op Plan:   Post-operative Plan: Extubation in OR  Informed Consent: I have reviewed the patients History and Physical, chart, labs and discussed the procedure including the risks, benefits and alternatives for the proposed anesthesia with the patient or authorized representative who has indicated his/her understanding and acceptance.     Plan Discussed with: CRNA and Surgeon  Anesthesia Plan Comments:         Anesthesia Quick Evaluation

## 2016-05-03 ENCOUNTER — Encounter (HOSPITAL_COMMUNITY): Payer: Self-pay | Admitting: General Surgery

## 2016-05-03 MED ORDER — HYDROCODONE-ACETAMINOPHEN 5-325 MG PO TABS: 1.0000 | ORAL_TABLET | ORAL | 0 refills | Status: DC | PRN

## 2016-05-03 NOTE — Discharge Instructions (Signed)
Please arrive at least 30 min before your appointment to complete your check in paperwork.  If you are unable to arrive 30 min prior to your appointment time we may have to cancel or reschedule you.  LAPAROSCOPIC SURGERY: POST OP INSTRUCTIONS  1. DIET: Follow a light bland diet the first 24 hours after arrival home, such as soup, liquids, crackers, etc. Be sure to include lots of fluids daily. Avoid fast food or heavy meals as your are more likely to get nauseated. Eat a low fat the next few days after surgery.  2. Take your usually prescribed home medications unless otherwise directed. 3. PAIN CONTROL:  1. Pain is best controlled by a usual combination of three different methods TOGETHER:  1. Ice/Heat 2. Over the counter pain medication 3. Prescription pain medication 2. Most patients will experience some swelling and bruising around the incisions. Ice packs or heating pads (30-60 minutes up to 6 times a day) will help. Use ice for the first few days to help decrease swelling and bruising, then switch to heat to help relax tight/sore spots and speed recovery. Some people prefer to use ice alone, heat alone, alternating between ice & heat. Experiment to what works for you. Swelling and bruising can take several weeks to resolve.  3. It is helpful to take an over-the-counter pain medication regularly for the first few weeks. Choose one of the following that works best for you:  1. Naproxen (Aleve, etc) Two 220mg  tabs twice a day 2. Ibuprofen (Advil, etc) Three 200mg  tabs four times a day (every meal & bedtime) 3. Acetaminophen (Tylenol, etc) 500-650mg  four times a day (every meal & bedtime) 4. A prescription for pain medication (such as oxycodone, hydrocodone, etc) should be given to you upon discharge. Take your pain medication as prescribed.  1. If you are having problems/concerns with the prescription medicine (does not control pain, nausea, vomiting, rash, itching, etc), please call us [phone]  [phone] to see if we need to switch you to a different pain medicine that will work better for you and/or control your side effect better. 2. If you need a refill on your pain medication, please contact your pharmacy. They will contact our office to request authorization. Prescriptions will not be filled after 5 pm or on week-ends. 4. Avoid getting constipated. Between the surgery and the pain medications, it is common to experience some constipation. Increasing fluid intake and taking a fiber supplement (such as Metamucil, Citrucel, FiberCon, MiraLax, etc) 1-2 times a day regularly will usually help prevent this problem from occurring. A mild laxative (prune juice, Milk of Magnesia, MiraLax, etc) should be taken according to package directions if there are no bowel movements after 48 hours.  5. Watch out for diarrhea. If you have many loose bowel movements, simplify your diet to bland foods & liquids for a few days. Stop any stool softeners and decrease your fiber supplement. Switching to mild anti-diarrheal medications (Kayopectate, Pepto Bismol) can help. If this worsens or does not improve, please call us. 6. Wash / shower every day. You may shower over the dressings as they are waterproof. Continue to shower over incision(s) after the dressing is off. 7. Remove your waterproof bandages 5 days after surgery. You may leave the incision open to air. You may replace a dressing/Band-Aid to cover the incision for comfort if you wish.  8. ACTIVITIES as tolerated:  5. You may resume regular (light) daily activities beginning the next day--such as daily self-care, walking, climbing stairs--gradually  increasing activities as tolerated. If you can walk 30 minutes without difficulty, it is safe to try more intense activity such as jogging, treadmill, bicycling, low-impact aerobics, swimming, etc. 1. Refrain from any heavy lifting (>15lbs) or straining for 4-6 weeks 2. DO NOT PUSH THROUGH PAIN. Let pain be your  guide: If it hurts to do something, don't do it. Pain is your body warning you to avoid that activity for another week until the pain goes down. 3. You may drive when you are no longer taking prescription pain medication, you can comfortably wear a seatbelt, and you can safely maneuver your car and apply brakes. 4. You may have sexual intercourse when it is comfortable.  9. FOLLOW UP in our office  1. Please call [facility] at [phone] to set up an appointment to see your surgeon in the office for a follow-up appointment approximately 2-3 weeks after your surgery. 2. Make sure that you call for this appointment the day you arrive home to insure a convenient appointment time.      10. IF YOU HAVE DISABILITY OR FAMILY LEAVE FORMS, BRING THEM TO THE               OFFICE FOR PROCESSING.   WHEN TO CALL us [phone]:  1. Poor pain control 2. Reactions / problems with new medications (rash/itching, nausea, etc)  3. Fever over 101.5 F (38.5 C) 4. Inability to urinate 5. Nausea and/or vomiting 6. Worsening swelling or bruising 7. Continued bleeding from incision. 8. Increased pain, redness, or drainage from the incision  The clinic staff is available to answer your questions during regular business hours (8:30am-5pm). Please dont hesitate to call and ask to speak to one of our nurses for clinical concerns.  If you have a medical emergency, go to the nearest emergency room or call 911.  A surgeon from [facility] is always on call at the [facility], [place], [place], [place], [place] [address] ?  MAIN: [phone] ? TOLL FREE: [phone] ?  FAX [phone]  [email or web address]

## 2016-05-03 NOTE — Anesthesia Postprocedure Evaluation (Addendum)
Anesthesia Post Note  Patient: John [name]  Procedure(s) Performed: Procedure(s) (LRB): LAPAROSCOPIC CHOLECYSTECTOMY WITH INTRAOPERATIVE CHOLANGIOGRAM (N/A)  Patient location during evaluation: PACU Anesthesia Type: General Level of consciousness: awake Pain management: pain level controlled Vital Signs Assessment: post-procedure vital signs reviewed and stable Respiratory status: spontaneous breathing Cardiovascular status: stable Postop Assessment: no signs of nausea or vomiting Anesthetic complications: no        Last Vitals:  Vitals:   05/03/16 0224 05/03/16 0548  BP: 108/66 129/80  Pulse: 71 68  Resp: 16 17  Temp: 36.7 C 36.7 C    Last Pain:  Vitals:   05/03/16 0548  TempSrc: Oral  PainSc:    Pain Goal:                 John [name],John [name]

## 2016-05-03 NOTE — Progress Notes (Signed)
Pt discharged home in stable condition after going over discharge meds and instructions with no concerns voiced.AVS and discharge script given before pt left the unit

## 2016-05-03 NOTE — Discharge Summary (Signed)
[facility] Discharge Summary   Patient ID: John [name] MRN: [id number] DOB/AGE: 05/05/1970 46 y.o.  Admit date: 05/02/2016 Discharge date: 05/03/2016  Admitting Diagnosis: Acute cholecystitis  Discharge Diagnosis Patient Active Problem List   Diagnosis Date Noted  . Symptomatic cholelithiasis 05/02/2016  . Acute cholecystitis 05/02/2016  . 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/06/2013  . 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 Right inguinal hernia 04/05/2013  . GERD (gastroesophageal reflux disease) 01/25/2013  . 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  . Barrett esophagus 08/30/2012  . OSA (obstructive sleep apnea) 08/30/2012    Consultants None   Imaging: Dg Cholangiogram Operative  Result Date: 05/02/2016 CLINICAL DATA:  Acute cholecystitis. EXAM: INTRAOPERATIVE CHOLANGIOGRAM TECHNIQUE: Cholangiographic images from the C-arm fluoroscopic device were submitted for interpretation post-operatively. Please see the procedural report for the amount of contrast and the fluoroscopy time utilized. COMPARISON:  CT 01/17/2015 FINDINGS: Opacification of the cystic duct, common bile duct and common hepatic duct. Minor filling of the intrahepatic biliary system. Contrast rapidly drains into the duodenum. Biliary system is non dilated. No large filling defects or stones. IMPRESSION: Normal intraoperative cholangiogram. Electronically Signed   By: Markus Daft M.D.   On: 05/02/2016 14:16   US Abdomen Limited  Result Date: 05/02/2016 CLINICAL DATA:  Right upper quadrant pain EXAM: US ABDOMEN LIMITED - RIGHT UPPER QUADRANT COMPARISON:  CT 01/17/2015.  Ultrasound 04/06/2013. FINDINGS: Gallbladder: Multiple gallstones dependent in the gallbladder, largest 1.2 cm. No Murphy sign. Borderline wall thickening. Common bile duct: Diameter: 4 mm, normal Liver: Normal except for a 1.5 cm echogenic focus in the right lobe consistent with a small hemangioma.  IMPRESSION: Chololithiasis. Largest stone 1.2 cm. Mild gallbladder wall thickening. No Murphy sign however. Electronically Signed   By: Nelson Chimes M.D.   On: 05/02/2016 08:55    Procedures Dr. Kieth Brightly (05/02/16) - Laparoscopic Cholecystectomy with [place] Hospital Course:  John [name] is 46 year old male who presented to [facility] with abdominal pain.  Workup showed acute cholecystitis.  Patient was admitted and underwent procedure listed above.  Tolerated procedure well and was transferred to the floor.  Diet was advanced as tolerated.  On POD#1, the patient was voiding well, tolerating diet, ambulating well, pain well controlled, vital signs stable, incisions c/d/i and felt stable for discharge home.  Patient will follow up in our office in 2 weeks and knows to call with questions or concerns.  He will call to confirm appointment date/time.    Patient was discharged in good condition.  The [place] Substance controlled database was reviewed prior to prescribing narcotic pain medication to this patient.  Physical Exam: Gen:  Alert, NAD, pleasant, cooperative, well appearing Card:  RRR, no M/G/R heard Pulm:  CTA, no W/R/R, effort normal Abd: Soft, not distended, +BS, incisions C/D/I, mild TTP Skin: not diaphoretic, warm and dry  Allergies as of 05/03/2016      Reactions   Doxycycline Other (See Comments)   "Burning in throat"   Nitroglycerin Other (See Comments)   Severe headache      Medication List    STOP taking these medications   oxyCODONE-acetaminophen 5-325 MG tablet Commonly known as:  PERCOCET/ROXICET     TAKE these medications   albuterol 108 (90 Base) MCG/ACT inhaler Commonly known as:  PROVENTIL HFA;VENTOLIN HFA Inhale 1-2 puffs into the lungs every 6 (six) hours as needed for wheezing or shortness of breath.   baclofen 10 MG tablet Commonly  known as:  LIORESAL Take 10-20 mg by mouth 2 (two) times daily as needed (for muscle spasms).   CAMBIA 50 MG  Pack Generic drug:  Diclofenac Potassium Take 50 mg by mouth 2 (two) times daily as needed. Migraines   esomeprazole 40 MG capsule Commonly known as:  NEXIUM Take 40 mg by mouth daily before breakfast.   HYDROcodone-acetaminophen 5-325 MG tablet Commonly known as:  NORCO/VICODIN Take 1-2 tablets by mouth every 4 (four) hours as needed for moderate pain.   lamoTRIgine 200 MG tablet Commonly known as:  LAMICTAL Take 200 mg by mouth every evening.   lisinopril 10 MG tablet Commonly known as:  PRINIVIL,ZESTRIL Take 10 mg by mouth every morning.   ondansetron 4 MG tablet Commonly known as:  ZOFRAN Take 1 tablet (4 mg total) by mouth every 6 (six) hours.        Follow-up [facility], [place]. Call.   Specialty:  General Surgery Why:  to confirm appointment date and time Contact information: [address] [place] [phone]          Signed: [facility] 05/03/2016, 9:16 AM Pager: [phone] Consults: [phone] Mon-Fri 7:00 am-4:30 pm Sat-Sun 7:00 am-11:30 am

## 2016-05-03 NOTE — Progress Notes (Signed)
[facility] Progress Note  1 Day Post-Op  Subjective: Pt states minimal abdominal pain. No acute events overnight. No fever or chills. He is tolerating clear liquids. + flatus and has been up to urinate.  Objective: Vital signs in last 24 hours: Temp:  [97.5 F (36.4 C)-98.2 F (36.8 C)] 98 F (36.7 C) (03/20 0548) Pulse Rate:  [62-86] 68 (03/20 0548) Resp:  [10-20] 17 (03/20 0548) BP: (108-162)/(66-105) 129/80 (03/20 0548) SpO2:  [90 %-100 %] 93 % (03/20 0548) Weight:  [183 lb 6.8 oz (83.2 kg)] 183 lb 6.8 oz (83.2 kg) (03/19 1650)    Intake/Output from previous day: 03/19 0701 - 03/20 0700 In: 4419 [P.O.:1080; I.V.:2340; IV Piggyback:999] Out: 3600 [Urine:3600] Intake/Output this shift: No intake/output data recorded.  PE: Gen:  Alert, NAD, pleasant, cooperative, well appearing Card:  RRR, no M/G/R heard Pulm:  CTA, no W/R/R, effort normal Abd: Soft, not distended, +BS, incisions C/D/I, mild TTP Skin: not diaphoretic, warm and dry  Lab Results:   Recent Labs  05/02/16 0703  WBC 13.3*  HGB 15.4  HCT 44.7  PLT 268   BMET  Recent Labs  05/02/16 0703  NA 137  K 3.8  CL 99*  CO2 28  GLUCOSE 119*  BUN 12  CREATININE 1.03  CALCIUM 9.4   PT/INR No results for input(s): LABPROT, INR in the last 72 hours. CMP     Component Value Date/Time   NA 137 05/02/2016 0703   NA 141 04/12/2011 1150   K 3.8 05/02/2016 0703   K 4.1 04/12/2011 1150   CL 99 (L) 05/02/2016 0703   CL 105 04/12/2011 1150   CO2 28 05/02/2016 0703   CO2 31 04/12/2011 1150   GLUCOSE 119 (H) 05/02/2016 0703   GLUCOSE 97 04/12/2011 1150   BUN 12 05/02/2016 0703   BUN 13 04/12/2011 1150   CREATININE 1.03 05/02/2016 0703   CREATININE 1.34 09/13/2012 1036   CALCIUM 9.4 05/02/2016 0703   CALCIUM 8.8 04/12/2011 1150   PROT 7.4 05/02/2016 0703   PROT 7.3 04/12/2011 1150   ALBUMIN 4.6 05/02/2016 0703   ALBUMIN 4.1 04/12/2011 1150   AST 44 (H) 05/02/2016 0703   AST 37  04/12/2011 1150   ALT 86 (H) 05/02/2016 0703   ALT 73 04/12/2011 1150   ALKPHOS 112 05/02/2016 0703   ALKPHOS 103 04/12/2011 1150   BILITOT 0.9 05/02/2016 0703   BILITOT 0.5 04/12/2011 1150   GFRNONAA >60 05/02/2016 0703   GFRNONAA >60 04/12/2011 1150   GFRAA >60 05/02/2016 0703   GFRAA >60 04/12/2011 1150   Lipase     Component Value Date/Time   LIPASE 19 05/02/2016 0703       Studies/Results: Dg Cholangiogram Operative  Result Date: 05/02/2016 CLINICAL DATA:  Acute cholecystitis. EXAM: INTRAOPERATIVE CHOLANGIOGRAM TECHNIQUE: Cholangiographic images from the C-arm fluoroscopic device were submitted for interpretation post-operatively. Please see the procedural report for the amount of contrast and the fluoroscopy time utilized. COMPARISON:  CT 01/17/2015 FINDINGS: Opacification of the cystic duct, common bile duct and common hepatic duct. Minor filling of the intrahepatic biliary system. Contrast rapidly drains into the duodenum. Biliary system is non dilated. No large filling defects or stones. IMPRESSION: Normal intraoperative cholangiogram. Electronically Signed   By: Markus Daft M.D.   On: 05/02/2016 14:16   US Abdomen Limited  Result Date: 05/02/2016 CLINICAL DATA:  Right upper quadrant pain EXAM: US ABDOMEN LIMITED - RIGHT UPPER QUADRANT COMPARISON:  CT 01/17/2015.  Ultrasound 04/06/2013. FINDINGS:  Gallbladder: Multiple gallstones dependent in the gallbladder, largest 1.2 cm. No Murphy sign. Borderline wall thickening. Common bile duct: Diameter: 4 mm, normal Liver: Normal except for a 1.5 cm echogenic focus in the right lobe consistent with a small hemangioma. IMPRESSION: Chololithiasis. Largest stone 1.2 cm. Mild gallbladder wall thickening. No Murphy sign however. Electronically Signed   By: Nelson Chimes M.D.   On: 05/02/2016 08:55    Anti-infectives: Anti-infectives    Start     Dose/Rate Route Frequency Ordered Stop   05/02/16 1115  cefTRIAXone (ROCEPHIN) 2 g in dextrose  5 % 50 mL IVPB     2 g 100 mL/hr over 30 Minutes Intravenous  Once 05/02/16 1109 05/02/16 1233       Assessment/Plan  GERD Hx of lap Nissen OSA Hx of appendectomy Chronic migraines  Acute cholecystitis S/P LAPAROSCOPIC CHOLECYSTECTOMY WITH INTRAOPERATIVE CHOLANGIOGRAM, Dr. Kieth Brightly, 05/02/16  FEN: reg diet VTE: SCD's ID: Rocephin once 05/02/16  Plan: DC later this morning if pt tolerated reg diet.   LOS: 0 days    Kalman Drape , [facility] 05/03/2016, 7:30 AM Pager: [phone] Consults: [phone] Mon-Fri 7:00 am-4:30 pm Sat-Sun 7:00 am-11:30 am

## 2016-07-22 NOTE — Addendum Note (Signed)
Addendum  created 07/22/16 1110 by Duy Lemming, MD   Sign clinical note    

## 2016-11-06 IMAGING — DX DG ABDOMEN ACUTE W/ 1V CHEST
3 series · 3 of 3 positions shown · non-contrast
Comparison: Chest x-ray dated 10/16/2012

CLINICAL DATA: IBS, abdominal pain with back pain, diarrhea.

EXAM:
DG ABDOMEN ACUTE W/ 1V CHEST

[abdomen erect]
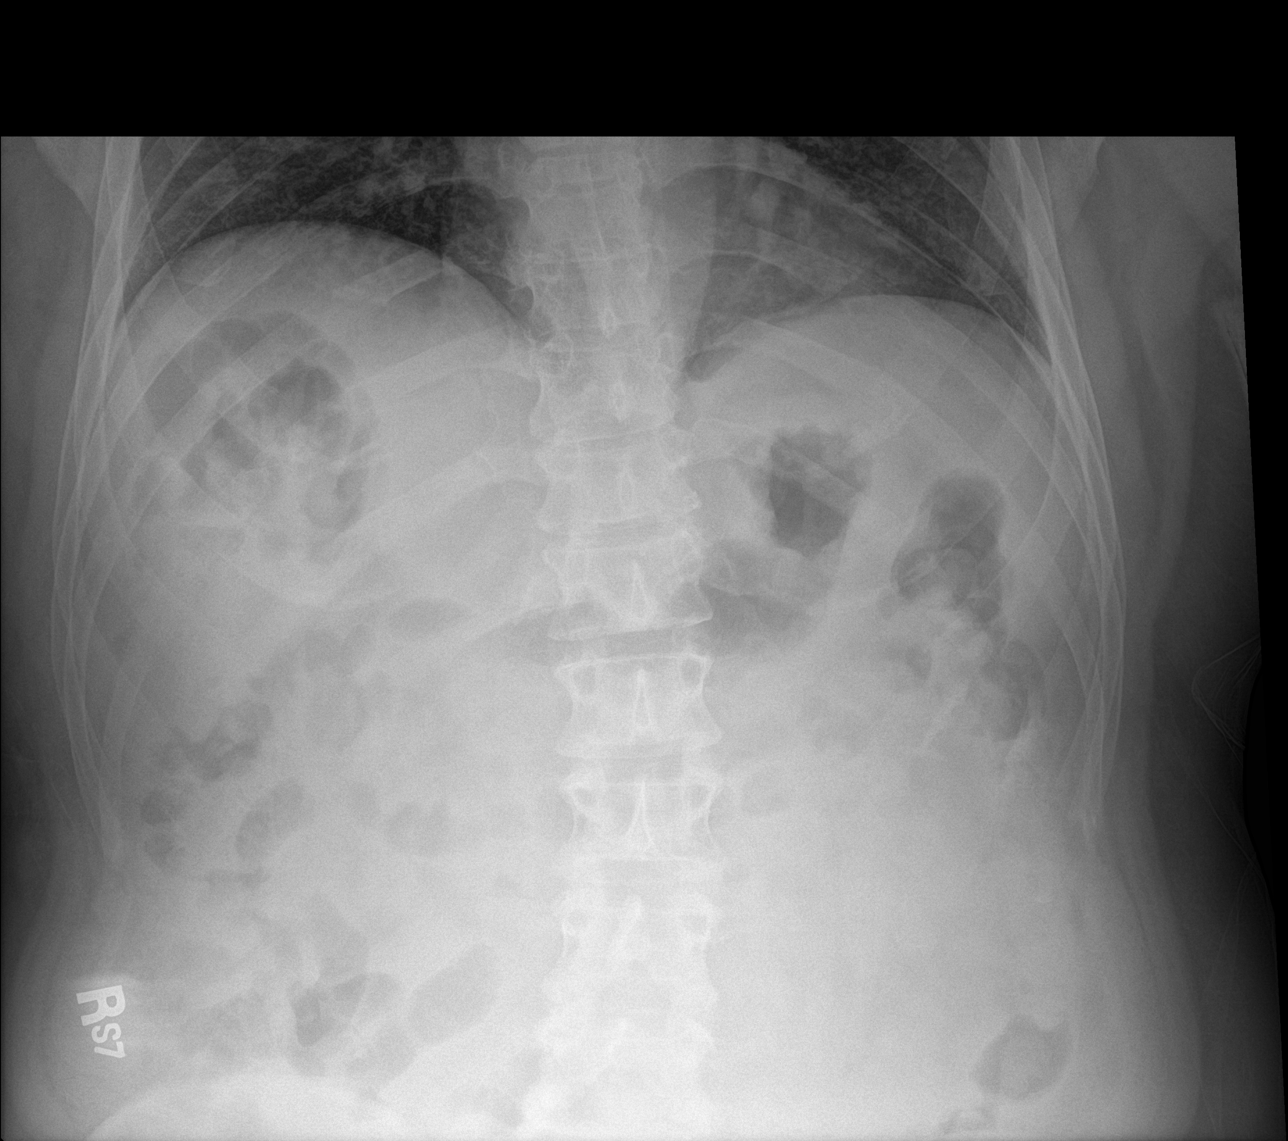

[abdomen supine]
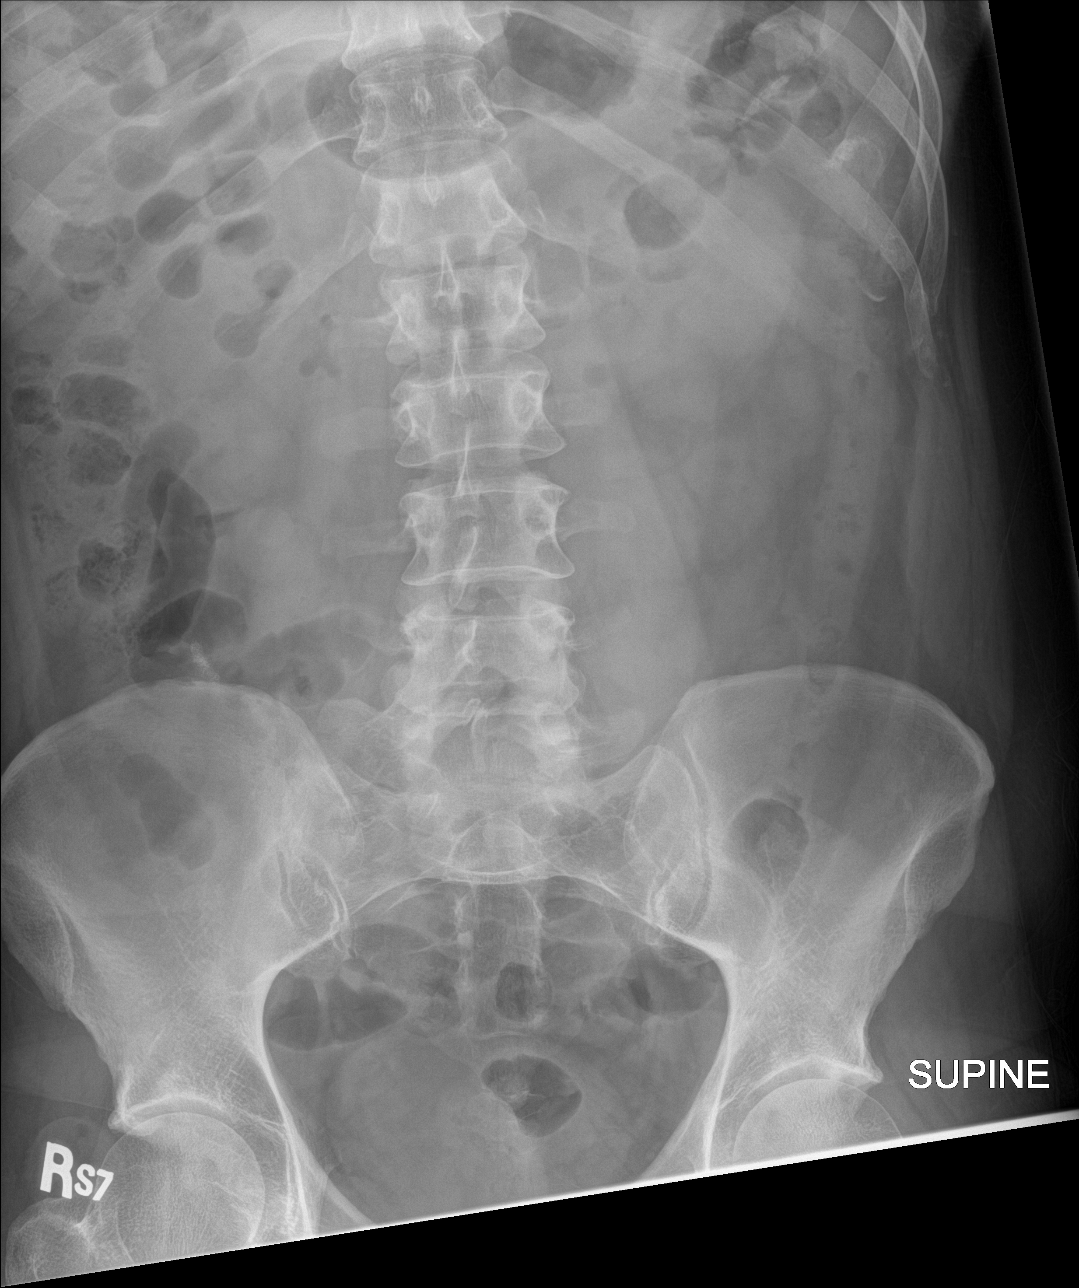

[chest ap]
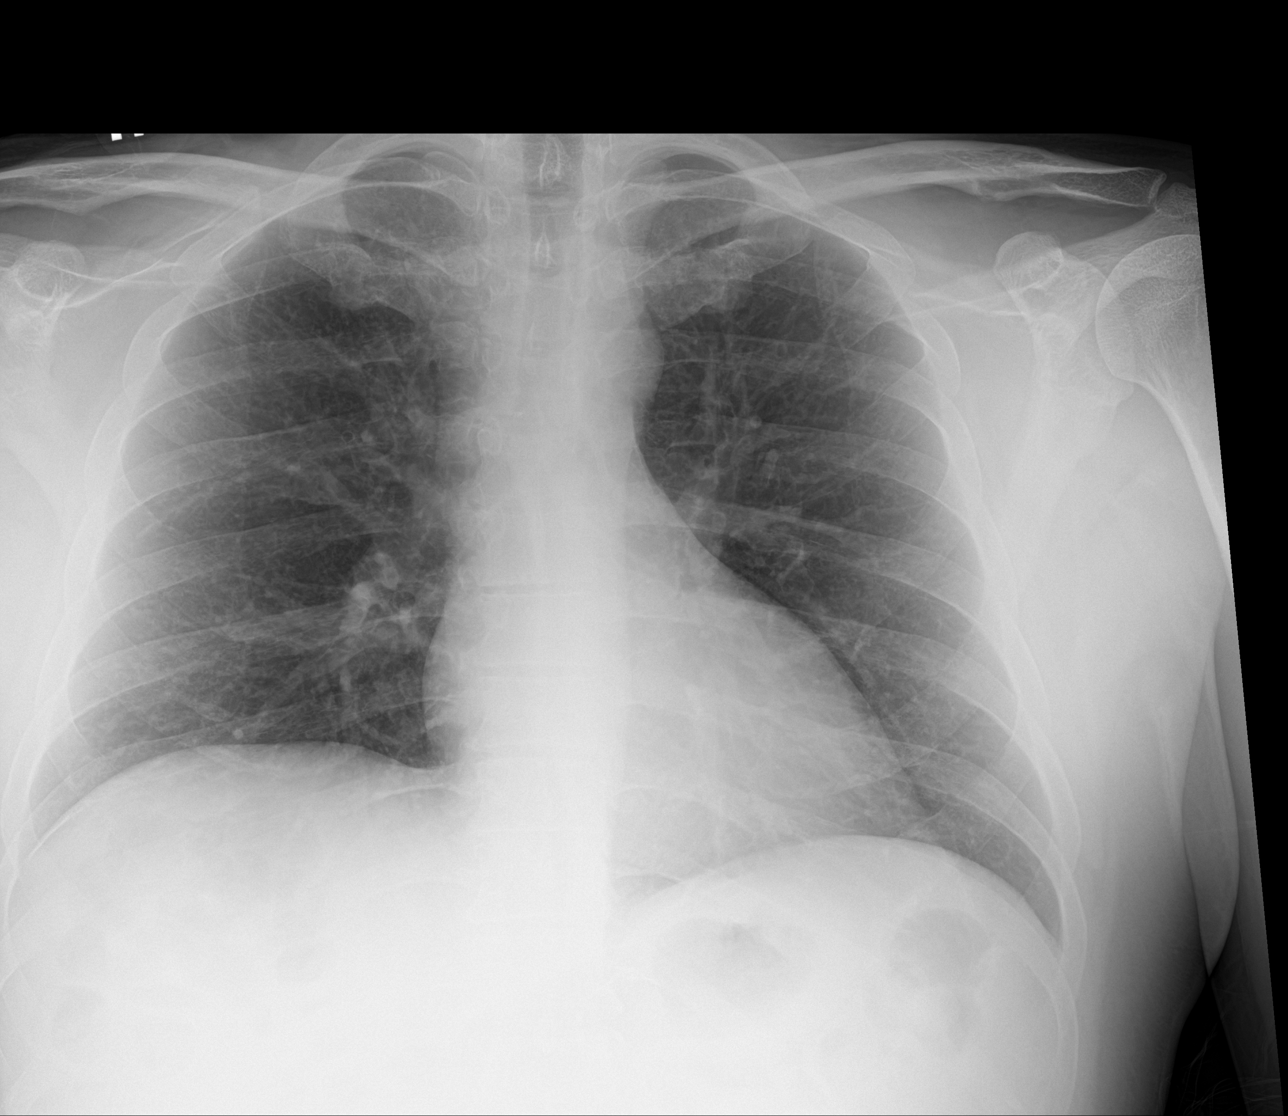

[3 of 3 positions shown; findings below may reference images not displayed]

FINDINGS: Single view of the chest: Cardiomediastinal silhouette remains
normal in size and configuration. Lungs are clear. Lung volumes are
normal. No pleural effusion. No pneumothorax. Slight elevation of
the right hemidiaphragm is unchanged. Osseous structures about the
chest are unremarkable.

Upright and supine views of the abdomen: Bowel gas pattern is
nonobstructive. No evidence of soft tissue mass or abnormal fluid
collection. No evidence of free intraperitoneal air. No pathologic -
appearing calcifications seen. No acute/significant osseous
abnormality.
IMPRESSION: Lungs are clear and there is no evidence of acute cardiopulmonary
abnormality.

Nonobstructive bowel gas pattern and no evidence of acute
intra-abdominal abnormality.

## 2018-04-07 IMAGING — US US ABDOMEN LIMITED
1 series · 14 of 25 positions shown · non-contrast
Comparison: CT 01/17/2015.  Ultrasound 04/06/2013.

CLINICAL DATA: Right upper quadrant pain

EXAM:
US ABDOMEN LIMITED - RIGHT UPPER QUADRANT

[Series 1: us abdomen limited · 0.27mm/px · 14 of 48 slices shown]
[im 1/48]
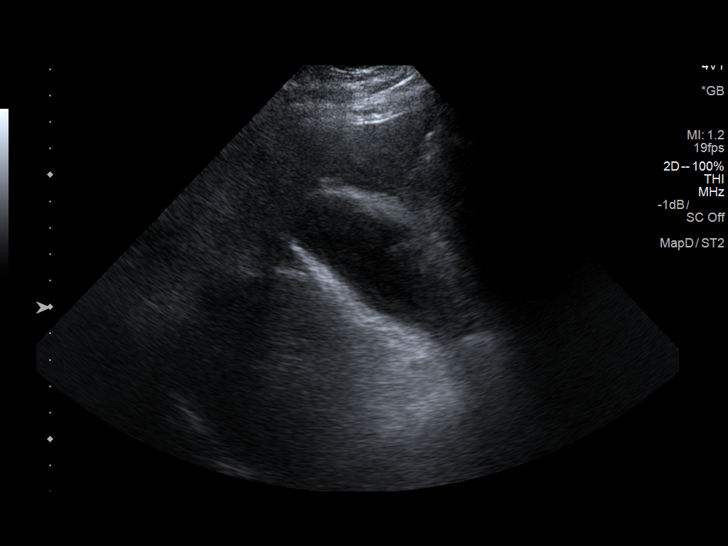
[im 4/48]
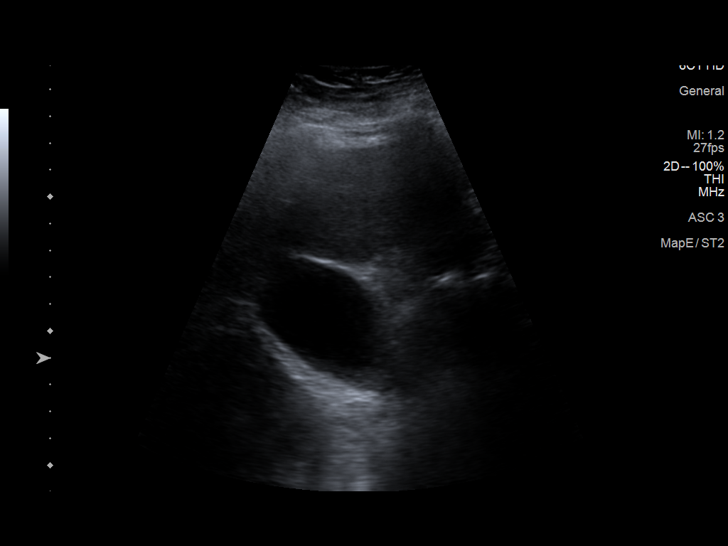
[im 8/48]
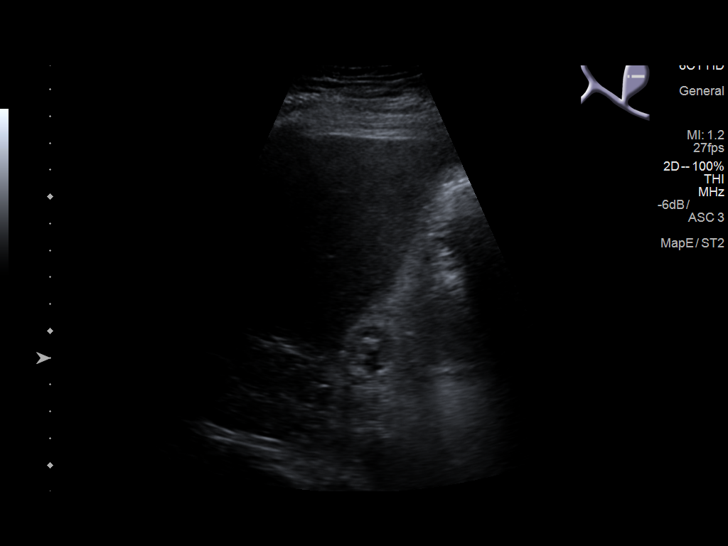
[im 12/48]
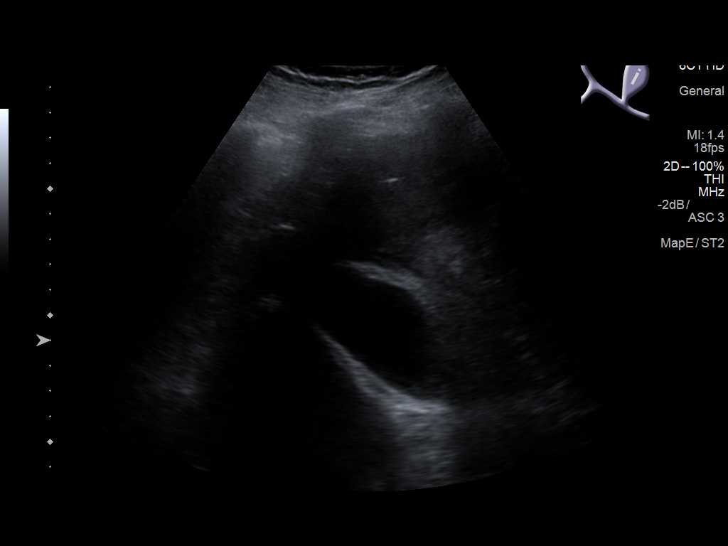
[im 16/48]
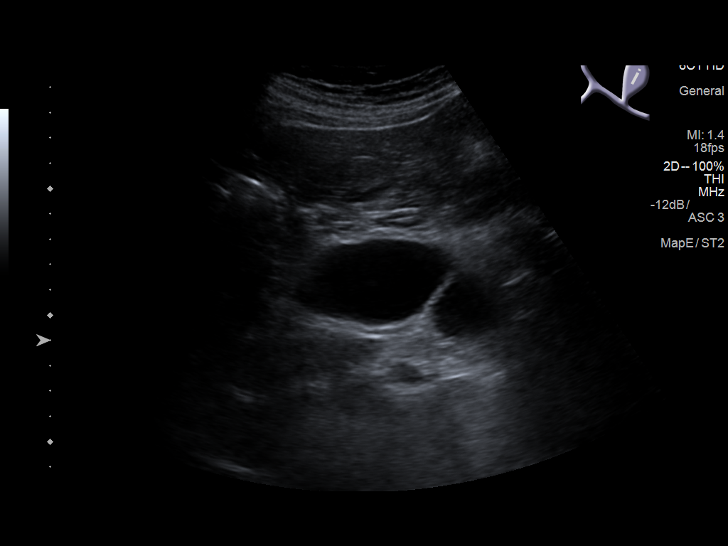
[im 18/48]
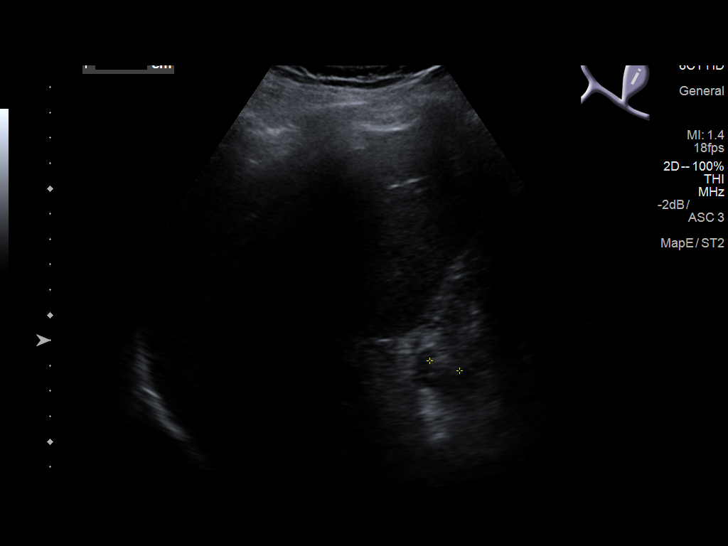
[im 22/48]
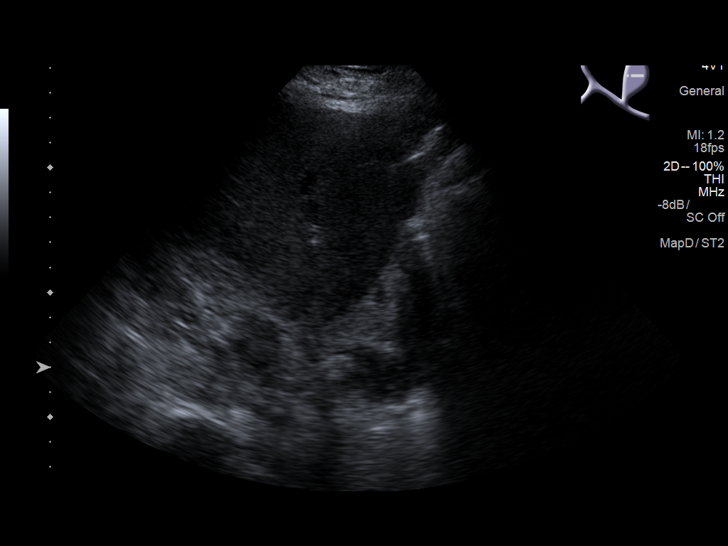
[im 26/48]
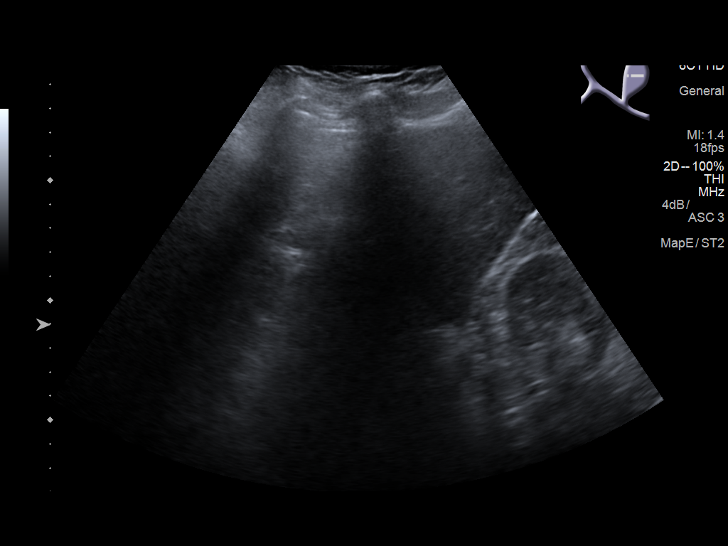
[im 30/48]
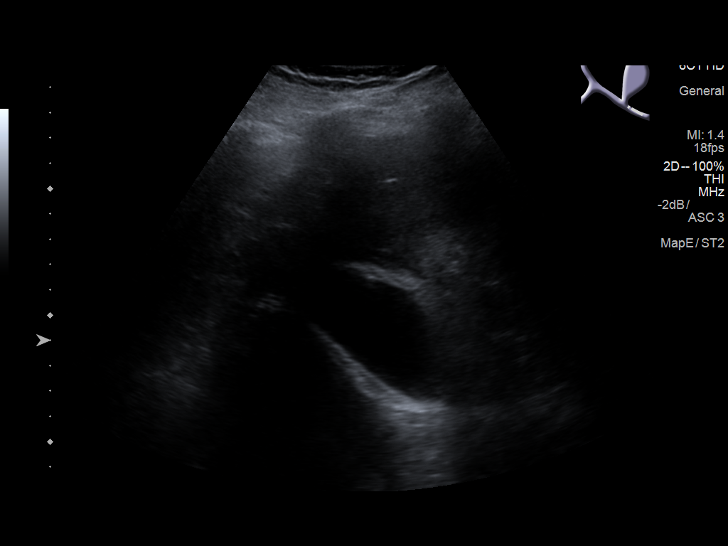
[im 32/48]
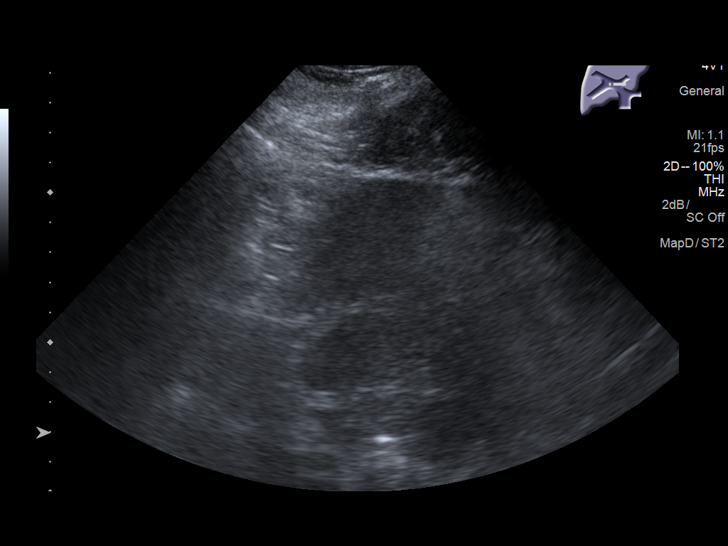
[im 36/48]
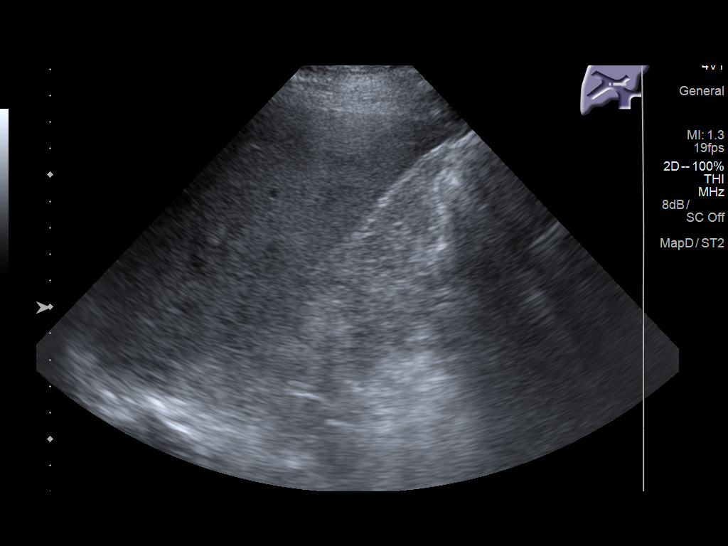
[im 40/48]
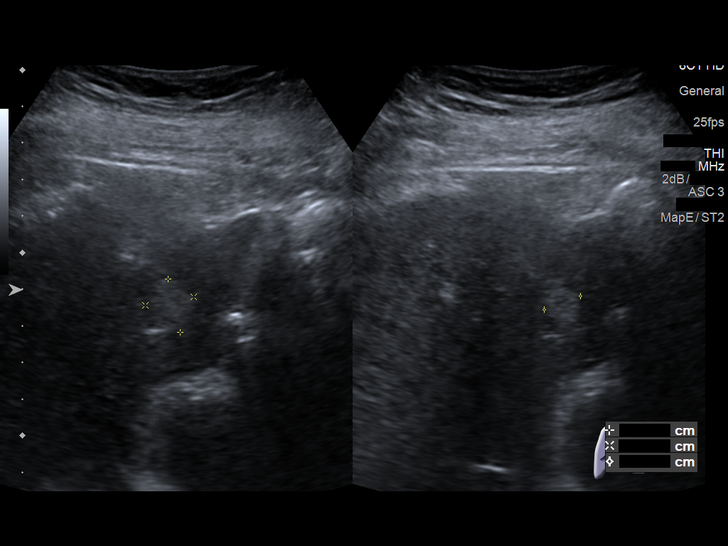
[im 44/48]
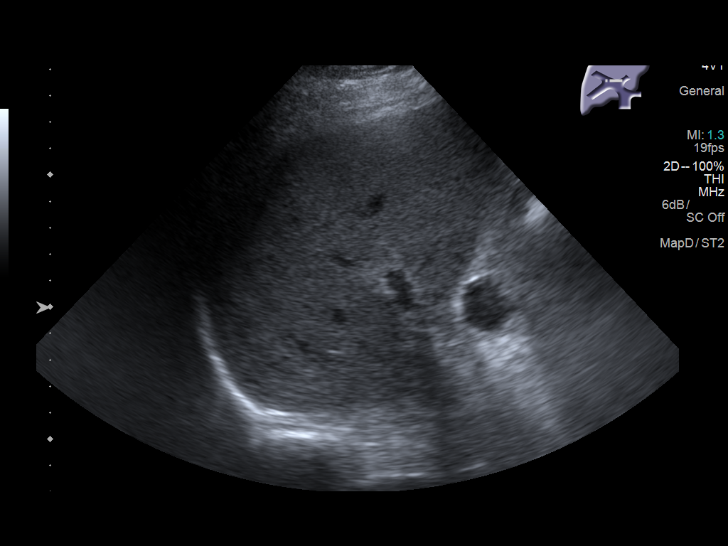
[im 48/48]
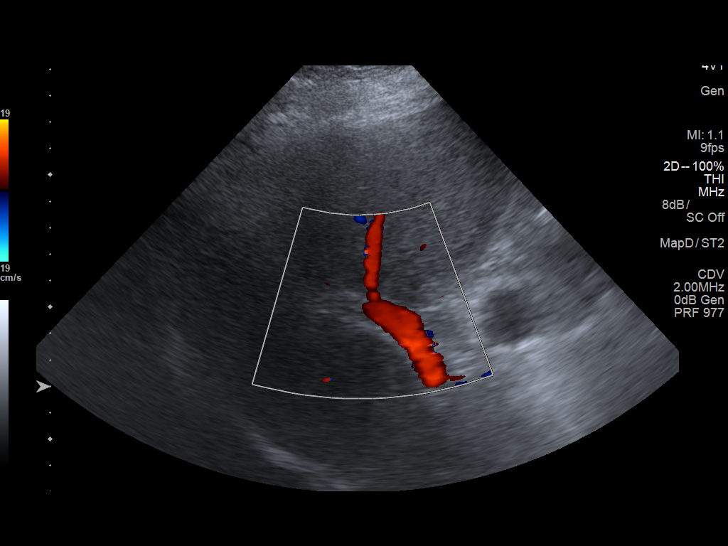

[14 of 25 positions shown; findings below may reference images not displayed]

FINDINGS: Gallbladder:

Multiple gallstones dependent in the gallbladder, largest 1.2 cm. No
Murphy sign. Borderline wall thickening.

Common bile duct:

Diameter: 4 mm, normal

Liver:

Normal except for a 1.5 cm echogenic focus in the right lobe
consistent with a small hemangioma.
IMPRESSION: Chololithiasis. Largest stone 1.2 cm. Mild gallbladder wall
thickening. No Murphy sign however.

## 2020-02-09 ENCOUNTER — Emergency Department: Payer: Medicare Other

## 2020-02-09 ENCOUNTER — Emergency Department
Admission: EM | Admit: 2020-02-09 | Discharge: 2020-02-09 | Disposition: A | Discharge status: Home / Self Care | Payer: Medicare Other | Attending: Emergency Medicine | Admitting: Emergency Medicine

## 2020-02-09 ENCOUNTER — Other Ambulatory Visit: Payer: Self-pay

## 2020-02-09 ENCOUNTER — Encounter: Payer: Self-pay | Admitting: Emergency Medicine

## 2020-02-09 DIAGNOSIS — Z79899 Other long term (current) drug therapy: Secondary | ICD-10-CM | POA: Diagnosis not present

## 2020-02-09 DIAGNOSIS — R918 Other nonspecific abnormal finding of lung field: Secondary | ICD-10-CM | POA: Insufficient documentation

## 2020-02-09 DIAGNOSIS — U071 COVID-19: Secondary | ICD-10-CM | POA: Insufficient documentation

## 2020-02-09 DIAGNOSIS — I10 Essential (primary) hypertension: Secondary | ICD-10-CM | POA: Diagnosis not present

## 2020-02-09 DIAGNOSIS — R0602 Shortness of breath: Secondary | ICD-10-CM | POA: Diagnosis not present

## 2020-02-09 DIAGNOSIS — R509 Fever, unspecified: Secondary | ICD-10-CM | POA: Diagnosis present

## 2020-02-09 LAB — CBC
HCT: 47.3 % (ref 39.0–52.0)
Hemoglobin: 15.7 g/dL (ref 13.0–17.0)
MCH: 29.7 pg (ref 26.0–34.0)
MCHC: 33.2 g/dL (ref 30.0–36.0)
MCV: 89.6 fL (ref 80.0–100.0)
Platelets: 150 10*3/uL (ref 150–400)
RBC: 5.28 MIL/uL (ref 4.22–5.81)
RDW: 12.7 % (ref 11.5–15.5)
WBC: 4.6 10*3/uL (ref 4.0–10.5)
nRBC: 0 % (ref 0.0–0.2)

## 2020-02-09 LAB — BASIC METABOLIC PANEL
Anion gap: 7 (ref 5–15)
BUN: 12 mg/dL (ref 6–20)
CO2: 27 mmol/L (ref 22–32)
Calcium: 8.4 mg/dL — ABNORMAL LOW (ref 8.9–10.3)
Chloride: 102 mmol/L (ref 98–111)
Creatinine, Ser: 1.26 mg/dL — ABNORMAL HIGH (ref 0.61–1.24)
GFR, Estimated: >60 mL/min (ref >60)
Glucose, Bld: 114 mg/dL — ABNORMAL HIGH (ref 70–99)
Potassium: 3.3 mmol/L — ABNORMAL LOW (ref 3.5–5.1)
Sodium: 136 mmol/L (ref 135–145)

## 2020-02-09 LAB — RESP PANEL BY RT-PCR (FLU A&B, COVID) ARPGX2
Influenza A by PCR: NEGATIVE
Influenza B by PCR: NEGATIVE
SARS Coronavirus 2 by RT PCR: POSITIVE — AB

## 2020-02-09 LAB — TROPONIN I (HIGH SENSITIVITY): Troponin I (High Sensitivity): 3 ng/L (ref <18)

## 2020-02-09 MED ORDER — FAMOTIDINE 20 MG PO TABS: 20.0000 mg | ORAL_TABLET | Freq: Two times a day (BID) | ORAL | 0 refills | Status: DC

## 2020-02-09 MED ORDER — NAPROXEN 500 MG PO TABS: 500.0000 mg | ORAL_TABLET | Freq: Two times a day (BID) | ORAL | 0 refills | Status: DC

## 2020-02-09 MED ORDER — ONDANSETRON 4 MG PO TBDP: 4.0000 mg | ORAL_TABLET | Freq: Three times a day (TID) | ORAL | 0 refills | Status: DC | PRN

## 2020-02-09 MED ORDER — AMOXICILLIN-POT CLAVULANATE 875-125 MG PO TABS: 1.0000 | ORAL_TABLET | Freq: Two times a day (BID) | ORAL | 0 refills | Status: DC

## 2020-02-09 MED ORDER — LACTATED RINGERS IV BOLUS
1000.0000 mL | Freq: Once | INTRAVENOUS | Status: AC
  Administered 2020-02-09: 1000 mL via INTRAVENOUS

## 2020-02-09 MED ORDER — IOHEXOL 350 MG/ML SOLN
75.0000 mL | Freq: Once | INTRAVENOUS | Status: AC | PRN
  Administered 2020-02-09: 75 mL via INTRAVENOUS

## 2020-02-09 NOTE — ED Provider Notes (Signed)
[facility] Emergency Department Provider Note  ____________________________________________  Time seen: Approximately 1:16 PM  I have reviewed the triage vital signs and the nursing notes.   HISTORY  Chief Complaint Fever, Nausea, and Chest Pain    HPI John [name] is a 49 y.o. male with a history of GERD, hypertension, OSA who complains of fever chills nausea body aches for the past 2 days.  This is in the setting of also having chest pain and shortness of breath which is dull diffuse  nonradiating and sometimes pleuritic over the past 2 months.  He has completed multiple courses of antibiotics without improvement of symptoms.  Denies exertional symptoms.     Past Medical History:  Diagnosis Date  . Barrett esophagus   . BPH (benign prostatic hypertrophy)   . Depression   . GERD (gastroesophageal reflux disease)   . H/O hiatal hernia   . History of cardiac murmur as a child   . History of cardiac murmur as a child   . History of kidney stones   . Hx of chronic cholecystitis    w/ stone  . Hypertension   . Migraine   . OSA on CPAP      Patient Active Problem List   Diagnosis Date Noted  . Symptomatic cholelithiasis 05/02/2016  . Acute cholecystitis 05/02/2016  . 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/06/2013  . Lap Nissen over 4 Fr with single suture closure of diaphragm 04/05/2013  . Right inguinal hernia 04/05/2013  . GERD (gastroesophageal reflux disease) 01/25/2013  . 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  . Barrett esophagus 08/30/2012  . OSA (obstructive sleep apnea) 08/30/2012     Past Surgical History:  Procedure Laterality Date  . APPENDECTOMY  1999  . CHOLECYSTECTOMY  05/02/2016   LAPROSCOPIC  . CHOLECYSTECTOMY N/A 05/02/2016   Procedure: LAPAROSCOPIC CHOLECYSTECTOMY WITH INTRAOPERATIVE CHOLANGIOGRAM;  Surgeon: Mickeal Skinner, MD;  Location: [place];  Service: General;  Laterality: N/A;  . ESOPHAGEAL MANOMETRY N/A  11/12/2012   Procedure: ESOPHAGEAL MANOMETRY (EM);  Surgeon: Beryle Beams, MD;  Location: [facility];  Service: Endoscopy;  Laterality: N/A;  . KNEE ARTHROSCOPY Right 04/18/2014   Procedure: RIGHT ARTHROSCOPY KNEE / PLICA EXCISION;  Surgeon: Johnn Hai, MD;  Location: [place];  Service: Orthopedics;  Laterality: Right;  . LAPAROSCOPIC NISSEN FUNDOPLICATION N/A [id number]   Procedure: LAPAROSCOPIC NISSEN FUNDOPLICATION;  Surgeon: Pedro Earls, MD;  Location: [facility];  Service: General;  Laterality: N/A;  . NASAL SEPTUM SURGERY  2002  . UPPER GI ENDOSCOPY  2013     Prior to Admission medications   Medication Sig Start Date End Date Taking? Authorizing Provider  amoxicillin-clavulanate (AUGMENTIN) 875-125 MG tablet Take 1 tablet by mouth 2 (two) times daily. 02/09/20   Carrie Mew, MD  baclofen (LIORESAL) 10 MG tablet Take 10-20 mg by mouth 2 (two) times daily as needed (for headaches).     [provider]  Diclofenac Potassium (CAMBIA) 50 MG PACK Take 50 mg by mouth 2 (two) times daily as needed. Migraines    [provider]  esomeprazole (NEXIUM) 40 MG capsule Take 40 mg by mouth daily before breakfast.    [provider]  famotidine (PEPCID) 20 MG tablet Take 1 tablet (20 mg total) by mouth 2 (two) times daily. 02/09/20   Carrie Mew, MD  HYDROcodone-acetaminophen (NORCO/VICODIN) 5-325 MG tablet Take 1-2 tablets by mouth every 4 (four) hours as needed for moderate pain. 05/03/16   Jackson Latino  L, PA  lamoTRIgine (LAMICTAL) 200 MG tablet Take 200 mg by mouth every evening.     [provider]  lisinopril (PRINIVIL,ZESTRIL) 10 MG tablet Take 10 mg by mouth every morning.    [provider]  naproxen (NAPROSYN) 500 MG tablet Take 1 tablet (500 mg total) by mouth 2 (two) times daily with a meal. 02/09/20   Carrie Mew, MD  ondansetron (ZOFRAN ODT) 4 MG disintegrating tablet Take 1 tablet (4 mg total) by mouth  every 8 (eight) hours as needed for nausea or vomiting. 02/09/20   Carrie Mew, MD  ondansetron (ZOFRAN) 4 MG tablet Take 1 tablet (4 mg total) by mouth every 6 (six) hours. Patient not taking: Reported on 05/03/2016 01/17/15   Comer Locket, PA-C     Allergies Doxycycline and Nitroglycerin   Family History  Problem Relation Age of Onset  . Cancer Father        skin  . Diabetes Father   . Cancer Mother        pancreatic 2010  . Hypertension Mother   . Cancer Paternal Aunt        breast    Social History Social History   Tobacco Use  . Smoking status: Never Smoker  . Smokeless tobacco: Never Used  Substance Use Topics  . Alcohol use: No  . Drug use: No    Review of Systems  Constitutional: Positive fever and chills.  ENT:   No sore throat. No rhinorrhea. Cardiovascular: Positive chest pain without syncope. Respiratory: Positive shortness of breath and nonproductive cough. Gastrointestinal:   Negative for abdominal pain, vomiting and diarrhea.  Musculoskeletal:   Negative for focal pain or swelling All other systems reviewed and are negative except as documented above in ROS and HPI.  ____________________________________________   PHYSICAL EXAM:  VITAL SIGNS: ED Triage Vitals [02/09/20 0938]  Enc Vitals Group     BP 131/89     Pulse Rate (!) 101     Resp 16     Temp 99.9 F (37.7 C)     Temp Source Oral     SpO2 96 %     Weight 212 lb (96.2 kg)     Height 5\' 8"  (1.727 m)     Head Circumference      Peak Flow      Pain Score 7     Pain Loc      Pain Edu?      Excl. in [place]?     Vital signs reviewed, nursing assessments reviewed.   Constitutional:   Alert and oriented. Non-toxic appearance. Eyes:   Conjunctivae are normal. EOMI. PERRL. ENT      Head:   Normocephalic and atraumatic.      Nose:   Wearing a mask.      Mouth/Throat:   Wearing a mask.      Neck:   No meningismus. Full ROM. Hematological/Lymphatic/Immunilogical:   No cervical  lymphadenopathy. Cardiovascular:   RRR. Symmetric bilateral radial and DP pulses.  No murmurs. Cap refill less than 2 seconds. Respiratory:   Normal respiratory effort without tachypnea/retractions. Breath sounds are clear and equal bilaterally. No wheezes/rales/rhonchi. Gastrointestinal:   Soft and nontender. Non distended. There is no CVA tenderness.  No rebound, rigidity, or guarding.  Musculoskeletal:   Normal range of motion in all extremities. No joint effusions.  No lower extremity tenderness.  No edema. Neurologic:   Normal speech and language.  Motor grossly intact. No acute focal neurologic deficits are  appreciated.  Skin:    Skin is warm, dry and intact. No rash noted.  No petechiae, purpura, or bullae.  ____________________________________________    LABS (pertinent positives/negatives) (all labs ordered are listed, but only abnormal results are displayed) Labs Reviewed  RESP PANEL BY RT-PCR (FLU A&B, COVID) ARPGX2 - Abnormal; Notable for the following components:      Result Value   SARS Coronavirus 2 by RT PCR POSITIVE (*)    All other components within normal limits  BASIC METABOLIC PANEL - Abnormal; Notable for the following components:   Potassium 3.3 (*)    Glucose, Bld 114 (*)    Creatinine, Ser 1.26 (*)    Calcium 8.4 (*)    All other components within normal limits  CBC  TROPONIN I (HIGH SENSITIVITY)   ____________________________________________   EKG  Interpreted by me Normal sinus rhythm rate of 96, normal axis and intervals.  Normal QRS ST segments and T waves.  ____________________________________________    RADIOLOGY  DG Chest 2 View  Result Date: 02/09/2020 CLINICAL DATA:  History of pneumonia.  Fevers, chills, body aches. EXAM: CHEST - 2 VIEW COMPARISON:  10/16/2012. FINDINGS: The heart size and mediastinal contours are within normal limits. Subtle opacity in left midlung. No visible pleural effusions or pneumothorax. No acute osseous  abnormality. IMPRESSION: Subtle opacity in left midlung, which may represent early pneumonia. Electronically Signed   By: Margaretha Sheffield MD   On: 02/09/2020 12:24   CT Angio Chest PE W and/or Wo Contrast  Result Date: 02/09/2020 CLINICAL DATA:  Fever, chills, nausea, chest and back pain, history of pneumonia EXAM: CT ANGIOGRAPHY CHEST WITH CONTRAST TECHNIQUE: Multidetector CT imaging of the chest was performed using the standard protocol during bolus administration of intravenous contrast. Multiplanar CT image reconstructions and MIPs were obtained to evaluate the vascular anatomy. CONTRAST:  14mL OMNIPAQUE IOHEXOL 350 MG/ML SOLN COMPARISON:  08/30/2012 FINDINGS: Cardiovascular: Heart size normal. Trace pericardial fluid. Satisfactory opacification of pulmonary arteries noted, and there is no evidence of pulmonary emboli. Adequate contrast opacification of the thoracic aorta with no evidence of dissection, aneurysm, or stenosis. There is classic 3-vessel brachiocephalic arch anatomy without proximal stenosis. No significant atheromatous change. Visualized proximal abdominal aorta unremarkable. Mediastinum/Nodes: No mass or adenopathy. Lungs/Pleura: 4.1 cm subsolid masslike infiltrate in the superior segment left lower lobe. Lungs otherwise clear. No pleural effusion. No pneumothorax. Upper Abdomen: No acute abnormality. Musculoskeletal: No chest wall abnormality. No acute or significant osseous findings. Review of the MIP images confirms the above findings. IMPRESSION: 1. 4.1 cm subsolid masslike infiltrate in the superior segment left lower lobe, favor infectious/inflammatory etiology. Followup PA and lateral chest X-ray is recommended in 3-4 weeks following trial of antibiotic therapy to ensure resolution and exclude underlying malignancy. 2. Negative for acute PE or thoracic aortic dissection. Electronically Signed   By: Lucrezia Europe M.D.   On: 02/09/2020 13:45     ____________________________________________   PROCEDURES Procedures  ____________________________________________  DIFFERENTIAL DIAGNOSIS   Pneumonia, pulmonary embolism, dehydration, Covid/viral respiratory infection, pneumothorax  CLINICAL IMPRESSION / ASSESSMENT AND PLAN / ED COURSE  Medications ordered in the ED: Medications  lactated ringers bolus 1,000 mL (0 mLs Intravenous Stopped 02/09/20 1402)  iohexol (OMNIPAQUE) 350 MG/ML injection 75 mL (75 mLs Intravenous Contrast Given 02/09/20 1330)    Pertinent labs & imaging results that were available during my care of the patient were reviewed by me and considered in my medical decision making (see chart for details).  Cloretta Ned Rede  [name] was evaluated in Emergency Department on 02/09/2020 for the symptoms described in the history of present illness. He was evaluated in the context of the global COVID-19 pandemic, which necessitated consideration that the patient might be at risk for infection with the SARS-CoV-2 virus that causes COVID-19. Institutional protocols and algorithms that pertain to the evaluation of patients at risk for COVID-19 are in a state of rapid change based on information released by regulatory bodies including the CDC and federal and state organizations. These policies and algorithms were followed during the patient's care in the ED.   Patient presents with persistent chest pain and shortness of breath for 2 months, not resolved with antibiotics.  Chest x-ray image viewed by me, appears unremarkable.  Radiology notes suspicion of a subtle infiltrate in the left midlung.  Will obtain CT scan to evaluate for PE or structural pathology.  Multiple constitutional symptoms suggestive of viral illness.  Will send Covid test.  IV fluids for dehydration.   ----------------------------------------- 2:20 PM on 02/09/2020 -----------------------------------------  Covid test positive.  CT negative for PE or  dissection.  It does reveal a 4 cm masslike infiltrate in the left lower lung.  With patient's previous antibiotic courses I think this is likely a chronic finding, will refer to pulmonology and prescribe Augmentin in the meantime.  Referral sent to monoclonal antibody team in case patient meets criteria.  Oxygenation is normal, vitals are normal after IV fluid hydration.  Is stable for discharge home.       ____________________________________________   FINAL CLINICAL IMPRESSION(S) / ED DIAGNOSES    Final diagnoses:  [id number] virus infection  Mass of lower lobe of left lung     ED Discharge Orders         Ordered    amoxicillin-clavulanate (AUGMENTIN) 875-125 MG tablet  2 times daily        02/09/20 1416    ondansetron (ZOFRAN ODT) 4 MG disintegrating tablet  Every 8 hours PRN        02/09/20 1416    famotidine (PEPCID) 20 MG tablet  2 times daily        02/09/20 1416    naproxen (NAPROSYN) 500 MG tablet  2 times daily with meals        02/09/20 1416          Portions of this note were generated with dragon dictation software. Dictation errors may occur despite best attempts at proofreading.   Carrie Mew, MD 02/09/20 1421

## 2020-02-09 NOTE — ED Notes (Signed)
Lab called. Pt is covid positive. Informed EDP and implemented precautions.

## 2020-02-09 NOTE — ED Notes (Signed)
Pt to ED c/o generalized body aches, nausea, fever that was up to 101.11f, and pleuritic CP that started 2 nights ago. Denies cough and diarrhea.

## 2020-02-09 NOTE — Discharge Instructions (Addendum)
Your CT scan shows a 4 cm solid area in the lung which appears to be related to pneumonia.  You should follow up with pulmonology for further evaluation of this finding.  Take a course of Augmentin antibiotic in the meantime.  A summary of the CT findings as included below.  Your Covid test was also positive today, which is causing your fever chills and body aches.  Drink lots of fluids to stay hydrated and allow yourself lots of rest.  You should isolate at home for the next 10 days to avoid spreading the infection to anyone else.  CT Chest summary: 1. 4.1 cm subsolid masslike infiltrate in the superior segment left  lower lobe, favor infectious/inflammatory etiology. Followup PA and  lateral chest X-ray is recommended in 3-4 weeks following trial of  antibiotic therapy to ensure resolution and exclude underlying  malignancy.  2. Negative for acute PE or thoracic aortic dissection.

## 2020-02-09 NOTE — ED Triage Notes (Signed)
Pt to ED via POV, pt states that on Friday he started having fever, chills, and nausea.Pt is also c/o chest and back pain. Pt states that he had a chest xray 2 weeks ago and it showed that he still had pneumonia. Pt states that he was initially diagnosed 2 months ago. Pt states that he has been on 3 different antibiotics but he is not getting better. Pt finished a course of Levaquin on Tuesday. Pt is in NAD.

## 2020-02-10 ENCOUNTER — Other Ambulatory Visit: Payer: Self-pay | Admitting: Nurse Practitioner

## 2020-02-10 DIAGNOSIS — U071 COVID-19: Secondary | ICD-10-CM

## 2020-02-10 DIAGNOSIS — Z9889 Other specified postprocedural states: Secondary | ICD-10-CM

## 2020-02-10 NOTE — Progress Notes (Signed)
I connected by phone with John [name] on 02/10/2020 at 3:02 PM to discuss the potential use of a new treatment for mild to moderate COVID-19 viral infection in non-hospitalized patients.  This patient is a 49 y.o. male that meets the FDA criteria for Emergency Use Authorization of COVID monoclonal antibody casirivimab/imdevimab, bamlanivimab/etesevimab, or sotrovimab.  Has a (+) direct SARS-CoV-2 viral test result  Has mild or moderate COVID-19   Is NOT hospitalized due to COVID-19  Is within 10 days of symptom onset  Has at least one of the high risk factor(s) for progression to severe COVID-19 and/or hospitalization as defined in EUA.  Specific high risk criteria : BMI > 25 and Cardiovascular disease or hypertension   I have spoken and communicated the following to the patient or parent/caregiver regarding COVID monoclonal antibody treatment:  1. FDA has authorized the emergency use for the treatment of mild to moderate COVID-19 in adults and pediatric patients with positive results of direct SARS-CoV-2 viral testing who are 75 years of age and older weighing at least 40 kg, and who are at high risk for progressing to severe COVID-19 and/or hospitalization.  2. The significant known and potential risks and benefits of COVID monoclonal antibody, and the extent to which such potential risks and benefits are unknown.  3. Information on available alternative treatments and the risks and benefits of those alternatives, including clinical trials.  4. Patients treated with COVID monoclonal antibody should continue to self-isolate and use infection control measures (e.g., wear mask, isolate, social distance, avoid sharing personal items, clean and disinfect "high touch" surfaces, and frequent handwashing) according to CDC guidelines.   5. The patient or parent/caregiver has the option to accept or refuse COVID monoclonal antibody treatment.  After reviewing this information with the  patient, the patient has agreed to receive one of the available covid 19 monoclonal antibodies and will be provided an appropriate fact sheet prior to infusion. Jobe Gibbon, NP 02/10/2020 3:02 PM

## 2020-02-11 ENCOUNTER — Ambulatory Visit (HOSPITAL_COMMUNITY)
Admission: RE | Admit: 2020-02-11 | Discharge: 2020-02-11 | Disposition: A | Discharge status: Home / Self Care | Payer: Medicare Other | Source: Ambulatory Visit | Attending: Pulmonary Disease | Admitting: Pulmonary Disease

## 2020-02-11 DIAGNOSIS — U071 COVID-19: Secondary | ICD-10-CM | POA: Insufficient documentation

## 2020-02-11 DIAGNOSIS — Z23 Encounter for immunization: Secondary | ICD-10-CM | POA: Diagnosis not present

## 2020-02-11 DIAGNOSIS — Z9889 Other specified postprocedural states: Secondary | ICD-10-CM | POA: Diagnosis present

## 2020-02-11 MED ORDER — METHYLPREDNISOLONE SODIUM SUCC 125 MG IJ SOLR: 125.0000 mg | Freq: Once | INTRAMUSCULAR | Status: DC | PRN

## 2020-02-11 MED ORDER — EPINEPHRINE 0.3 MG/0.3ML IJ SOAJ: 0.3000 mg | Freq: Once | INTRAMUSCULAR | Status: DC | PRN

## 2020-02-11 MED ORDER — ALBUTEROL SULFATE HFA 108 (90 BASE) MCG/ACT IN AERS: 2.0000 | INHALATION_SPRAY | Freq: Once | RESPIRATORY_TRACT | Status: DC | PRN

## 2020-02-11 MED ORDER — DIPHENHYDRAMINE HCL 50 MG/ML IJ SOLN: 50.0000 mg | Freq: Once | INTRAMUSCULAR | Status: DC | PRN

## 2020-02-11 MED ORDER — SODIUM CHLORIDE 0.9 % IV SOLN: Freq: Once | INTRAVENOUS | Status: AC

## 2020-02-11 MED ORDER — SODIUM CHLORIDE 0.9 % IV SOLN: INTRAVENOUS | Status: DC | PRN

## 2020-02-11 MED ORDER — ACETAMINOPHEN 325 MG PO TABS
650.0000 mg | ORAL_TABLET | Freq: Once | ORAL | Status: AC
  Administered 2020-02-11: 650 mg via ORAL
  Filled 2020-02-11: qty 2

## 2020-02-11 MED ORDER — FAMOTIDINE IN NACL 20-0.9 MG/50ML-% IV SOLN: 20.0000 mg | Freq: Once | INTRAVENOUS | Status: DC | PRN

## 2020-02-11 NOTE — Progress Notes (Signed)
  Diagnosis: COVID-19  Physician:Dr. Delford Field  Procedure: Covid Infusion Clinic Med: casirivimab\imdevimab infusion - Provided patient with casirivimab\imdevimab fact sheet for patients, parents and caregivers prior to infusion.  Complications: No immediate complications noted.  Discharge: Discharged home   Robb Matar 02/11/2020

## 2020-02-11 NOTE — Progress Notes (Signed)
Patient reviewed Fact Sheet for Patients, Parents, and Caregivers for Emergency Use Authorization (EUA) of bamlanivimab and etesevimab for the Treatment of Coronavirus. Patient also reviewed and is agreeable to the estimated cost of treatment. Patient is agreeable to proceed.   

## 2020-02-11 NOTE — Discharge Instructions (Signed)
10 Things You Can Do to Manage Your COVID-19 Symptoms at Home If you have possible or confirmed COVID-19: 1. Stay home from work and school. And stay away from other public places. If you must go out, avoid using any kind of public transportation, ridesharing, or taxis. 2. Monitor your symptoms carefully. If your symptoms get worse, call your healthcare provider immediately. 3. Get rest and stay hydrated. 4. If you have a medical appointment, call the healthcare provider ahead of time and tell them that you have or may have COVID-19. 5. For medical emergencies, call 911 and notify the dispatch personnel that you have or may have COVID-19. 6. Cover your cough and sneezes with a tissue or use the inside of your elbow. 7. Wash your hands often with soap and water for at least 20 seconds or clean your hands with an alcohol-based hand sanitizer that contains at least 60% alcohol. 8. As much as possible, stay in a specific room and away from other people in your home. Also, you should use a separate bathroom, if available. If you need to be around other people in or outside of the home, wear a mask. 9. Avoid sharing personal items with other people in your household, like dishes, towels, and bedding. 10. Clean all surfaces that are touched often, like counters, tabletops, and doorknobs. Use household cleaning sprays or wipes according to the label instructions. cdc.gov/coronavirus 08/15/2018 This information is not intended to replace advice given to you by your health care provider. Make sure you discuss any questions you have with your health care provider. Document Revised: 01/17/2019 Document Reviewed: 01/17/2019 Elsevier Patient Education  2020 Elsevier Inc. What types of side effects do monoclonal antibody drugs cause?  Common side effects  In general, the more common side effects caused by monoclonal antibody drugs include: . Allergic reactions, such as hives or itching . Flu-like signs and  symptoms, including chills, fatigue, fever, and muscle aches and pains . Nausea, vomiting . Diarrhea . Skin rashes . Low blood pressure   The CDC is recommending patients who receive monoclonal antibody treatments wait at least 90 days before being vaccinated.  Currently, there are no data on the safety and efficacy of mRNA COVID-19 vaccines in persons who received monoclonal antibodies or convalescent plasma as part of COVID-19 treatment. Based on the estimated half-life of such therapies as well as evidence suggesting that reinfection is uncommon in the 90 days after initial infection, vaccination should be deferred for at least 90 days, as a precautionary measure until additional information becomes available, to avoid interference of the antibody treatment with vaccine-induced immune responses. If you have any questions or concerns after the infusion please call the Advanced Practice Provider on call at [phone]. This number is ONLY intended for your use regarding questions or concerns about the infusion post-treatment side-effects.  Please do not provide this number to others for use. For return to work notes please contact your primary care provider.   If someone you know is interested in receiving treatment please have them call the COVID hotline at [phone].   

## 2020-03-09 ENCOUNTER — Institutional Professional Consult (permissible substitution): Payer: Medicare Other | Admitting: Pulmonary Disease

## 2020-03-10 ENCOUNTER — Encounter: Payer: Self-pay | Admitting: Pulmonary Disease

## 2020-03-10 ENCOUNTER — Ambulatory Visit (INDEPENDENT_AMBULATORY_CARE_PROVIDER_SITE_OTHER): Payer: Medicare Other | Admitting: Pulmonary Disease

## 2020-03-10 ENCOUNTER — Other Ambulatory Visit: Payer: Self-pay

## 2020-03-10 VITALS — BP 128/78 | HR 92 | Temp 97.5°F | Ht 68.0 in | Wt 202.0 lb

## 2020-03-10 DIAGNOSIS — R0602 Shortness of breath: Secondary | ICD-10-CM | POA: Diagnosis not present

## 2020-03-10 DIAGNOSIS — R070 Pain in throat: Secondary | ICD-10-CM | POA: Diagnosis not present

## 2020-03-10 DIAGNOSIS — R6889 Other general symptoms and signs: Secondary | ICD-10-CM

## 2020-03-10 DIAGNOSIS — Z8616 Personal history of COVID-19: Secondary | ICD-10-CM

## 2020-03-10 DIAGNOSIS — R918 Other nonspecific abnormal finding of lung field: Secondary | ICD-10-CM | POA: Diagnosis not present

## 2020-03-10 DIAGNOSIS — G4733 Obstructive sleep apnea (adult) (pediatric): Secondary | ICD-10-CM

## 2020-03-10 DIAGNOSIS — Z8719 Personal history of other diseases of the digestive system: Secondary | ICD-10-CM | POA: Diagnosis not present

## 2020-03-10 NOTE — Patient Instructions (Addendum)
We are going to get a split-night study to see the status of your sleep apnea and to see if there is a better fitting mask that will not cause you headaches  We are referring you to gastroenterologist  to evaluate your throat issues and swallowing issues particularly since you have had a history of Barrett's esophagus  I highly recommend that lisinopril be switched to a different medication to control your blood pressure, discuss this with your current primary care physician   To repeat a CT scan of the chest   We are going to get breathing tests   We will see you in follow-up in 4 to 6 weeks time call sooner should any new problems arise

## 2020-03-10 NOTE — Progress Notes (Signed)
Subjective:    Patient ID: John [name], male    DOB: 1970-04-22, 50 y.o.   MRN: [id number]  HPI John [name] is a 50 year old lifelong never smoker who presents for evaluation of an abnormality noted on CT scan of the chest during an ED visit.  He is self-referred.  His primary care practitioner Maryanna Shape, FNP.  The patient evaluated in December at the emergency room due to chest pain, fevers, chills nausea and body aches.  At that time he was noted to be COVID-19 positive.  A chest CT was performed that showed a left lung opacity which was suspected to be a pneumonia.  He continues to have issues with shortness of breath and a vague discomfort on the chest.  He notes however that he has started having "infectious symptoms" in October of last year.  He has been treated with multiple antibiotics to include a azithromycin, amoxicillin, levofloxacin and amoxicillin all between November and December of this past year.  He was also treated with Flovent inhaler which she quit using 2 weeks ago because of a sore throat and feeling that he may have thrush.  Notes that he has weakness in his legs.  Feels that he has a lump in his throat all the time.  Notes dysphagia mostly to solids.  He does not describe GERD symptoms per se.  He has a history of significant sleep apnea but has not been using his CPAP of late.  He feels that all of his symptoms get worse with activity but at rest sometimes helps some sometimes not.  Inhalers really did not help him much.  He has palpitations on occasion.  He also notes joint pain and swelling.  He suffers from migraines and states that he is disabled due to this.  He states that he has been unable to use his CPAP because the straps for the CPAP mask cause him to have headaches.  Reviewing his old records shows that a sleep study in 2009 had a very high AHI of 60 and oxygen nadir of 80%.  He was titrated at that time to CPAP of 14 cm.  He weighed 210 pounds at that  time.  Subsequently a split-night sleep study in 2015 at [facility] showed moderate obstructive sleep apnea with an AHI of 21 with a nadir of oxygen saturation between 80 to 84%.  His weight at the time was 178 pounds.  Since then he has put weight back on and he is currently at 202 pounds.  He has multiple other somatic complaints that are hard to characterize.   Review of Systems A 10 point review of systems was performed and it is as noted above otherwise negative.  Past Medical History:  Diagnosis Date  . Barrett esophagus   . BPH (benign prostatic hypertrophy)   . Depression   . GERD (gastroesophageal reflux disease)   . H/O hiatal hernia   . History of cardiac murmur as a child   . History of cardiac murmur as a child   . History of kidney stones   . Hx of chronic cholecystitis    w/ stone  . Hypertension   . Migraine   . OSA on CPAP    Past Surgical History:  Procedure Laterality Date  . APPENDECTOMY  1999  . CHOLECYSTECTOMY  05/02/2016   LAPROSCOPIC  . CHOLECYSTECTOMY N/A 05/02/2016   Procedure: LAPAROSCOPIC CHOLECYSTECTOMY WITH INTRAOPERATIVE CHOLANGIOGRAM;  Surgeon: Mickeal Skinner, MD;  Location: [place];  Service: General;  Laterality: N/A;  . ESOPHAGEAL MANOMETRY N/A 11/12/2012   Procedure: ESOPHAGEAL MANOMETRY (EM);  Surgeon: Beryle Beams, MD;  Location: [facility];  Service: Endoscopy;  Laterality: N/A;  . KNEE ARTHROSCOPY Right 04/18/2014   Procedure: RIGHT ARTHROSCOPY KNEE / PLICA EXCISION;  Surgeon: Johnn Hai, MD;  Location: [place];  Service: Orthopedics;  Laterality: Right;  . LAPAROSCOPIC NISSEN FUNDOPLICATION N/A [id number]   Procedure: LAPAROSCOPIC NISSEN FUNDOPLICATION;  Surgeon: Pedro Earls, MD;  Location: [facility];  Service: General;  Laterality: N/A;  . NASAL SEPTUM SURGERY  2002  . UPPER GI ENDOSCOPY  2013   Family History  Problem Relation Age of Onset  . Cancer Father        skin  . Diabetes Father   . Cancer Mother         pancreatic 2010  . Hypertension Mother   . Cancer Paternal Aunt        breast   Social History   Tobacco Use  . Smoking status: Never Smoker  . Smokeless tobacco: Never Used  Substance Use Topics  . Alcohol use: No   No occupational history, disabled due to headaches.  Allergies  Allergen Reactions  . Doxycycline Other (See Comments)    "Burning in throat"  . Nitroglycerin Other (See Comments)    Severe headache   Current Meds  Medication Sig  . baclofen (LIORESAL) 10 MG tablet Take 10-20 mg by mouth 2 (two) times daily as needed (for headaches).   . Diclofenac Potassium,Migraine, 50 MG PACK Take 50 mg by mouth 2 (two) times daily as needed. Migraines  . esomeprazole (NEXIUM) 40 MG capsule Take 40 mg by mouth daily before breakfast.  . fluticasone (FLOVENT HFA) 110 MCG/ACT inhaler 2 puffs 2 (two) times daily.  Marland Kitchen lisinopril (PRINIVIL,ZESTRIL) 10 MG tablet Take 10 mg by mouth every morning.  . naproxen (NAPROSYN) 500 MG tablet Take 1 tablet (500 mg total) by mouth 2 (two) times daily with a meal.  . ondansetron (ZOFRAN ODT) 4 MG disintegrating tablet Take 1 tablet (4 mg total) by mouth every 8 (eight) hours as needed for nausea or vomiting.  . ondansetron (ZOFRAN) 4 MG tablet Take 1 tablet (4 mg total) by mouth every 6 (six) hours.  Marland Kitchen PROVENTIL HFA 108 (90 Base) MCG/ACT inhaler Inhale 2 puff using inhaler every six hours as needed for breathing    There is no immunization history on file for this patient.   We discussed Covid-19 precautions.  I reviewed the vaccine effectiveness and potential side effects in detail to include differences between mRNA vaccines and traditional vaccines (attenuated virus).  Discussion also offered of long-term effectiveness and safety profile which are unclear at this time.  Discussed current CDC guidance that all patients are recommended COVID-19 vaccinations [when available]-as long as they do not have allergy to components of the vaccine.      Objective:   Physical Exam BP 128/78 (BP Location: Left Arm, Cuff Size: Normal)   Pulse 92   Temp (!) 97.5 F (36.4 C) (Temporal)   Ht 5\' 8"  (1.727 m)   Wt 202 lb (91.6 kg)   SpO2 96%   BMI 30.71 kg/m  GENERAL: Well-developed, overweight gentleman in no acute distress. HEAD: Normocephalic, atraumatic.  EYES: Pupils equal, round, reactive to light.  No scleral icterus.  MOUTH: Mallampati class [name], no overt thrush noted.  Difficult visualization. NECK: Supple. No thyromegaly. Trachea midline. No JVD.  No adenopathy. PULMONARY:  Good air entry bilaterally.  No adventitious sounds. CARDIOVASCULAR: S1 and S2. Regular rate and rhythm.  No rubs, murmurs or gallops heard. ABDOMEN: Protuberant, benign. MUSCULOSKELETAL: No joint deformity, no clubbing, no edema.  NEUROLOGIC: No overt focal deficit, no gait disturbance, speech is fluent. SKIN: Intact,warm,dry.  On limited exam no rashes. PSYCH: Flat affect, normal behavior  Representative slice of CT chest performed 09 February 2020, independently reviewed, showing 4.1 cm of solid masslike infiltrate in the superior segment of the left lower lobe:      Assessment & Plan:     ICD-10-CM   1. Shortness of breath  R06.02 CT Chest Wo Contrast    Pulmonary Function Test [facility] Only   Will obtain PFTs Ambulatory oximetry today did not show desaturations  2. Mass of left lung  R91.8    It appears this was an inflammatory process Had persistent x-ray abnormality Repeat CT chest for evaluation  3. Throat discomfort  R07.0 CANCELED: Ambulatory referral to ENT   Query related to GERD No evidence of thrush on today's exam  4. History of Barrett's esophagus - dysphagia  Z87.19 Ambulatory referral to Gastroenterology   GI referral query need for EGD  5. OSA (obstructive sleep apnea)  G47.33 Split night study   He has not had adequate treatment Will obtain split-night study May need evaluation by our sleep physicians  6. History of COVID-19   Z86.16 CANCELED: Pulse oximetry, overnight   This issue adds complexity to his management  7. Multiple somatic complaints  R68.89    This issue adds complexity to his management   Orders Placed This Encounter  Procedures  . CT Chest Wo Contrast    Next available.    Standing Status:   Future    Standing Expiration Date:   03/10/2021    Order Specific Question:   Preferred imaging location?    Answer:   [facility]  . Ambulatory referral to Gastroenterology    Referral Priority:   Routine    Referral Type:   Consultation    Referral Reason:   Specialty Services Required    Number of Visits Requested:   1  . Pulmonary Function Test [facility] Only    Standing Status:   Future    Standing Expiration Date:   03/10/2021    Scheduling Instructions:     Next available.    Order Specific Question:   Full PFT: includes the following: basic spirometry, spirometry pre & post bronchodilator, diffusion capacity (DLCO), lung volumes    Answer:   Full PFT  . Split night study    Standing Status:   Future    Standing Expiration Date:   03/10/2021    Order Specific Question:   Where should this test be performed:    Answer:   [place]   Discussion:  Will repeat CT scan of the chest to evaluate the abnormality noted number.  I do not believe that he needs more antibiotics at present.  He does need evaluation by gastroenterology due to dysphagia and prior history of Barrett's esophagus.  We will evaluate his shortness of breath with pulmonary function testing though at present by clinical impression it does not appear that he has intrinsic lung disease.  He needs to have his sleep apnea reassessed.  He has difficulties using mask but straps but perhaps there are other possibilities to help with this issue.  I did advise him that persistent sleep apnea symptoms can aggravate his headaches as well.  As  an aside he is on lisinopril and given his issues with globus sensation I would recommend that this  medication be switched to a different antihypertensive, he is to discuss this with his primary care practitioner.  We will see him in follow-up in 4 to 6 weeks time he is to contact us prior to that time should any new difficulties arise.  Renold Don, MD [facility]   *This note was dictated using voice recognition software/Dragon.  Despite best efforts to proofread, errors can occur which can change the meaning.  Any change was purely unintentional.

## 2020-03-24 ENCOUNTER — Other Ambulatory Visit: Payer: Self-pay

## 2020-03-24 ENCOUNTER — Ambulatory Visit
Admission: RE | Admit: 2020-03-24 | Discharge: 2020-03-24 | Disposition: A | Discharge status: Home / Self Care | Payer: Medicare Other | Source: Ambulatory Visit | Attending: Pulmonary Disease | Admitting: Pulmonary Disease

## 2020-03-24 DIAGNOSIS — R0602 Shortness of breath: Secondary | ICD-10-CM

## 2020-04-07 ENCOUNTER — Institutional Professional Consult (permissible substitution): Payer: Medicare Other | Admitting: Internal Medicine

## 2020-04-07 ENCOUNTER — Other Ambulatory Visit: Payer: Self-pay

## 2020-04-07 ENCOUNTER — Ambulatory Visit: Payer: Medicare Other | Attending: Pulmonary Disease

## 2020-04-07 DIAGNOSIS — R0602 Shortness of breath: Secondary | ICD-10-CM | POA: Insufficient documentation

## 2020-04-07 LAB — PULMONARY FUNCTION TEST ARMC ONLY
DL/VA % pred: 122 %
DL/VA: 5.52 ml/min/mmHg/L
DLCO unc % pred: 104 %
DLCO unc: 29.3 ml/min/mmHg
FEF 25-75 Post: 3.14 L/sec
FEF 25-75 Pre: 3.94 L/sec
FEF2575-%Change-Post: -20 %
FEF2575-%Pred-Post: 92 %
FEF2575-%Pred-Pre: 116 %
FEV1-%Change-Post: 0 %
FEV1-%Pred-Post: 82 %
FEV1-%Pred-Pre: 83 %
FEV1-Post: 3.09 L
FEV1-Pre: 3.11 L
FEV1FVC-%Change-Post: -10 %
FEV1FVC-%Pred-Pre: 109 %
FEV6-%Change-Post: 10 %
FEV6-%Pred-Post: 87 %
FEV6-%Pred-Pre: 79 %
FEV6-Post: 4.03 L
FEV6-Pre: 3.65 L
FEV6FVC-%Pred-Post: 103 %
FEV6FVC-%Pred-Pre: 103 %
FVC-%Change-Post: 10 %
FVC-%Pred-Post: 84 %
FVC-%Pred-Pre: 76 %
FVC-Post: 4.03 L
Post FEV1/FVC ratio: 77 %
Post FEV6/FVC ratio: 100 %
Pre FEV1/FVC ratio: 85 %
Pre FEV6/FVC Ratio: 100 %
RV % pred: 79 %
RV: 1.5 L
TLC % pred: 79 %
TLC: 5.19 L

## 2020-04-07 MED ORDER — ALBUTEROL SULFATE (2.5 MG/3ML) 0.083% IN NEBU
2.5000 mg | INHALATION_SOLUTION | Freq: Once | RESPIRATORY_TRACT | Status: AC
  Administered 2020-04-07: 2.5 mg via RESPIRATORY_TRACT
  Filled 2020-04-07: qty 3

## 2020-04-10 ENCOUNTER — Encounter: Payer: Self-pay | Admitting: Pulmonary Disease

## 2020-04-10 ENCOUNTER — Ambulatory Visit: Payer: Medicare Other | Attending: Pulmonary Disease

## 2020-04-10 DIAGNOSIS — G4733 Obstructive sleep apnea (adult) (pediatric): Secondary | ICD-10-CM | POA: Insufficient documentation

## 2020-04-13 ENCOUNTER — Other Ambulatory Visit: Payer: Self-pay

## 2020-04-14 ENCOUNTER — Ambulatory Visit (INDEPENDENT_AMBULATORY_CARE_PROVIDER_SITE_OTHER): Payer: Medicare Other | Admitting: Pulmonary Disease

## 2020-04-14 ENCOUNTER — Encounter: Payer: Self-pay | Admitting: Pulmonary Disease

## 2020-04-14 VITALS — BP 132/78 | HR 80 | Temp 97.3°F | Ht 68.0 in | Wt 200.6 lb

## 2020-04-14 DIAGNOSIS — G4733 Obstructive sleep apnea (adult) (pediatric): Secondary | ICD-10-CM

## 2020-04-14 DIAGNOSIS — R0602 Shortness of breath: Secondary | ICD-10-CM

## 2020-04-14 DIAGNOSIS — R918 Other nonspecific abnormal finding of lung field: Secondary | ICD-10-CM

## 2020-04-14 DIAGNOSIS — Z8719 Personal history of other diseases of the digestive system: Secondary | ICD-10-CM | POA: Diagnosis not present

## 2020-04-14 DIAGNOSIS — U099 Post covid-19 condition, unspecified: Secondary | ICD-10-CM

## 2020-04-14 NOTE — Progress Notes (Signed)
Subjective:    Patient ID: John [name], male    DOB: 05/02/1970, 50 y.o.   MRN: [id number]  HPI Patient is a 50 year old girl who presents for follow-up on abnormal CT chest and on exertion more significant after COVID-19 in December 2020.  Patient also has a history of obstructive sleep apnea and has not been using CPAP.  Patient had a CT chest on February 8 which showed complete resolution of the previously noted area of consolidation/mass on the left lung.  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  He has a history of GERD and Barrett's esophagus, he has an upcoming appointment with GI.  PFTs were performed on 22 February and are consistent with obstruction, mild restriction likely related to obesity.  Excellent diffusion capacity.   Review of Systems A 10 point review of systems was performed and it is as noted above otherwise negative.  Patient Active Problem List   Diagnosis Date Noted  . Symptomatic cholelithiasis 05/02/2016  . Acute cholecystitis 05/02/2016  . 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/06/2013  . Lap Nissen over 39 Fr with single suture closure of diaphragm 04/05/2013  . Right inguinal hernia 04/05/2013  . GERD (gastroesophageal reflux disease) 01/25/2013  . 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  . Barrett esophagus 08/30/2012  . OSA (obstructive sleep apnea) 08/30/2012   Allergies  Allergen Reactions  . Doxycycline Other (See Comments)    "Burning in throat"  . Nitroglycerin Other (See Comments)    Severe headache   Current Meds  Medication Sig  . amLODipine (NORVASC) 5 MG tablet Take 5 mg by mouth daily.  . baclofen (LIORESAL) 10 MG tablet Take 10-20 mg by mouth 2 (two) times daily as needed (for headaches).   . Diclofenac Potassium,Migraine, 50 MG PACK Take 50 mg by mouth 2 (two) times daily as needed. Migraines  . esomeprazole (NEXIUM) 40 MG capsule Take 40 mg by mouth daily before breakfast.  .  naproxen (NAPROSYN) 500 MG tablet Take 1 tablet (500 mg total) by mouth 2 (two) times daily with a meal.  . ondansetron (ZOFRAN ODT) 4 MG disintegrating tablet Take 1 tablet (4 mg total) by mouth every 8 (eight) hours as needed for nausea or vomiting.  Marland Kitchen PROVENTIL HFA 108 (90 Base) MCG/ACT inhaler Inhale 2 puff using inhaler every six hours as needed for breathing    There is no immunization history on file for this patient.     Objective:   Physical Exam BP 132/78 (BP Location: Left Arm, Cuff Size: Normal)   Pulse 80   Temp (!) 97.3 F (36.3 C) (Temporal)   Ht 5\' 8"  (1.727 m)   Wt 200 lb 9.6 oz (91 kg)   SpO2 97%   BMI 30.50 kg/m  GENERAL: Well-developed, overweight gentleman in no acute distress. HEAD: Normocephalic, atraumatic.  EYES: Pupils equal, round, reactive to light.  No scleral icterus.  MOUTH: Mallampati class [name], no overt thrush noted.  Difficult visualization. NECK: Supple. No thyromegaly. Trachea midline. No JVD.  No adenopathy. PULMONARY: Good air entry bilaterally.  No adventitious sounds. CARDIOVASCULAR: S1 and S2. Regular rate and rhythm.  No rubs, murmurs or gallops heard. ABDOMEN: Protuberant, benign. MUSCULOSKELETAL: No joint deformity, no clubbing, no edema.  NEUROLOGIC: No overt focal deficit, no gait disturbance, speech is fluent. SKIN: Intact,warm,dry.  On limited exam no rashes. PSYCH: Flat affect, normal behavior  24 March 2020 chest CT shows resolution of prior opacity/mass noted on 09 February 2020  CT. some areas of mild groundglass inflammation remain.  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 Shortness of breath  R06.02    Suspect due to deconditioning Weight loss to Ideal BMI may help  3. History of  Barrett's esophagus - dysphagia  Z87.19    Has upcoming appointment with GI Suspect still having some issues with reflux  4. OSA (obstructive sleep apnea)  G47.33    Await results of sleep study (split-night)  5. Abnormal CT scan of lung  R91.8    Findings have now cleared with some minimal groundglass opacities residual Groundglass opacities may be due to chronic silent aspiration   I referred the patient to the [place] clinic.  We will notify the patient when the results of sleep study are available.  We will set follow-up after sleep study available, may need follow-up with our sleep physicians.  Renold Don, MD [place]   *This note was dictated using voice recognition software/Dragon.  Despite best efforts to proofread, errors can occur which can change the meaning.  Any change was purely unintentional.

## 2020-04-14 NOTE — Patient Instructions (Signed)
We are providing you with information of the [facility] clinic that is handling the post COVID issues that you are having.  They do a tremendous work.  And I think this will be helpful to you.  We will call you with the results of the sleep study when available.  We will then refer you to one of our sleep physicians.

## 2020-04-23 ENCOUNTER — Other Ambulatory Visit: Payer: Self-pay

## 2020-04-23 ENCOUNTER — Emergency Department: Payer: Medicare Other

## 2020-04-23 ENCOUNTER — Emergency Department
Admission: EM | Admit: 2020-04-23 | Discharge: 2020-04-23 | Disposition: A | Discharge status: Home / Self Care | Payer: Medicare Other | Attending: Emergency Medicine | Admitting: Emergency Medicine

## 2020-04-23 DIAGNOSIS — K2950 Unspecified chronic gastritis without bleeding: Secondary | ICD-10-CM | POA: Diagnosis not present

## 2020-04-23 DIAGNOSIS — R070 Pain in throat: Secondary | ICD-10-CM | POA: Diagnosis not present

## 2020-04-23 DIAGNOSIS — R079 Chest pain, unspecified: Secondary | ICD-10-CM | POA: Diagnosis present

## 2020-04-23 LAB — HEPATIC FUNCTION PANEL
ALT: 48 U/L — ABNORMAL HIGH (ref 0–44)
AST: 35 U/L (ref 15–41)
Albumin: 4.5 g/dL (ref 3.5–5.0)
Alkaline Phosphatase: 100 U/L (ref 38–126)
Bilirubin, Direct: 0.1 mg/dL (ref 0.0–0.2)
Indirect Bilirubin: 0.9 mg/dL (ref 0.3–0.9)
Total Bilirubin: 1 mg/dL (ref 0.3–1.2)
Total Protein: 7.3 g/dL (ref 6.5–8.1)

## 2020-04-23 LAB — BASIC METABOLIC PANEL
Anion gap: 8 (ref 5–15)
BUN: 13 mg/dL (ref 6–20)
CO2: 28 mmol/L (ref 22–32)
Calcium: 9.1 mg/dL (ref 8.9–10.3)
Chloride: 104 mmol/L (ref 98–111)
Creatinine, Ser: 0.98 mg/dL (ref 0.61–1.24)
GFR, Estimated: >60 mL/min (ref >60)
Glucose, Bld: 102 mg/dL — ABNORMAL HIGH (ref 70–99)
Potassium: 3.7 mmol/L (ref 3.5–5.1)
Sodium: 140 mmol/L (ref 135–145)

## 2020-04-23 LAB — LIPASE, BLOOD: Lipase: 32 U/L (ref 11–51)

## 2020-04-23 LAB — TROPONIN I (HIGH SENSITIVITY)
Troponin I (High Sensitivity): <2 ng/L (ref <18)
Troponin I (High Sensitivity): 3 ng/L (ref <18)

## 2020-04-23 LAB — CBC
HCT: 46.2 % (ref 39.0–52.0)
Hemoglobin: 15.5 g/dL (ref 13.0–17.0)
MCH: 29.8 pg (ref 26.0–34.0)
MCHC: 33.5 g/dL (ref 30.0–36.0)
MCV: 88.8 fL (ref 80.0–100.0)
Platelets: 295 10*3/uL (ref 150–400)
RBC: 5.2 MIL/uL (ref 4.22–5.81)
RDW: 13 % (ref 11.5–15.5)
WBC: 6.3 10*3/uL (ref 4.0–10.5)
nRBC: 0 % (ref 0.0–0.2)

## 2020-04-23 MED ORDER — METOCLOPRAMIDE HCL 10 MG PO TABS
10.0000 mg | ORAL_TABLET | Freq: Once | ORAL | Status: AC
  Administered 2020-04-23: 10 mg via ORAL
  Filled 2020-04-23: qty 1

## 2020-04-23 MED ORDER — FAMOTIDINE 20 MG PO TABS
40.0000 mg | ORAL_TABLET | Freq: Once | ORAL | Status: AC
  Administered 2020-04-23: 40 mg via ORAL
  Filled 2020-04-23: qty 2

## 2020-04-23 MED ORDER — ALUMINUM-MAGNESIUM-SIMETHICONE 200-200-20 MG/5ML PO SUSP: 30.0000 mL | Freq: Three times a day (TID) | ORAL | 0 refills | Status: DC

## 2020-04-23 MED ORDER — ALUM & MAG HYDROXIDE-SIMETH 200-200-20 MG/5ML PO SUSP
30.0000 mL | Freq: Once | ORAL | Status: AC
  Administered 2020-04-23: 30 mL via ORAL
  Filled 2020-04-23: qty 30

## 2020-04-23 MED ORDER — FAMOTIDINE 20 MG PO TABS: 20.0000 mg | ORAL_TABLET | Freq: Two times a day (BID) | ORAL | 0 refills | Status: DC

## 2020-04-23 MED ORDER — METOCLOPRAMIDE HCL 10 MG PO TABS: 10.0000 mg | ORAL_TABLET | Freq: Four times a day (QID) | ORAL | 0 refills | Status: DC | PRN

## 2020-04-23 NOTE — ED Provider Notes (Signed)
[facility] Emergency Department Provider Note  ____________________________________________  Time seen: Approximately 3:41 PM  I have reviewed the triage vital signs and the nursing notes.   HISTORY  Chief Complaint Chest Pain    HPI John [name] is a 50 y.o. male with a history of Barrett's esophagus GERD and hypertension who comes ED complaining of lower central chest pain that is waxing and waning, feels like burning, radiates to the throat.  Not exertional, not pleuritic.  No aggravating or alleviating factors.  Eating and drinking okay.  Taking a PPI.  Has an appointment with gastroenterology later this month.   Feels similar to his established GERD symptoms, but has been worse the last 2 days and his GI appointment that was scheduled for next week got pushed back to the end of the month, so he comes the ED.  Has been compliant with his medication taking Carafate and PPI, reports that the Carafate is not helping.     Past Medical History:  Diagnosis Date  . Barrett esophagus   . BPH (benign prostatic hypertrophy)   . Depression   . GERD (gastroesophageal reflux disease)   . H/O hiatal hernia   . History of cardiac murmur as a child   . History of cardiac murmur as a child   . History of kidney stones   . Hx of chronic cholecystitis    w/ stone  . Hypertension   . Migraine   . OSA on CPAP      Patient Active Problem List   Diagnosis Date Noted  . Symptomatic cholelithiasis 05/02/2016  . Acute cholecystitis 05/02/2016  . 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/06/2013  . Lap Nissen over 84 Fr with single suture closure of diaphragm 04/05/2013  . Right inguinal hernia 04/05/2013  . GERD (gastroesophageal reflux disease) 01/25/2013  . 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  . Barrett esophagus 08/30/2012  . OSA (obstructive sleep apnea) 08/30/2012     Past Surgical History:  Procedure Laterality Date  . APPENDECTOMY  1999  . CHOLECYSTECTOMY   05/02/2016   LAPROSCOPIC  . CHOLECYSTECTOMY N/A 05/02/2016   Procedure: LAPAROSCOPIC CHOLECYSTECTOMY WITH INTRAOPERATIVE CHOLANGIOGRAM;  Surgeon: Mickeal Skinner, MD;  Location: [place];  Service: General;  Laterality: N/A;  . ESOPHAGEAL MANOMETRY N/A 11/12/2012   Procedure: ESOPHAGEAL MANOMETRY (EM);  Surgeon: Beryle Beams, MD;  Location: [facility];  Service: Endoscopy;  Laterality: N/A;  . KNEE ARTHROSCOPY Right 04/18/2014   Procedure: RIGHT ARTHROSCOPY KNEE / PLICA EXCISION;  Surgeon: Johnn Hai, MD;  Location: [place];  Service: Orthopedics;  Laterality: Right;  . LAPAROSCOPIC NISSEN FUNDOPLICATION N/A 03/26/4707   Procedure: LAPAROSCOPIC NISSEN FUNDOPLICATION;  Surgeon: Pedro Earls, MD;  Location: [facility];  Service: General;  Laterality: N/A;  . NASAL SEPTUM SURGERY  2002  . UPPER GI ENDOSCOPY  2013     Prior to Admission medications   Medication Sig Start Date End Date Taking? Authorizing Provider  aluminum-magnesium hydroxide-simethicone (MAALOX) [phone] MG/5ML SUSP Take 30 mLs by mouth 4 (four) times daily -  before meals and at bedtime. 04/23/20  Yes Carrie Mew, MD  famotidine (PEPCID) 20 MG tablet Take 1 tablet (20 mg total) by mouth 2 (two) times daily for 7 days. 04/23/20 04/30/20 Yes Carrie Mew, MD  metoCLOPramide (REGLAN) 10 MG tablet Take 1 tablet (10 mg total) by mouth every 6 (six) hours as needed. 04/23/20  Yes Carrie Mew, MD  amLODipine (NORVASC) 5 MG tablet Take 5 mg  by mouth daily. 03/10/20   [provider]  baclofen (LIORESAL) 10 MG tablet Take 10-20 mg by mouth 2 (two) times daily as needed (for headaches).     [provider]  Diclofenac Potassium,Migraine, 50 MG PACK Take 50 mg by mouth 2 (two) times daily as needed. Migraines    [provider]  esomeprazole (NEXIUM) 40 MG capsule Take 40 mg by mouth daily before breakfast.    [provider]  fluticasone (FLOVENT HFA) 110 MCG/ACT  inhaler 2 puffs 2 (two) times daily. 02/05/20   [provider]  HYDROcodone-acetaminophen (NORCO/VICODIN) 5-325 MG tablet Take 1-2 tablets by mouth every 4 (four) hours as needed for moderate pain. 05/03/16   Focht, Fraser Din, PA  lamoTRIgine (LAMICTAL) 200 MG tablet Take 200 mg by mouth every evening.     [provider]  naproxen (NAPROSYN) 500 MG tablet Take 1 tablet (500 mg total) by mouth 2 (two) times daily with a meal. 02/09/20   Carrie Mew, MD  ondansetron (ZOFRAN ODT) 4 MG disintegrating tablet Take 1 tablet (4 mg total) by mouth every 8 (eight) hours as needed for nausea or vomiting. 02/09/20   Carrie Mew, MD  PROVENTIL HFA 108 [phone] Base) MCG/ACT inhaler Inhale 2 puff using inhaler every six hours as needed for breathing 12/06/19   [provider]     Allergies Doxycycline and Nitroglycerin   Family History  Problem Relation Age of Onset  . Cancer Father        skin  . Diabetes Father   . Cancer Mother        pancreatic 2010  . Hypertension Mother   . Cancer Paternal Aunt        breast    Social History Social History   Tobacco Use  . Smoking status: Never Smoker  . Smokeless tobacco: Never Used  Substance Use Topics  . Alcohol use: No  . Drug use: No    Review of Systems  Constitutional:   No fever or chills.  ENT:   No sore throat. No rhinorrhea. Cardiovascular:   No chest pain or syncope. Respiratory:   No dyspnea or cough. Gastrointestinal:   Positive lower central chest pain.  No vomiting or diarrhea Musculoskeletal:   Negative for focal pain or swelling All other systems reviewed and are negative except as documented above in ROS and HPI.  ____________________________________________   PHYSICAL EXAM:  VITAL SIGNS: ED Triage Vitals  Enc Vitals Group     BP 04/23/20 0848 (!) 168/97     Pulse Rate 04/23/20 0848 73     Resp 04/23/20 0848 18     Temp 04/23/20 0848 98 F (36.7 C)     Temp src --      SpO2  04/23/20 0848 100 %     Weight 04/23/20 0850 200 lb 9.6 oz (91 kg)     Height 04/23/20 0850 5\' 8"  (1.727 m)     Head Circumference --      Peak Flow --      Pain Score 04/23/20 0850 4     Pain Loc --      Pain Edu? --      Excl. in [place]? --     Vital signs reviewed, nursing assessments reviewed.   Constitutional:   Alert and oriented. Non-toxic appearance. Eyes:   Conjunctivae are normal. EOMI. PERRL. ENT      Head:   Normocephalic and atraumatic.      Nose:  Normal      Mouth/Throat: Moist mucous membranes.      Neck:   No meningismus. Full ROM. Hematological/Lymphatic/Immunilogical:   No cervical lymphadenopathy. Cardiovascular:   RRR. Symmetric bilateral radial and DP pulses.  No murmurs. Cap refill less than 2 seconds. Respiratory:   Normal respiratory effort without tachypnea/retractions. Breath sounds are clear and equal bilaterally. No wheezes/rales/rhonchi. Gastrointestinal:   Soft with left upper quadrant tenderness which reproduces his pain. Non distended. There is no CVA tenderness.  No rebound, rigidity, or guarding. Genitourinary:   deferred Musculoskeletal:   Normal range of motion in all extremities. No joint effusions.  No lower extremity tenderness.  No edema.  Chest wall nontender Neurologic:   Normal speech and language.  Motor grossly intact. No acute focal neurologic deficits are appreciated.  Skin:    Skin is warm, dry and intact. No rash noted.  No petechiae, purpura, or bullae.  ____________________________________________    LABS (pertinent positives/negatives) (all labs ordered are listed, but only abnormal results are displayed) Labs Reviewed  BASIC METABOLIC PANEL - Abnormal; Notable for the following components:      Result Value   Glucose, Bld 102 (*)    All other components within normal limits  HEPATIC FUNCTION PANEL - Abnormal; Notable for the following components:   ALT 48 (*)    All other components within normal limits  CBC  LIPASE,  BLOOD  TROPONIN I (HIGH SENSITIVITY)  TROPONIN I (HIGH SENSITIVITY)   ____________________________________________   EKG  Interpreted by me Normal sinus rhythm rate of 78, normal axis and intervals.  Normal QRS ST segments and T waves.  Voltage criteria for LVH in the high lateral leads.  Inferior T wave inversion which is nonischemic in appearance.  Unchanged compared to February 09, 2020.  ____________________________________________    RADIOLOGY  DG Chest 2 View  Result Date: 04/23/2020 CLINICAL DATA:  Palpitations and chest pain. EXAM: CHEST - 2 VIEW COMPARISON:  Chest radiograph 02/09/2020. CT chest March 24, 2020. FINDINGS: The heart size and mediastinal contours are within normal limits. Both lungs are clear. Resolution of the previously seen left midlung opacity. No visible pleural effusions or pneumothorax. No acute osseous abnormality. IMPRESSION: No radiographic evidence of acute cardiopulmonary disease. Electronically Signed   By: Margaretha Sheffield MD   On: 04/23/2020 09:29    ____________________________________________   PROCEDURES Procedures  ____________________________________________  DIFFERENTIAL DIAGNOSIS   GERD/gastritis, pancreatitis, hepatitis, choledocholithiasis, non-STEMI  CLINICAL IMPRESSION / ASSESSMENT AND PLAN / ED COURSE  Medications ordered in the ED: Medications  alum & mag hydroxide-simeth (MAALOX/MYLANTA) 200-200-20 MG/5ML suspension 30 mL (30 mLs Oral Given 04/23/20 1255)  famotidine (PEPCID) tablet 40 mg (40 mg Oral Given 04/23/20 1255)  metoCLOPramide (REGLAN) tablet 10 mg (10 mg Oral Given 04/23/20 1256)    Pertinent labs & imaging results that were available during my care of the patient were reviewed by me and considered in my medical decision making (see chart for details).  John [name] was evaluated in Emergency Department on 04/23/2020 for the symptoms described in the history of present illness. He was evaluated in the  context of the global COVID-19 pandemic, which necessitated consideration that the patient might be at risk for infection with the SARS-CoV-2 virus that causes COVID-19. Institutional protocols and algorithms that pertain to the evaluation of patients at risk for COVID-19 are in a state of rapid change based on information released by regulatory bodies including the CDC and federal and state organizations. These  policies and algorithms were followed during the patient's care in the ED.     Clinical Course as of 04/23/20 1543  Thu Apr 23, 2020  1410 Patient presents with atypical chest pain, left upper quadrant tenderness consistent with GERD and gastritis.  EKG, chest x-ray, labs all reassuring. Considering the patient's symptoms, medical history, and physical examination today, I have low suspicion for ACS, PE, TAD, pneumothorax, carditis, mediastinitis, pneumonia, CHF, or sepsis.  Abdomen is nonsurgical.  Doubt pancreatitis or bowel obstruction.     He has a GI appointment scheduled for later this month, encouraged him to keep that.  We will escalate antacid therapy in the meantime. [PS]    Clinical Course User Index [PS] Carrie Mew, MD     ____________________________________________   FINAL CLINICAL IMPRESSION(S) / ED DIAGNOSES    Final diagnoses:  Chronic gastritis without bleeding, unspecified gastritis type     ED Discharge Orders         Ordered    aluminum-magnesium hydroxide-simethicone (MAALOX) 200-200-20 MG/5ML SUSP  3 times daily before meals & bedtime        04/23/20 1540    famotidine (PEPCID) 20 MG tablet  2 times daily        04/23/20 1540    metoCLOPramide (REGLAN) 10 MG tablet  Every 6 hours PRN        04/23/20 1540          Portions of this note were generated with dragon dictation software. Dictation errors may occur despite best attempts at proofreading.   Carrie Mew, MD 04/23/20 1550

## 2020-04-23 NOTE — ED Triage Notes (Signed)
Pt comes with c/o palpitations, CP and some SOB. Pt states this has been ongoing but recently on Tuesday it got worse.  Pt states mid sternal CP and discomfort. Pt denies any radiation, N./V./D

## 2020-04-28 ENCOUNTER — Ambulatory Visit: Payer: Medicare Other | Admitting: Gastroenterology

## 2020-05-07 ENCOUNTER — Encounter: Payer: Self-pay | Admitting: Gastroenterology

## 2020-05-07 ENCOUNTER — Ambulatory Visit (INDEPENDENT_AMBULATORY_CARE_PROVIDER_SITE_OTHER): Payer: Medicare Other | Admitting: Gastroenterology

## 2020-05-07 ENCOUNTER — Other Ambulatory Visit: Payer: Self-pay

## 2020-05-07 VITALS — BP 139/89 | HR 75 | Ht 68.0 in | Wt 204.8 lb

## 2020-05-07 DIAGNOSIS — R748 Abnormal levels of other serum enzymes: Secondary | ICD-10-CM

## 2020-05-07 DIAGNOSIS — Z1211 Encounter for screening for malignant neoplasm of colon: Secondary | ICD-10-CM

## 2020-05-07 DIAGNOSIS — K2270 Barrett's esophagus without dysplasia: Secondary | ICD-10-CM

## 2020-05-07 DIAGNOSIS — K219 Gastro-esophageal reflux disease without esophagitis: Secondary | ICD-10-CM | POA: Diagnosis not present

## 2020-05-07 MED ORDER — NA SULFATE-K SULFATE-MG SULF 17.5-3.13-1.6 GM/177ML PO SOLN: 1.0000 | Freq: Once | ORAL | 0 refills | Status: AC

## 2020-05-07 NOTE — Progress Notes (Signed)
John [name] [address]  [place]  [place], [place] [address]  Main: [phone]  Fax: [phone]   Gastroenterology Consultation  Referring Provider:     Tyler Pita, MD Primary Care Physician:  John [name], Provider Not In Reason for Consultation:   History of Barrett's esophagus        HPI:    Chief Complaint  Patient presents with  . New Patient (Initial Visit)    Hx of Barrett's esophagus    John [name] is a 50 y.o. y/o male referred for consultation & management  by Dr. Estanislado Spire, Provider Not In.  Patient with previous history of hiatal hernia, Nissen fundoplication, presents with worsening reflux.  Patient reports being on Nexium prior to and even after he was fundoplication.  He reports history of Barrett's esophagus and that he was supposed to follow-up for repeat upper endoscopy after the last one but was unable to do so due to loss of insurance.  Denies any dysphagia, but continues to have breakthrough reflux symptoms daily.  No nausea or vomiting or abdominal pain.  Also reports history of IBS and states bowel movements vary between 2 loose bowel movements a day, to 1 formed bowel movement a day.  No blood in stool, no family history of colon cancer  Previous records personally reviewed and patient had a manometry in 2015 prior to nissen fundoplication which was normal.  I do not have his previous upper endoscopy notes but an H&P from September 2014 by Dr. Benson Norway mentions history of GERD, with exacerbation with doxycycline use at that time.  As per his notes "A repeat EGD was recently performed and there is no change with his Barrett's esophagus and no evidence of pill-induced esophagitis."  Past Medical History:  Diagnosis Date  . Barrett esophagus   . BPH (benign prostatic hypertrophy)   . Depression   . GERD (gastroesophageal reflux disease)   . H/O hiatal hernia   . History of cardiac murmur as a child   . History of cardiac murmur as a  child   . History of kidney stones   . Hx of chronic cholecystitis    w/ stone  . Hypertension   . Migraine   . OSA on CPAP     Past Surgical History:  Procedure Laterality Date  . APPENDECTOMY  1999  . CHOLECYSTECTOMY  05/02/2016   LAPROSCOPIC  . CHOLECYSTECTOMY N/A 05/02/2016   Procedure: LAPAROSCOPIC CHOLECYSTECTOMY WITH INTRAOPERATIVE CHOLANGIOGRAM;  Surgeon: Mickeal Skinner, MD;  Location: [place];  Service: General;  Laterality: N/A;  . ESOPHAGEAL MANOMETRY N/A 11/12/2012   Procedure: ESOPHAGEAL MANOMETRY (EM);  Surgeon: Beryle Beams, MD;  [facility];  Service: Endoscopy;  Laterality: N/A;  . KNEE ARTHROSCOPY Right 04/18/2014   Procedure: RIGHT ARTHROSCOPY KNEE / PLICA EXCISION;  Surgeon: Johnn Hai, MD;  Location: [place];  Service: Orthopedics;  Laterality: Right;  . LAPAROSCOPIC NISSEN FUNDOPLICATION N/A 8/34/1962   Procedure: LAPAROSCOPIC NISSEN FUNDOPLICATION;  Surgeon: Pedro Earls, MD;  Location: [facility];  Service: General;  Laterality: N/A;  . NASAL SEPTUM SURGERY  2002  . UPPER GI ENDOSCOPY  2013    Prior to Admission medications   Medication Sig Start Date End Date Taking? Authorizing Provider  aluminum-magnesium hydroxide-simethicone (MAALOX) [phone] MG/5ML SUSP Take 30 mLs by mouth 4 (four) times daily -  before meals and at bedtime. 04/23/20  Yes Carrie Mew, MD  amLODipine (NORVASC) 5 MG tablet Take  5 mg by mouth daily. 03/10/20  Yes [provider]  baclofen (LIORESAL) 10 MG tablet Take 10-20 mg by mouth 2 (two) times daily as needed (for headaches).    Yes [provider]  Diclofenac Potassium,Migraine, 50 MG PACK Take 50 mg by mouth 2 (two) times daily as needed. Migraines   Yes [provider]  esomeprazole (NEXIUM) 40 MG capsule Take 40 mg by mouth daily before breakfast.   Yes [provider]  fluticasone (FLOVENT HFA) 110 MCG/ACT inhaler 2 puffs 2 (two) times daily. 02/05/20   Yes [provider]  metoCLOPramide (REGLAN) 10 MG tablet Take 1 tablet (10 mg total) by mouth every 6 (six) hours as needed. 04/23/20  Yes Carrie Mew, MD  naproxen (NAPROSYN) 500 MG tablet Take 1 tablet (500 mg total) by mouth 2 (two) times daily with a meal. 02/09/20  Yes Carrie Mew, MD  ondansetron (ZOFRAN ODT) 4 MG disintegrating tablet Take 1 tablet (4 mg total) by mouth every 8 (eight) hours as needed for nausea or vomiting. 02/09/20  Yes Carrie Mew, MD  PROVENTIL HFA 108 [phone] Base) MCG/ACT inhaler Inhale 2 puff using inhaler every six hours as needed for breathing 12/06/19  Yes [provider]  famotidine (PEPCID) 20 MG tablet Take 1 tablet (20 mg total) by mouth 2 (two) times daily for 7 days. 04/23/20 04/30/20  Carrie Mew, MD  HYDROcodone-acetaminophen (NORCO/VICODIN) 5-325 MG tablet Take 1-2 tablets by mouth every 4 (four) hours as needed for moderate pain. 05/03/16   Focht, Fraser Din, PA  lamoTRIgine (LAMICTAL) 200 MG tablet Take 200 mg by mouth every evening.     [provider]    Family History  Problem Relation Age of Onset  . Cancer Father        skin  . Diabetes Father   . Cancer Mother        pancreatic 2010  . Hypertension Mother   . Cancer Paternal Aunt        breast     Social History   Tobacco Use  . Smoking status: Never Smoker  . Smokeless tobacco: Never Used  Substance Use Topics  . Alcohol use: No  . Drug use: No    Allergies as of 05/07/2020 - Review Complete 05/07/2020  Allergen Reaction Noted  . Doxycycline Other (See Comments) 08/30/2012  . Nitroglycerin Other (See Comments) 08/30/2012    Review of Systems:    All systems reviewed and negative except where noted in HPI.   Physical Exam:  BP 139/89 (BP Location: Left Arm, Patient Position: Sitting, Cuff Size: Large)   Pulse 75   Ht 5\' 8"  (1.727 m)   Wt 204 lb 12.8 oz (92.9 kg)   BMI 31.14 kg/m  No LMP for male patient. Psych:  Alert and  cooperative. Normal mood and affect. General:   Alert,  Well-developed, well-nourished, pleasant and cooperative in NAD Head:  Normocephalic and atraumatic. Eyes:  Sclera clear, no icterus.   Conjunctiva pink. Ears:  Normal auditory acuity. Nose:  No deformity, discharge, or lesions. Mouth:  No deformity or lesions,oropharynx pink & moist. Neck:  Supple; no masses or thyromegaly. Abdomen:  Normal bowel sounds.  No bruits.  Soft, non-tender and non-distended without masses, hepatosplenomegaly or hernias noted.  No guarding or rebound tenderness.    Msk:  Symmetrical without gross deformities. Good, equal movement & strength bilaterally. Pulses:  Normal pulses noted. Extremities:  No clubbing or edema.  No cyanosis. Neurologic:  Alert and  oriented x3;  grossly normal neurologically. Skin:  Intact without significant lesions or rashes. No jaundice. Lymph Nodes:  No significant cervical adenopathy. Psych:  Alert and cooperative. Normal mood and affect.   Labs: CBC    Component Value Date/Time   WBC 6.3 04/23/2020 0852   RBC 5.20 04/23/2020 0852   HGB 15.5 04/23/2020 0852   HGB 14.4 04/12/2011 1150   HCT 46.2 04/23/2020 0852   HCT 42.3 04/12/2011 1150   PLT 295 04/23/2020 0852   PLT 266 04/12/2011 1150   MCV 88.8 04/23/2020 0852   MCV 89 04/12/2011 1150   MCH 29.8 04/23/2020 0852   MCHC 33.5 04/23/2020 0852   RDW 13.0 04/23/2020 0852   RDW 12.5 04/12/2011 1150   LYMPHSABS 2.6 09/01/2012 0515   MONOABS 0.5 09/01/2012 0515   EOSABS 0.2 09/01/2012 0515   BASOSABS 0.0 09/01/2012 0515   CMP     Component Value Date/Time   NA 140 04/23/2020 0852   NA 141 04/12/2011 1150   K 3.7 04/23/2020 0852   K 4.1 04/12/2011 1150   CL 104 04/23/2020 0852   CL 105 04/12/2011 1150   CO2 28 04/23/2020 0852   CO2 31 04/12/2011 1150   GLUCOSE 102 (H) 04/23/2020 0852   GLUCOSE 97 04/12/2011 1150   BUN 13 04/23/2020 0852   BUN 13 04/12/2011 1150   CREATININE 0.98 04/23/2020 0852    CREATININE 1.34 09/13/2012 1036   CALCIUM 9.1 04/23/2020 0852   CALCIUM 8.8 04/12/2011 1150   PROT 7.3 04/23/2020 0852   PROT 7.3 04/12/2011 1150   ALBUMIN 4.5 04/23/2020 0852   ALBUMIN 4.1 04/12/2011 1150   AST 35 04/23/2020 0852   AST 37 04/12/2011 1150   ALT 48 (H) 04/23/2020 0852   ALT 73 04/12/2011 1150   ALKPHOS 100 04/23/2020 0852   ALKPHOS 103 04/12/2011 1150   BILITOT 1.0 04/23/2020 0852   BILITOT 0.5 04/12/2011 1150   GFRNONAA >60 04/23/2020 0852   GFRNONAA >60 04/12/2011 1150   GFRAA >60 05/02/2016 0703   GFRAA >60 04/12/2011 1150    Imaging Studies: DG Chest 2 View  Result Date: 04/23/2020 CLINICAL DATA:  Palpitations and chest pain. EXAM: CHEST - 2 VIEW COMPARISON:  Chest radiograph 02/09/2020. CT chest March 24, 2020. FINDINGS: The heart size and mediastinal contours are within normal limits. Both lungs are clear. Resolution of the previously seen left midlung opacity. No visible pleural effusions or pneumothorax. No acute osseous abnormality. IMPRESSION: No radiographic evidence of acute cardiopulmonary disease. Electronically Signed   By: Margaretha Sheffield MD   On: 04/23/2020 09:29   SLEEP STUDY DOCUMENTS  Result Date: 04/14/2020 Ordered by an unspecified provider.   Assessment and Plan:   LUIAN SCHUMPERT [name] is a 50 y.o. y/o male has been referred for history of Barrett's esophagus and GERD  I do not have his previous upper endoscopy report but his previous GI note reported history of Barrett's  He is due for barretts surveillance and screening colonoscopy  Patient educated extensively on acid reflux lifestyle modification, including buying a bed wedge, not eating 3 hrs before bedtime, diet modifications, and handout given for the same.   I have discussed alternative options, risks & benefits,  which include, but are not limited to, bleeding, infection, perforation,respiratory complication & drug reaction.  The patient agrees with this plan & written  consent will be obtained.    Given recent exacerbation in his symptoms, also question if wrap is intact, will assess during  upper endoscopy  Patient also has mild elevation in transaminases.  Obtain right upper quadrant ultrasound and labs. Pt advised to avoid hepatotoxic drugs including alcohol and any OTC supplements without discussing with a provider   Dr John [name]  Speech recognition software was used to dictate the above note.

## 2020-05-08 ENCOUNTER — Telehealth: Payer: Self-pay

## 2020-05-08 NOTE — Telephone Encounter (Signed)
Called to inform patient his ultrasound has been scheduled for 06/04/20 at 9am. He will need to arrive at 8:30am checking in at radiology desk. Informed pt nothing to eat or drink after midnight. Pt verbalized understanding.

## 2020-05-10 LAB — HEPATITIS B SURFACE ANTIBODY,QUALITATIVE: Hep B Surface Ab, Qual: NONREACTIVE

## 2020-05-10 LAB — HEPATITIS A ANTIBODY, TOTAL: hep A Total Ab: NEGATIVE

## 2020-05-10 LAB — IRON AND TIBC
Iron Saturation: 31 % (ref 15–55)
Iron: 107 ug/dL (ref 38–169)
Total Iron Binding Capacity: 349 ug/dL (ref 250–450)
UIBC: 242 ug/dL (ref 111–343)

## 2020-05-10 LAB — IGG: IgG (Immunoglobin G), Serum: 993 mg/dL (ref 603–1613)

## 2020-05-10 LAB — ANA: ANA Titer 1: NEGATIVE

## 2020-05-10 LAB — MITOCHONDRIAL/SMOOTH MUSCLE AB PNL
Mitochondrial Ab: <20 Units (ref 0.0–20.0)
Smooth Muscle Ab: 11 Units (ref 0–19)

## 2020-05-10 LAB — HEPATITIS B SURFACE ANTIGEN: Hepatitis B Surface Ag: NEGATIVE

## 2020-05-10 LAB — CERULOPLASMIN: Ceruloplasmin: 18.8 mg/dL (ref 16.0–31.0)

## 2020-05-10 LAB — ANTI-MICROSOMAL ANTIBODY LIVER / KIDNEY: LKM1 Ab: 0.3 Units (ref 0.0–20.0)

## 2020-05-10 LAB — HEPATITIS C ANTIBODY: Hep C Virus Ab: <0.1 s/co ratio (ref 0.0–0.9)

## 2020-05-10 LAB — HEPATITIS B CORE ANTIBODY, TOTAL: Hep B Core Total Ab: NEGATIVE

## 2020-05-10 LAB — FERRITIN: Ferritin: 56 ng/mL (ref 30–400)

## 2020-05-12 ENCOUNTER — Telehealth (INDEPENDENT_AMBULATORY_CARE_PROVIDER_SITE_OTHER): Payer: Medicare Other | Admitting: Pulmonary Disease

## 2020-05-12 DIAGNOSIS — G4733 Obstructive sleep apnea (adult) (pediatric): Secondary | ICD-10-CM | POA: Diagnosis not present

## 2020-05-12 NOTE — Telephone Encounter (Signed)
Per Dr. Elsworth Soho, he has severe OSA see below.  He needs to have appointment with Dr. Halford Chessman and be followed at sleep medicine.  His main issues are related to sleep with no active pulmonary issues.  Severe OSA 81/h Needed BiPAP 15/10 , could not tolerate CPAP  Recommend auto biPAP IPAP max 16 , EPAP min 8, PS +5 Large F20 FF mask Please corelate clinically with download   per Dr. Elsworth Soho   Please send auto BiPAP request to DME of choice with the above settings/mask.

## 2020-05-12 NOTE — Addendum Note (Signed)
Addended by: Claudette Head A on: 05/12/2020 11:54 AM   Modules accepted: Orders

## 2020-05-12 NOTE — Progress Notes (Signed)
Thank you, findings noted.

## 2020-05-12 NOTE — Telephone Encounter (Signed)
Severe OSA 81/h Needed BiPAP 15/10 , could not tolerate CPAP  Recommend auto biPAP IPAP max 16 , EPAP min 8, PS +5 Large F20 FF mask Please corelate clinically with download

## 2020-05-12 NOTE — Telephone Encounter (Signed)
Patient is aware of results and voiced her understanding.  Patient is agreeable with bipap. Order has been placed.  OV scheduled for 07/09/2020 at 11:00 with Dr. Halford Chessman.   Nothing further is needed at this time.

## 2020-05-13 ENCOUNTER — Other Ambulatory Visit: Payer: Self-pay

## 2020-05-13 ENCOUNTER — Other Ambulatory Visit
Admission: RE | Admit: 2020-05-13 | Discharge: 2020-05-13 | Disposition: A | Discharge status: Home / Self Care | Payer: Medicare Other | Source: Ambulatory Visit | Attending: Gastroenterology | Admitting: Gastroenterology

## 2020-05-13 DIAGNOSIS — Z01812 Encounter for preprocedural laboratory examination: Secondary | ICD-10-CM | POA: Diagnosis present

## 2020-05-13 DIAGNOSIS — Z20822 Contact with and (suspected) exposure to covid-19: Secondary | ICD-10-CM | POA: Insufficient documentation

## 2020-05-13 LAB — SARS CORONAVIRUS 2 (TAT 6-24 HRS): SARS Coronavirus 2: NEGATIVE

## 2020-05-14 ENCOUNTER — Encounter: Payer: Self-pay | Admitting: Gastroenterology

## 2020-05-15 ENCOUNTER — Other Ambulatory Visit: Payer: Self-pay

## 2020-05-15 ENCOUNTER — Ambulatory Visit
Admission: RE | Admit: 2020-05-15 | Discharge: 2020-05-15 | Disposition: A | Discharge status: Home / Self Care | Payer: Medicare Other | Attending: Gastroenterology | Admitting: Gastroenterology

## 2020-05-15 ENCOUNTER — Encounter: Payer: Self-pay | Admitting: Gastroenterology

## 2020-05-15 ENCOUNTER — Encounter
Admission: RE | Disposition: A | Discharge status: Home / Self Care | Payer: Self-pay | Source: Home / Self Care | Attending: Gastroenterology

## 2020-05-15 ENCOUNTER — Ambulatory Visit: Payer: Medicare Other | Admitting: Registered Nurse

## 2020-05-15 DIAGNOSIS — Z79899 Other long term (current) drug therapy: Secondary | ICD-10-CM | POA: Insufficient documentation

## 2020-05-15 DIAGNOSIS — D125 Benign neoplasm of sigmoid colon: Secondary | ICD-10-CM | POA: Insufficient documentation

## 2020-05-15 DIAGNOSIS — K317 Polyp of stomach and duodenum: Secondary | ICD-10-CM | POA: Diagnosis not present

## 2020-05-15 DIAGNOSIS — K529 Noninfective gastroenteritis and colitis, unspecified: Secondary | ICD-10-CM | POA: Insufficient documentation

## 2020-05-15 DIAGNOSIS — R1310 Dysphagia, unspecified: Secondary | ICD-10-CM | POA: Diagnosis present

## 2020-05-15 DIAGNOSIS — K319 Disease of stomach and duodenum, unspecified: Secondary | ICD-10-CM

## 2020-05-15 DIAGNOSIS — R1319 Other dysphagia: Secondary | ICD-10-CM

## 2020-05-15 DIAGNOSIS — K52839 Microscopic colitis, unspecified: Secondary | ICD-10-CM | POA: Diagnosis not present

## 2020-05-15 DIAGNOSIS — Z9889 Other specified postprocedural states: Secondary | ICD-10-CM | POA: Insufficient documentation

## 2020-05-15 DIAGNOSIS — Z8719 Personal history of other diseases of the digestive system: Secondary | ICD-10-CM | POA: Diagnosis not present

## 2020-05-15 DIAGNOSIS — K635 Polyp of colon: Secondary | ICD-10-CM

## 2020-05-15 DIAGNOSIS — K2950 Unspecified chronic gastritis without bleeding: Secondary | ICD-10-CM | POA: Diagnosis not present

## 2020-05-15 DIAGNOSIS — K2270 Barrett's esophagus without dysplasia: Secondary | ICD-10-CM

## 2020-05-15 DIAGNOSIS — Z1211 Encounter for screening for malignant neoplasm of colon: Secondary | ICD-10-CM | POA: Insufficient documentation

## 2020-05-15 DIAGNOSIS — D122 Benign neoplasm of ascending colon: Secondary | ICD-10-CM | POA: Insufficient documentation

## 2020-05-15 DIAGNOSIS — K3189 Other diseases of stomach and duodenum: Secondary | ICD-10-CM | POA: Diagnosis not present

## 2020-05-15 DIAGNOSIS — R109 Unspecified abdominal pain: Secondary | ICD-10-CM

## 2020-05-15 DIAGNOSIS — Z888 Allergy status to other drugs, medicaments and biological substances status: Secondary | ICD-10-CM | POA: Insufficient documentation

## 2020-05-15 DIAGNOSIS — Z791 Long term (current) use of non-steroidal anti-inflammatories (NSAID): Secondary | ICD-10-CM | POA: Insufficient documentation

## 2020-05-15 DIAGNOSIS — Z881 Allergy status to other antibiotic agents status: Secondary | ICD-10-CM | POA: Diagnosis not present

## 2020-05-15 DIAGNOSIS — K2100 Gastro-esophageal reflux disease with esophagitis, without bleeding: Secondary | ICD-10-CM | POA: Insufficient documentation

## 2020-05-15 DIAGNOSIS — R748 Abnormal levels of other serum enzymes: Secondary | ICD-10-CM

## 2020-05-15 HISTORY — PX: ESOPHAGOGASTRODUODENOSCOPY (EGD) WITH PROPOFOL: SHX5813

## 2020-05-15 HISTORY — PX: COLONOSCOPY WITH PROPOFOL: SHX5780

## 2020-05-15 SURGERY — COLONOSCOPY WITH PROPOFOL
Anesthesia: General

## 2020-05-15 MED ORDER — SODIUM CHLORIDE 0.9 % IV SOLN
INTRAVENOUS | Status: DC
  Administered 2020-05-15: 20 mL/h via INTRAVENOUS

## 2020-05-15 MED ORDER — EPHEDRINE SULFATE 50 MG/ML IJ SOLN
INTRAMUSCULAR | Status: DC | PRN
  Administered 2020-05-15: 5 mg via INTRAVENOUS
  Administered 2020-05-15 (×2): 10 mg via INTRAVENOUS

## 2020-05-15 MED ORDER — GLYCOPYRROLATE 0.2 MG/ML IJ SOLN
INTRAMUSCULAR | Status: DC | PRN
  Administered 2020-05-15: .2 mg via INTRAVENOUS

## 2020-05-15 MED ORDER — PROPOFOL 500 MG/50ML IV EMUL
INTRAVENOUS | Status: DC | PRN
  Administered 2020-05-15: 175 ug/kg/min via INTRAVENOUS

## 2020-05-15 MED ORDER — DEXMEDETOMIDINE HCL 200 MCG/2ML IV SOLN
INTRAVENOUS | Status: DC | PRN
  Administered 2020-05-15: 20 ug via INTRAVENOUS

## 2020-05-15 MED ORDER — EPHEDRINE 5 MG/ML INJ
INTRAVENOUS | Status: AC
  Filled 2020-05-15: qty 10

## 2020-05-15 MED ORDER — PROPOFOL 500 MG/50ML IV EMUL
INTRAVENOUS | Status: AC
  Filled 2020-05-15: qty 50

## 2020-05-15 MED ORDER — LIDOCAINE HCL (CARDIAC) PF 100 MG/5ML IV SOSY
PREFILLED_SYRINGE | INTRAVENOUS | Status: DC | PRN
  Administered 2020-05-15: 100 mg via INTRAVENOUS

## 2020-05-15 MED ORDER — PROPOFOL 10 MG/ML IV BOLUS
INTRAVENOUS | Status: DC | PRN
  Administered 2020-05-15: 100 mg via INTRAVENOUS

## 2020-05-15 MED ORDER — PHENYLEPHRINE HCL (PRESSORS) 10 MG/ML IV SOLN
INTRAVENOUS | Status: DC | PRN
  Administered 2020-05-15 (×3): 100 ug via INTRAVENOUS

## 2020-05-15 NOTE — Transfer of Care (Signed)
Immediate Anesthesia Transfer of Care Note  Patient: [name]  Procedure(s) Performed: COLONOSCOPY WITH PROPOFOL (N/A ) ESOPHAGOGASTRODUODENOSCOPY (EGD) WITH PROPOFOL (N/A )  Patient Location: Endoscopy Unit  Anesthesia Type:General  Level of Consciousness: drowsy  Airway & Oxygen Therapy: Patient Spontanous Breathing  Post-op Assessment: Report given to RN and Post -op Vital signs reviewed and stable  Post vital signs: Reviewed and stable  Last Vitals:  Vitals Value Taken Time  BP 132/71 05/15/20 1233  Temp    Pulse 92 05/15/20 1234  Resp 15 05/15/20 1234  SpO2 93 % 05/15/20 1234  Vitals shown include unvalidated device data.  Last Pain:  Vitals:   05/15/20 0935  TempSrc: Temporal  PainSc: 4          Complications: No complications documented.

## 2020-05-15 NOTE — Op Note (Signed)
[facility]enterology Patient Name: John [name] Procedure Date: 05/15/2020 11:39 AM MRN: [id number] Account #: [id number] Date of Birth: 1970/12/25 Admit Type: Outpatient Age: 50 Room: [facility] ENDO ROOM 2 Gender: Male Note Status: Finalized Procedure:             Colonoscopy Indications:           Screening for colorectal malignant neoplasm,                         Incidental - Diarrhea Providers:             Kathie Posa B. Bonna Gains MD, MD Referring MD:          Forest Gleason Md, MD (Referring MD) Medicines:             Monitored Anesthesia Care Complications:         No immediate complications. Procedure:             Pre-Anesthesia Assessment:                        - ASA Grade Assessment: II - A patient with mild                         systemic disease.                        - Prior to the procedure, a History and Physical was                         performed, and patient medications, allergies and                         sensitivities were reviewed. The patient's tolerance                         of previous anesthesia was reviewed.                        - The risks and benefits of the procedure and the                         sedation options and risks were discussed with the                         patient. All questions were answered and informed                         consent was obtained.                        - Patient identification and proposed procedure were                         verified prior to the procedure by the physician, the                         nurse, the anesthesiologist, the anesthetist and the                         technician. The procedure was verified  in the                         procedure room.                        After obtaining informed consent, the colonoscope was                         passed under direct vision. Throughout the procedure,                         the patient's blood pressure, pulse, and oxygen                          saturations were monitored continuously. The                         Colonoscope was introduced through the anus and                         advanced to the the cecum, identified by appendiceal                         orifice and ileocecal valve. The colonoscopy was                         performed with ease. The patient tolerated the                         procedure well. The quality of the bowel preparation                         was fair. Findings:      The perianal and digital rectal examinations were normal.      Two sessile polyps were found in the sigmoid colon and ascending colon.       The polyps were 5 mm in size. These polyps were removed with a cold       snare. Resection and retrieval were complete.      The exam was otherwise without abnormality.      The rectum, sigmoid colon, descending colon, transverse colon, ascending       colon and cecum appeared normal. Biopsies for histology were taken with       a cold forceps from the entire colon for evaluation of microscopic       colitis.      The retroflexed view of the distal rectum and anal verge was normal and       showed no anal or rectal abnormalities. Impression:            - Preparation of the colon was fair.                        - Two 5 mm polyps in the sigmoid colon and in the                         ascending colon, removed with a cold snare. Resected  and retrieved.                        - The examination was otherwise normal.                        - The rectum, sigmoid colon, descending colon,                         transverse colon, ascending colon and cecum are                         normal. Biopsied.                        - The distal rectum and anal verge are normal on                         retroflexion view. Recommendation:        - Discharge patient to home (with escort).                        - Advance diet as tolerated.                        -  Continue present medications.                        - Await pathology results.                        - Repeat colonoscopy in 1 year, with 2 day prep,                         because the bowel preparation was suboptimal.                        - The findings and recommendations were discussed with                         the patient.                        - The findings and recommendations were discussed with                         the patient's family.                        - Return to primary care physician as previously                         scheduled. Procedure Code(s):     --- Professional ---                        [phone], Colonoscopy, flexible; with removal of                         tumor(s), polyp(s), or other lesion(s) by snare  technique                        45380, 59, Colonoscopy, flexible; with biopsy, single                         or multiple Diagnosis Code(s):     --- Professional ---                        Z12.11, Encounter for screening for malignant neoplasm                         of colon                        K63.5, Polyp of colon CPT copyright 2019 American Medical Association. All rights reserved. The codes documented in this report are preliminary and upon coder review may  be revised to meet current compliance requirements.  Vonda Antigua, MD Margretta Sidle B. Bonna Gains MD, MD 05/15/2020 12:31:22 PM This report has been signed electronically. Number of Addenda: 0 Note Initiated On: 05/15/2020 11:39 AM Scope Withdrawal Time: 0 hours 14 minutes 3 seconds  Total Procedure Duration: 0 hours 15 minutes 29 seconds  Estimated Blood Loss:  Estimated blood loss: none.      [facility]

## 2020-05-15 NOTE — Anesthesia Preprocedure Evaluation (Signed)
Anesthesia Evaluation  Patient identified by MRN, date of birth, ID band Patient awake    Reviewed: Allergy & Precautions, NPO status , Patient's Chart, lab work & pertinent test results  History of Anesthesia Complications Negative for: history of anesthetic complications  Airway Mallampati: II  TM Distance: >3 FB Neck ROM: Full    Dental no notable dental hx.    Pulmonary sleep apnea ,    breath sounds clear to auscultation- rhonchi (-) wheezing      Cardiovascular Exercise Tolerance: Good hypertension, Pt. on medications (-) CAD, (-) Past MI, (-) Cardiac Stents and (-) CABG  Rhythm:Regular Rate:Normal - Systolic murmurs and - Diastolic murmurs    Neuro/Psych  Headaches, neg Seizures PSYCHIATRIC DISORDERS Depression    GI/Hepatic Neg liver ROS, hiatal hernia, GERD  ,  Endo/Other  negative endocrine ROSneg diabetes  Renal/GU negative Renal ROS     Musculoskeletal negative musculoskeletal ROS (+)   Abdominal (+) + obese,   Peds  Hematology negative hematology ROS (+)   Anesthesia Other Findings Past Medical History: No date: Barrett esophagus No date: BPH (benign prostatic hypertrophy) No date: Depression No date: GERD (gastroesophageal reflux disease) No date: H/O hiatal hernia No date: History of cardiac murmur as a child No date: History of cardiac murmur as a child No date: History of kidney stones No date: Hx of chronic cholecystitis     Comment:  w/ stone No date: Hypertension No date: Migraine No date: OSA on CPAP   Reproductive/Obstetrics                             Anesthesia Physical Anesthesia Plan  ASA: II  Anesthesia Plan: General   Post-op Pain Management:    Induction: Intravenous  PONV Risk Score and Plan: 1 and Propofol infusion  Airway Management Planned: Natural Airway  Additional Equipment:   Intra-op Plan:   Post-operative Plan:   Informed  Consent: I have reviewed the patients History and Physical, chart, labs and discussed the procedure including the risks, benefits and alternatives for the proposed anesthesia with the patient or authorized representative who has indicated his/her understanding and acceptance.     Dental advisory given  Plan Discussed with: CRNA and Anesthesiologist  Anesthesia Plan Comments:         Anesthesia Quick Evaluation

## 2020-05-15 NOTE — H&P (Signed)
John Antigua, MD [address], [place], [place], [place], [address] [id number], [place], [place], [place], [address] Phone: [phone]  Fax: [phone]  Primary Care Physician:  System, Provider Not In   Pre-Procedure History & Physical: HPI:  John [name] is a 50 y.o. male is here for a colonoscopy and EGD.   Past Medical History:  Diagnosis Date  . Barrett esophagus   . BPH (benign prostatic hypertrophy)   . Depression   . GERD (gastroesophageal reflux disease)   . H/O hiatal hernia   . History of cardiac murmur as a child   . History of cardiac murmur as a child   . History of kidney stones   . Hx of chronic cholecystitis    w/ stone  . Hypertension   . Migraine   . OSA on CPAP     Past Surgical History:  Procedure Laterality Date  . APPENDECTOMY  1999  . CHOLECYSTECTOMY  05/02/2016   LAPROSCOPIC  . CHOLECYSTECTOMY N/A 05/02/2016   Procedure: LAPAROSCOPIC CHOLECYSTECTOMY WITH INTRAOPERATIVE CHOLANGIOGRAM;  Surgeon: Mickeal Skinner, MD;  Location: [facility];  Service: General;  Laterality: N/A;  . ESOPHAGEAL MANOMETRY N/A 11/12/2012   Procedure: ESOPHAGEAL MANOMETRY (EM);  Surgeon: Beryle Beams, MD;  Location: [facility];  Service: Endoscopy;  Laterality: N/A;  . KNEE ARTHROSCOPY Right 04/18/2014   Procedure: RIGHT ARTHROSCOPY KNEE / PLICA EXCISION;  Surgeon: Johnn Hai, MD;  Location: [facility];  Service: Orthopedics;  Laterality: Right;  . LAPAROSCOPIC NISSEN FUNDOPLICATION N/A 5/70/1779   Procedure: LAPAROSCOPIC NISSEN FUNDOPLICATION;  Surgeon: Pedro Earls, MD;  Location: [facility];  Service: General;  Laterality: N/A;  . NASAL SEPTUM SURGERY  2002  . UPPER GI ENDOSCOPY  2013    Prior to Admission medications   Medication Sig Start Date End Date Taking? Authorizing Provider  aluminum-magnesium hydroxide-simethicone (MAALOX) [phone] MG/5ML SUSP Take 30 mLs by mouth 4 (four) times daily -  before meals and at  bedtime. 04/23/20  Yes Carrie Mew, MD  amLODipine (NORVASC) 5 MG tablet Take 5 mg by mouth daily. 03/10/20  Yes [provider]  baclofen (LIORESAL) 10 MG tablet Take 10-20 mg by mouth 2 (two) times daily as needed (for headaches).    Yes [provider]  Diclofenac Potassium,Migraine, 50 MG PACK Take 50 mg by mouth 2 (two) times daily as needed. Migraines   Yes [provider]  esomeprazole (NEXIUM) 40 MG capsule Take 40 mg by mouth daily before breakfast.   Yes [provider]  fluticasone (FLOVENT HFA) 110 MCG/ACT inhaler 2 puffs 2 (two) times daily. 02/05/20  Yes [provider]  lamoTRIgine (LAMICTAL) 200 MG tablet Take 200 mg by mouth every evening.    Yes [provider]  metoCLOPramide (REGLAN) 10 MG tablet Take 1 tablet (10 mg total) by mouth every 6 (six) hours as needed. 04/23/20  Yes Carrie Mew, MD  naproxen (NAPROSYN) 500 MG tablet Take 1 tablet (500 mg total) by mouth 2 (two) times daily with a meal. 02/09/20  Yes Carrie Mew, MD  ondansetron (ZOFRAN ODT) 4 MG disintegrating tablet Take 1 tablet (4 mg total) by mouth every 8 (eight) hours as needed for nausea or vomiting. 02/09/20  Yes Carrie Mew, MD  PROVENTIL HFA 108 [phone] Base) MCG/ACT inhaler Inhale 2 puff using inhaler every six hours as needed for breathing 12/06/19  Yes [provider]  famotidine (PEPCID) 20 MG tablet Take 1 tablet (20 mg  total) by mouth 2 (two) times daily for 7 days. 04/23/20 04/30/20  Carrie Mew, MD  HYDROcodone-acetaminophen (NORCO/VICODIN) 5-325 MG tablet Take 1-2 tablets by mouth every 4 (four) hours as needed for moderate pain. 05/03/16   Kalman Drape, PA    Allergies as of 05/07/2020 - Review Complete 05/07/2020  Allergen Reaction Noted  . Doxycycline Other (See Comments) 08/30/2012  . Nitroglycerin Other (See Comments) 08/30/2012    Family History  Problem Relation Age of Onset  . Cancer Father         skin  . Diabetes Father   . Cancer Mother        pancreatic 2010  . Hypertension Mother   . Cancer Paternal Aunt        breast    Social History   Socioeconomic History  . Marital status: Legally Separated    Spouse name: Not on file  . Number of children: 1  . Years of education: 14  . Highest education level: Not on file  Occupational History  . Occupation: Disabled    Comment: Chronic Headaches  Tobacco Use  . Smoking status: Never Smoker  . Smokeless tobacco: Never Used  Substance and Sexual Activity  . Alcohol use: No  . Drug use: No  . Sexual activity: Not on file  Other Topics Concern  . Not on file  Social History Narrative   Lenell "Ed" grew up in [place]. He lives at home with his wife and daughter. He used to work in [employer]. Now he is disabled 2/2 severe, chronic headaches. He enjoys playing video games occasional with his daughter. Enjoys watching movies.      No caffeine   No smoking   Does not exercise   Social Determinants of Health   Financial Resource Strain: Not on file  Food Insecurity: Not on file  Transportation Needs: Not on file  Physical Activity: Not on file  Stress: Not on file  Social Connections: Not on file  Intimate Partner Violence: Not on file    Review of Systems: See HPI, otherwise negative ROS  Physical Exam: BP (!) 147/100   Pulse 73   Temp 98 F (36.7 C) (Temporal)   Resp 18   Ht 5\' 8"  (1.727 m)   Wt 93 kg   SpO2 (!) 1%   BMI 31.17 kg/m  General:   Alert,  pleasant and cooperative in NAD Head:  Normocephalic and atraumatic. Neck:  Supple; no masses or thyromegaly. Lungs:  Clear throughout to auscultation, normal respiratory effort.    Heart:  +S1, +S2, Regular rate and rhythm, No edema. Abdomen:  Soft, nontender and nondistended. Normal bowel sounds, without guarding, and without rebound.   Neurologic:  Alert and  oriented x4;  grossly normal neurologically.  Impression/Plan: John [name]  is here for a colonoscopy to be performed for average risk screening and EGD for Acid Reflux, barretts surveillance.  Risks, benefits, limitations, and alternatives regarding the procedures have been reviewed with the patient.  Questions have been answered.  All parties agreeable.   Virgel Manifold, MD  05/15/2020, 10:57 AM

## 2020-05-15 NOTE — Op Note (Addendum)
[facility]astroenterology Patient Name: John [name] Procedure Date: 05/15/2020 11:40 AM MRN: [id number] Account #: [id number] Date of Birth: [birth year] Admit Type: Outpatient Age: 50 Room: [facility] ENDO ROOM 2 Gender: Male Note Status: Finalized Procedure:             Upper GI endoscopy Indications:           Abdominal pain, Dysphagia, Follow-up of Barrett's                         esophagus Providers:             Skippy Marhefka B. Bonna Gains MD, MD Referring MD:          Forest Gleason Md, MD (Referring MD) Medicines:             Monitored Anesthesia Care Complications:         No immediate complications. Procedure:             Pre-Anesthesia Assessment:                        - The risks and benefits of the procedure and the                         sedation options and risks were discussed with the                         patient. All questions were answered and informed                         consent was obtained.                        - Patient identification and proposed procedure were                         verified prior to the procedure.                        - ASA Grade Assessment: II - A patient with mild                         systemic disease.                        After obtaining informed consent, the endoscope was                         passed under direct vision. Throughout the procedure,                         the patient's blood pressure, pulse, and oxygen                         saturations were monitored continuously. The Endoscope                         was introduced through the mouth, and advanced to the                         second part of duodenum. The upper  GI endoscopy was                         accomplished with ease. The patient tolerated the                         procedure well. Findings:      Tongues of salmon-colored mucosa were present. No other visible       abnormalities were present. The maximum longitudinal extent of these        esophageal mucosal changes was 2 cm in length. Mucosa was biopsied with       a cold forceps for histology in a targeted manner and in 4 quadrants.       One specimen bottle was sent to pathology. Biopsies were obtained from       the proximal and distal esophagus with cold forceps for histology of       suspected eosinophilic esophagitis.      The exam of the esophagus was otherwise normal.      Patchy mildly erythematous mucosa without bleeding was found in the       gastric antrum. Biopsies were obtained in the gastric body, at the       incisura and in the gastric antrum with cold forceps for Helicobacter       pylori testing.      A few 4 to 5 mm sessile polyps with no bleeding and no stigmata of       recent bleeding were found in the stomach. Biopsies were taken with a       cold forceps for histology.      The exam of the stomach was otherwise normal.      Evidence of a fundoplication was found in the gastric fundus. The wrap       appeared intact.      Patchy mild mucosal changes characterized by atrophy and discoloration       were found in the second portion of the duodenum. Biopsies were taken       with a cold forceps for histology.      Nodular mucosa was found in the duodenal bulb. Biopsies were taken with       a cold forceps for histology.      The exam of the duodenum was otherwise normal. Impression:            - Salmon-colored mucosa suspicious for short-segment                         Barrett's esophagus. Biopsied.                        - Erythematous mucosa in the antrum.                        - A few gastric polyps. Biopsied.                        - A fundoplication was found. The wrap appears intact.                        - Mucosal changes in the duodenum. Biopsied.                        -  Nodular mucosa in the duodenal bulb. Biopsied.                        - Biopsies were obtained in the gastric body, at the                         incisura and in the  gastric antrum. Recommendation:        - Await pathology results.                        - Discharge patient to home.                        - Continue present medications.                        - Resume previous diet.                        - The findings and recommendations were discussed with                         the patient.                        - The findings and recommendations were discussed with                         the patient's family.                        - Return to primary care physician as previously                         scheduled.                        - Follow an antireflux regimen. Procedure Code(s):     --- Professional ---                        [phone], Esophagogastroduodenoscopy, flexible,                         transoral; with biopsy, single or multiple Diagnosis Code(s):     --- Professional ---                        K22.70, Barrett's esophagus without dysplasia                        K31.89, Other diseases of stomach and duodenum                        K31.7, Polyp of stomach and duodenum                        Z98.890, Other specified postprocedural states                        R10.9, Unspecified abdominal pain  R13.10, Dysphagia, unspecified CPT copyright 2019 American Medical Association. All rights reserved. The codes documented in this report are preliminary and upon coder review may  be revised to meet current compliance requirements.  Vonda Antigua, MD Margretta Sidle B. Bonna Gains MD, MD 05/15/2020 12:11:15 PM This report has been signed electronically. Number of Addenda: 0 Note Initiated On: 05/15/2020 11:40 AM Estimated Blood Loss:  Estimated blood loss: none.      [facility]

## 2020-05-18 LAB — SURGICAL PATHOLOGY

## 2020-05-18 NOTE — Anesthesia Postprocedure Evaluation (Signed)
Anesthesia Post Note  Patient: John [name]  Procedure(s) Performed: COLONOSCOPY WITH PROPOFOL (N/A ) ESOPHAGOGASTRODUODENOSCOPY (EGD) WITH PROPOFOL (N/A )  Patient location during evaluation: Endoscopy Anesthesia Type: General Level of consciousness: awake and alert and oriented Pain management: pain level controlled Vital Signs Assessment: post-procedure vital signs reviewed and stable Respiratory status: spontaneous breathing, nonlabored ventilation and respiratory function stable Cardiovascular status: blood pressure returned to baseline and stable Postop Assessment: no signs of nausea or vomiting Anesthetic complications: no   No complications documented.   Last Vitals:  Vitals:   05/15/20 1300 05/15/20 1304  BP: (!) 140/97 120/86  Pulse:    Resp:  16  Temp:    SpO2:      Last Pain:  Vitals:   05/16/20 1343  TempSrc:   PainSc: 0-No pain                 [name]

## 2020-05-22 ENCOUNTER — Encounter: Payer: Self-pay | Admitting: Gastroenterology

## 2020-06-04 ENCOUNTER — Ambulatory Visit
Admission: RE | Admit: 2020-06-04 | Discharge: 2020-06-04 | Disposition: A | Discharge status: Home / Self Care | Payer: Medicare Other | Source: Ambulatory Visit | Attending: Gastroenterology | Admitting: Gastroenterology

## 2020-06-04 ENCOUNTER — Other Ambulatory Visit: Payer: Self-pay

## 2020-06-04 DIAGNOSIS — R748 Abnormal levels of other serum enzymes: Secondary | ICD-10-CM | POA: Diagnosis present

## 2020-06-10 ENCOUNTER — Other Ambulatory Visit: Payer: Self-pay | Admitting: Gastroenterology

## 2020-06-10 DIAGNOSIS — R748 Abnormal levels of other serum enzymes: Secondary | ICD-10-CM

## 2020-07-09 ENCOUNTER — Ambulatory Visit (INDEPENDENT_AMBULATORY_CARE_PROVIDER_SITE_OTHER): Payer: Medicare Other | Admitting: Pulmonary Disease

## 2020-07-09 ENCOUNTER — Other Ambulatory Visit: Payer: Self-pay

## 2020-07-09 ENCOUNTER — Encounter: Payer: Self-pay | Admitting: Pulmonary Disease

## 2020-07-09 VITALS — BP 120/88 | HR 85 | Temp 98.4°F | Ht 68.0 in | Wt 203.2 lb

## 2020-07-09 DIAGNOSIS — G4733 Obstructive sleep apnea (adult) (pediatric): Secondary | ICD-10-CM

## 2020-07-09 NOTE — Patient Instructions (Signed)
Follow up in 3 months

## 2020-07-09 NOTE — Progress Notes (Signed)
[facility]  Chief Complaint  Patient presents with  . Follow-up    Try using cpap when can, sob     Constitutional:  BP 120/88 (BP Location: Left Arm, Patient Position: Sitting, Cuff Size: Normal)   Pulse 85   Temp 98.4 F (36.9 C) (Temporal)   Ht 5\' 8"  (1.727 m)   Wt 203 lb 3.2 oz (92.2 kg)   SpO2 95%   BMI 30.90 kg/m   Past Medical History:  GERD, Barrett's Esophagus, BPH, Depression, Hiatal hernia, Nephrolithiasis, Migraine headaches, HTN, COVID 19 infection December 2020  Past Surgical History:  He  has a past surgical history that includes Upper gi endoscopy (2013); Esophageal manometry (N/A, 11/12/2012); Nasal septum surgery (2002); Laparoscopic Nissen fundoplication (N/A, 3/81/0175); Appendectomy (1999); Knee arthroscopy (Right, 04/18/2014); Cholecystectomy (05/02/2016); Cholecystectomy (N/A, 05/02/2016); Colonoscopy with propofol (N/A, 05/15/2020); and Esophagogastroduodenoscopy (egd) with propofol (N/A, 05/15/2020).  Brief Summary:  John [name] is a 50 y.o. male with obstructive sleep apnea.      Subjective:   He was seen by Dr. Patsey Berthold.  He has history of sleep apnea and tried on CPAP before, but had trouble tolerating this.  He had persistent snoring, sleep disruption, apnea and daytime sleepiness.  He had repeat sleep study in February.  Found to have very severe sleep apnea.  During titration portion seemed liked Bipap was best option for him.  This was to be set up through [employer], but he hasn't received equipment yet.  He was seen by neurology who also referred him to [facility] to assess for [facility] device.  This appointment is scheduled for sometime in December.  Physical Exam:   Appearance - well kempt   ENMT - no sinus tenderness, no oral exudate, no LAN, Mallampati 3 airway, no stridor, enlarged tongue, elongated uvula  Respiratory - equal breath sounds bilaterally, no wheezing or rales  CV - s1s2 regular rate and  rhythm, no murmurs  Ext - no clubbing, no edema  Skin - no rashes  Psych - normal mood and affect   Sleep Tests:   PSG 04/10/20 >> AHI 91.4, Spo2 low 67%; Bipap 15/10 cm H2O  Social History:  He  reports that he has never smoked. He has never used smokeless tobacco. He reports that he does not drink alcohol and does not use drugs.  Family History:  His family history includes Cancer in his father, mother, and paternal aunt; Diabetes in his father; Hypertension in his mother.     Assessment/Plan:   Severe obstructive sleep apnea. - reviewed his sleep study results - discussed how sleep apnea can impact his health - discussed treatment options - he is waiting to get Bipap set up through [place] - he is to be set up with auto Bipap - he also has referral to [facility] to discuss Inspire device  Time Spent Involved in Patient Care on Day of Examination:  22 minutes  Follow up:  Patient Instructions  Follow up in 3 months   Medication List:   Allergies as of 07/09/2020      Reactions   Doxycycline Other (See Comments)   "Burning in throat"   Nitroglycerin Other (See Comments)   Severe headache      Medication List       Accurate as of Jul 09, 2020 11:41 AM. If you have any questions, ask your nurse or doctor.        STOP taking these medications   aluminum-magnesium  hydroxide-simethicone [phone] MG/5ML Susp Commonly known as: MAALOX Stopped by: Chesley Mires, MD   famotidine 20 MG tablet Commonly known as: PEPCID Stopped by: Chesley Mires, MD   HYDROcodone-acetaminophen 5-325 MG tablet Commonly known as: NORCO/VICODIN Stopped by: Chesley Mires, MD   lamoTRIgine 200 MG tablet Commonly known as: LAMICTAL Stopped by: Chesley Mires, MD   metoCLOPramide 10 MG tablet Commonly known as: REGLAN Stopped by: Chesley Mires, MD   naproxen 500 MG tablet Commonly known as: Naprosyn Stopped by: Chesley Mires, MD     TAKE these medications   amLODipine 5 MG tablet Commonly  known as: NORVASC Take 5 mg by mouth daily.   baclofen 10 MG tablet Commonly known as: LIORESAL Take 10-20 mg by mouth 2 (two) times daily as needed (for headaches).   cetirizine 10 MG tablet Commonly known as: ZYRTEC Take by mouth.   Diclofenac Potassium(Migraine) 50 MG Pack Take 50 mg by mouth 2 (two) times daily as needed. Migraines   esomeprazole 40 MG capsule Commonly known as: NEXIUM Take 40 mg by mouth daily before breakfast.   Flovent HFA 110 MCG/ACT inhaler Generic drug: fluticasone 2 puffs 2 (two) times daily.   lisinopril 2.5 MG tablet Commonly known as: ZESTRIL Take by mouth.   nortriptyline 10 MG capsule Commonly known as: PAMELOR 20 mg.   ondansetron 4 MG disintegrating tablet Commonly known as: Zofran ODT Take 1 tablet (4 mg total) by mouth every 8 (eight) hours as needed for nausea or vomiting.   Proventil HFA 108 (90 Base) MCG/ACT inhaler Generic drug: albuterol Inhale 2 puff using inhaler every six hours as needed for breathing       Signature:  Chesley Mires, MD [place] Pager - [phone] 07/09/2020, 11:41 AM

## 2020-08-03 DIAGNOSIS — Z8679 Personal history of other diseases of the circulatory system: Secondary | ICD-10-CM | POA: Insufficient documentation

## 2020-08-03 DIAGNOSIS — R519 Headache, unspecified: Secondary | ICD-10-CM | POA: Insufficient documentation

## 2020-08-27 ENCOUNTER — Other Ambulatory Visit: Payer: Self-pay | Admitting: Neurology

## 2020-08-27 ENCOUNTER — Other Ambulatory Visit (HOSPITAL_COMMUNITY): Payer: Self-pay | Admitting: Neurology

## 2020-08-27 DIAGNOSIS — R519 Headache, unspecified: Secondary | ICD-10-CM

## 2020-09-04 ENCOUNTER — Ambulatory Visit
Admission: RE | Admit: 2020-09-04 | Discharge: 2020-09-04 | Disposition: A | Discharge status: Home / Self Care | Payer: Medicare Other | Source: Ambulatory Visit | Attending: Neurology | Admitting: Neurology

## 2020-09-04 ENCOUNTER — Other Ambulatory Visit: Payer: Self-pay

## 2020-09-04 DIAGNOSIS — R519 Headache, unspecified: Secondary | ICD-10-CM | POA: Insufficient documentation

## 2020-10-01 DIAGNOSIS — M542 Cervicalgia: Secondary | ICD-10-CM | POA: Insufficient documentation

## 2020-10-12 ENCOUNTER — Telehealth: Payer: Self-pay | Admitting: Pulmonary Disease

## 2020-10-12 NOTE — Telephone Encounter (Signed)
I have sent an urgent message to [employer]

## 2020-10-13 NOTE — Telephone Encounter (Signed)
Rec'd confirmation from [place] with [employer] [place], Kristine Garbe; Laneta Simmers, Merleen Nicely,   Patient has ben contacted and schedule for Wed 10-21-20 @ 11am.

## 2020-10-14 ENCOUNTER — Ambulatory Visit: Payer: Medicare Other | Admitting: Pulmonary Disease

## 2020-11-22 ENCOUNTER — Emergency Department: Payer: Medicare Other

## 2020-11-22 ENCOUNTER — Emergency Department
Admission: EM | Admit: 2020-11-22 | Discharge: 2020-11-23 | Disposition: A | Discharge status: Home / Self Care | Payer: Medicare Other | Attending: Emergency Medicine | Admitting: Emergency Medicine

## 2020-11-22 ENCOUNTER — Other Ambulatory Visit: Payer: Self-pay

## 2020-11-22 DIAGNOSIS — R079 Chest pain, unspecified: Secondary | ICD-10-CM

## 2020-11-22 DIAGNOSIS — Z79899 Other long term (current) drug therapy: Secondary | ICD-10-CM | POA: Diagnosis not present

## 2020-11-22 DIAGNOSIS — I10 Essential (primary) hypertension: Secondary | ICD-10-CM | POA: Diagnosis not present

## 2020-11-22 LAB — BASIC METABOLIC PANEL
Anion gap: 9 (ref 5–15)
BUN: 17 mg/dL (ref 6–20)
CO2: 28 mmol/L (ref 22–32)
Calcium: 9.5 mg/dL (ref 8.9–10.3)
Chloride: 98 mmol/L (ref 98–111)
Creatinine, Ser: 1.06 mg/dL (ref 0.61–1.24)
GFR, Estimated: >60 mL/min (ref >60)
Glucose, Bld: 102 mg/dL — ABNORMAL HIGH (ref 70–99)
Potassium: 3.7 mmol/L (ref 3.5–5.1)
Sodium: 135 mmol/L (ref 135–145)

## 2020-11-22 LAB — CBC
HCT: 44.3 % (ref 39.0–52.0)
Hemoglobin: 16 g/dL (ref 13.0–17.0)
MCH: 31.6 pg (ref 26.0–34.0)
MCHC: 36.1 g/dL — ABNORMAL HIGH (ref 30.0–36.0)
MCV: 87.5 fL (ref 80.0–100.0)
Platelets: 293 10*3/uL (ref 150–400)
RBC: 5.06 MIL/uL (ref 4.22–5.81)
RDW: 12.5 % (ref 11.5–15.5)
WBC: 8 10*3/uL (ref 4.0–10.5)
nRBC: 0 % (ref 0.0–0.2)

## 2020-11-22 LAB — TROPONIN I (HIGH SENSITIVITY): Troponin I (High Sensitivity): 3 ng/L (ref <18)

## 2020-11-22 MED ORDER — LIDOCAINE VISCOUS HCL 2 % MT SOLN
15.0000 mL | Freq: Once | OROMUCOSAL | Status: AC
  Administered 2020-11-22: 15 mL via ORAL
  Filled 2020-11-22: qty 15

## 2020-11-22 MED ORDER — ASPIRIN 81 MG PO CHEW
243.0000 mg | CHEWABLE_TABLET | Freq: Once | ORAL | Status: AC
  Administered 2020-11-22: 243 mg via ORAL
  Filled 2020-11-22: qty 3

## 2020-11-22 MED ORDER — ALUM & MAG HYDROXIDE-SIMETH 200-200-20 MG/5ML PO SUSP
30.0000 mL | Freq: Once | ORAL | Status: AC
  Administered 2020-11-22: 30 mL via ORAL
  Filled 2020-11-22: qty 30

## 2020-11-22 MED ORDER — HYDROXYZINE HCL 25 MG PO TABS
25.0000 mg | ORAL_TABLET | Freq: Once | ORAL | Status: AC
  Administered 2020-11-23: 25 mg via ORAL
  Filled 2020-11-22: qty 1

## 2020-11-22 NOTE — ED Triage Notes (Signed)
Pt presents ambulatory to triage with c/o chest pain x 1.5 mo. Pt has been seen by PCP with EKG where he was told that he has possible R sided heart failure.   Pt has cardiology appt tomorrow but began having increased substernal chest pressure tonight. Endorses radiation between shoulder blades.    Took 81mg  ASA PTA.   Denies, nausea, diaphoresis.

## 2020-11-22 NOTE — ED Provider Notes (Signed)
[facility] Emergency Department Provider Note   ____________________________________________   I have reviewed the triage vital signs and the nursing notes.   HISTORY  Chief Complaint Chest Pain   History limited by: Not Limited   HPI John [name] is a 50 y.o. male who presents to the emergency department today because of concerns for chest pain.  He states he has been having the pain on and off for the past roughly month and a half.  States it is located in various areas around his left chest.  Typically does not last very long.  He has arranged to be seen by cardiologist tomorrow for this.  He comes in today however because pain is been more persistent.  He denies any radiation to his neck or arm.  Denies any shortness of breath nausea or vomiting.   Records reviewed. Per medical record review patient has a history of barrett esophagus.   Past Medical History:  Diagnosis Date   Barrett esophagus    BPH (benign prostatic hypertrophy)    Depression    GERD (gastroesophageal reflux disease)    H/O hiatal hernia    History of cardiac murmur as a child    History of cardiac murmur as a child    History of kidney stones    Hx of chronic cholecystitis    w/ stone   Hypertension    Migraine    OSA on CPAP     Patient Active Problem List   Diagnosis Date Noted   S/P laparoscopic procedure    Gastric polyp    Gastric erythema    Stomach pain    Esophageal dysphagia    Colon cancer screening    Microscopic colitis    Polyp of colon    Symptomatic cholelithiasis 05/02/2016   Acute cholecystitis 05/02/2016   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/06/2013   Lap Nissen over 22 Fr with single suture closure of diaphragm 04/05/2013   Right inguinal hernia 04/05/2013   GERD (gastroesophageal reflux disease) 01/25/2013   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   Barrett esophagus 08/30/2012   OSA (obstructive sleep apnea) 08/30/2012    Past Surgical  History:  Procedure Laterality Date   APPENDECTOMY  1999   CHOLECYSTECTOMY  05/02/2016   LAPROSCOPIC   CHOLECYSTECTOMY N/A 05/02/2016   Procedure: LAPAROSCOPIC CHOLECYSTECTOMY WITH INTRAOPERATIVE CHOLANGIOGRAM;  Surgeon: Mickeal Skinner, MD;  Location: [facility];  Service: General;  Laterality: N/A;   COLONOSCOPY WITH PROPOFOL N/A 05/15/2020   Procedure: COLONOSCOPY WITH PROPOFOL;  Surgeon: Virgel Manifold, MD;  Location: [facility];  Service: Endoscopy;  Laterality: N/A;   ESOPHAGEAL MANOMETRY N/A 11/12/2012   Procedure: ESOPHAGEAL MANOMETRY (EM);  Surgeon: Beryle Beams, MD;  Location: [facility];  Service: Endoscopy;  Laterality: N/A;   ESOPHAGOGASTRODUODENOSCOPY (EGD) WITH PROPOFOL N/A 05/15/2020   Procedure: ESOPHAGOGASTRODUODENOSCOPY (EGD) WITH PROPOFOL;  Surgeon: Virgel Manifold, MD;  Location: [facility];  Service: Endoscopy;  Laterality: N/A;   KNEE ARTHROSCOPY Right 04/18/2014   Procedure: RIGHT ARTHROSCOPY KNEE / PLICA EXCISION;  Surgeon: Johnn Hai, MD;  Location: [facility];  Service: Orthopedics;  Laterality: Right;   LAPAROSCOPIC NISSEN FUNDOPLICATION N/A 6/73/4193   Procedure: LAPAROSCOPIC NISSEN FUNDOPLICATION;  Surgeon: Pedro Earls, MD;  Location: [facility];  Service: General;  Laterality: N/A;   NASAL SEPTUM SURGERY  2002   UPPER GI ENDOSCOPY  2013    Prior to Admission medications   Medication Sig Start Date End Date Taking? Authorizing  Provider  amLODipine (NORVASC) 5 MG tablet Take 5 mg by mouth daily. 03/10/20   [provider]  baclofen (LIORESAL) 10 MG tablet Take 10-20 mg by mouth 2 (two) times daily as needed (for headaches).     [provider]  cetirizine (ZYRTEC) 10 MG tablet Take by mouth. 02/07/20   [provider]  Diclofenac Potassium,Migraine, 50 MG PACK Take 50 mg by mouth 2 (two) times daily as needed. Migraines    [provider]  esomeprazole (NEXIUM) 40 MG capsule Take 40 mg by  mouth daily before breakfast.    [provider]  fluticasone (FLOVENT HFA) 110 MCG/ACT inhaler 2 puffs 2 (two) times daily. 02/05/20   [provider]  lisinopril (ZESTRIL) 2.5 MG tablet Take by mouth. 06/29/20 06/29/21  [provider]  nortriptyline (PAMELOR) 10 MG capsule 20 mg. 06/29/20   [provider]  ondansetron (ZOFRAN ODT) 4 MG disintegrating tablet Take 1 tablet (4 mg total) by mouth every 8 (eight) hours as needed for nausea or vomiting. 02/09/20   Carrie Mew, MD  PROVENTIL HFA 108 [phone] Base) MCG/ACT inhaler Inhale 2 puff using inhaler every six hours as needed for breathing 12/06/19   [provider]    Allergies Doxycycline and Nitroglycerin  Family History  Problem Relation Age of Onset   Cancer Father        skin   Diabetes Father    Cancer Mother        pancreatic 2010   Hypertension Mother    Cancer Paternal Aunt        breast    Social History Social History   Tobacco Use   Smoking status: Never   Smokeless tobacco: Never  Substance Use Topics   Alcohol use: No   Drug use: No    Review of Systems Constitutional: No fever/chills Eyes: No visual changes. ENT: No sore throat. Cardiovascular: Positive for chest pain. Respiratory: Denies shortness of breath. Gastrointestinal: No abdominal pain.  No nausea, no vomiting.  No diarrhea.   Genitourinary: Negative for dysuria. Musculoskeletal: Negative for back pain. Skin: Negative for rash. Neurological: Negative for headaches, focal weakness or numbness.  ____________________________________________   PHYSICAL EXAM:  VITAL SIGNS: ED Triage Vitals  Enc Vitals Group     BP 11/22/20 2146 (!) 140/96     Pulse Rate 11/22/20 2146 88     Resp 11/22/20 2146 16     Temp 11/22/20 2146 98.9 F (37.2 C)     Temp Source 11/22/20 2146 Oral     SpO2 11/22/20 2146 99 %     Weight 11/22/20 2148 197 lb (89.4 kg)     Height 11/22/20 2148 5\' 9"  (1.753 m)    Constitutional: Alert and oriented.  Eyes: Conjunctivae are normal.  ENT      Head: Normocephalic and atraumatic.      Nose: No congestion/rhinnorhea.      Mouth/Throat: Mucous membranes are moist.      Neck: No stridor. Hematological/Lymphatic/Immunilogical: No cervical lymphadenopathy. Cardiovascular: Normal rate, regular rhythm.  No murmurs, rubs, or gallops.  Respiratory: Normal respiratory effort without tachypnea nor retractions. Breath sounds are clear and equal bilaterally. No wheezes/rales/rhonchi. Gastrointestinal: Soft and non tender. No rebound. No guarding.  Genitourinary: Deferred Musculoskeletal: Normal range of motion in all extremities. No lower extremity edema. Neurologic:  Normal speech and language. No gross focal neurologic deficits are appreciated.  Skin:  Skin is warm, dry and intact. No rash noted. Psychiatric: Mood and affect  are normal. Speech and behavior are normal. Patient exhibits appropriate insight and judgment.  ____________________________________________    LABS (pertinent positives/negatives)  Trop hs 3 CBC wbc 8.0, hgb 16.0, plt 293 BMP wnl except glu 102 ____________________________________________   EKG  I, Nance Pear, attending physician, personally viewed and interpreted this EKG  EKG Time: 2140 Rate: 84 Rhythm: normal sinus rhythm Axis: left axis deviation Intervals: qtc 449 QRS: Incomplete RBBB, LVH ST changes: no st elevation Impression: abnormal ekg  ____________________________________________    RADIOLOGY  CXR No active cardiopulmonary disease   ____________________________________________   PROCEDURES  Procedures  ____________________________________________   INITIAL IMPRESSION / ASSESSMENT AND PLAN / ED COURSE  Pertinent labs & imaging results that were available during my care of the patient were reviewed by me and considered in my medical decision making (see chart for details).   Patient  presents to the emergency department today because of concerns for chest pain.  Patient did take 1 baby aspirin at home so will give 3 more here.  EKG without any concerning ST elevation or arrhythmia.  Initial troponin was negative.  Chest x-ray without any pneumonia or pneumothorax.  Doubt PE or dissection given intermittent nature of pain over the past month and a half.  Will plan on repeating troponin.  If negative I do think would be reasonable for patient to be discharged.  He does have cardiology appointment already scheduled for tomorrow.  ____________________________________________   FINAL CLINICAL IMPRESSION(S) / ED DIAGNOSES  Final diagnoses:  Nonspecific chest pain     Note: This dictation was prepared with Dragon dictation. Any transcriptional errors that result from this process are unintentional     Nance Pear, MD 11/22/20 2336

## 2020-11-22 NOTE — ED Provider Notes (Signed)
-----------------------------------------   11:58 PM on 11/22/2020 -----------------------------------------  Assuming care from Dr. Archie Balboa.  In short, John [name] is a 50 y.o. male with a chief complaint of chest pain, probable anxiety.  Refer to the original H&P for additional details.  The current plan of care is to follow-up second troponin.  Anticipate discharge.  Patient has cardiology follow-up scheduled for tomorrow.    (Note that documentation was delayed due to multiple ED patients requiring immediate care.)  Repeat troponin was within normal limits.  I updated the patient and he understands and is comfortable with the plan for discharge and follow-up with cardiology later today.  I gave my usual and customary verbal return precautions.   Hinda Kehr, MD 11/23/20 [phone]

## 2020-11-23 LAB — TROPONIN I (HIGH SENSITIVITY): Troponin I (High Sensitivity): 3 ng/L (ref <18)

## 2020-11-23 NOTE — ED Notes (Signed)
Denies improvement of epigastric "pushing" pain after GI cocktail and reports pain is now upper left sternal.

## 2020-11-23 NOTE — Discharge Instructions (Signed)

## 2021-02-20 DIAGNOSIS — G4733 Obstructive sleep apnea (adult) (pediatric): Secondary | ICD-10-CM | POA: Diagnosis not present

## 2021-02-20 DIAGNOSIS — I10 Essential (primary) hypertension: Secondary | ICD-10-CM | POA: Diagnosis not present

## 2021-02-20 DIAGNOSIS — R0681 Apnea, not elsewhere classified: Secondary | ICD-10-CM | POA: Diagnosis not present

## 2021-03-23 DIAGNOSIS — R0681 Apnea, not elsewhere classified: Secondary | ICD-10-CM | POA: Diagnosis not present

## 2021-03-23 DIAGNOSIS — G4733 Obstructive sleep apnea (adult) (pediatric): Secondary | ICD-10-CM | POA: Diagnosis not present

## 2021-03-23 DIAGNOSIS — I10 Essential (primary) hypertension: Secondary | ICD-10-CM | POA: Diagnosis not present

## 2021-03-29 DIAGNOSIS — K760 Fatty (change of) liver, not elsewhere classified: Secondary | ICD-10-CM | POA: Diagnosis not present

## 2021-03-29 DIAGNOSIS — M436 Torticollis: Secondary | ICD-10-CM | POA: Diagnosis not present

## 2021-03-29 DIAGNOSIS — K219 Gastro-esophageal reflux disease without esophagitis: Secondary | ICD-10-CM | POA: Diagnosis not present

## 2021-03-29 DIAGNOSIS — I10 Essential (primary) hypertension: Secondary | ICD-10-CM | POA: Diagnosis not present

## 2021-03-29 DIAGNOSIS — G8929 Other chronic pain: Secondary | ICD-10-CM | POA: Diagnosis not present

## 2021-03-29 DIAGNOSIS — G4733 Obstructive sleep apnea (adult) (pediatric): Secondary | ICD-10-CM | POA: Diagnosis not present

## 2021-03-29 DIAGNOSIS — Z79899 Other long term (current) drug therapy: Secondary | ICD-10-CM | POA: Diagnosis not present

## 2021-03-29 DIAGNOSIS — E785 Hyperlipidemia, unspecified: Secondary | ICD-10-CM | POA: Diagnosis not present

## 2021-03-29 DIAGNOSIS — Z881 Allergy status to other antibiotic agents status: Secondary | ICD-10-CM | POA: Diagnosis not present

## 2021-03-29 DIAGNOSIS — M79641 Pain in right hand: Secondary | ICD-10-CM | POA: Diagnosis not present

## 2021-03-29 DIAGNOSIS — M79642 Pain in left hand: Secondary | ICD-10-CM | POA: Diagnosis not present

## 2021-03-29 DIAGNOSIS — M5082 Other cervical disc disorders, mid-cervical region, unspecified level: Secondary | ICD-10-CM | POA: Diagnosis not present

## 2021-03-29 DIAGNOSIS — M545 Low back pain, unspecified: Secondary | ICD-10-CM | POA: Diagnosis not present

## 2021-03-29 DIAGNOSIS — M5032 Other cervical disc degeneration, mid-cervical region, unspecified level: Secondary | ICD-10-CM | POA: Diagnosis not present

## 2021-03-29 DIAGNOSIS — M542 Cervicalgia: Secondary | ICD-10-CM | POA: Diagnosis not present

## 2021-03-29 DIAGNOSIS — M459 Ankylosing spondylitis of unspecified sites in spine: Secondary | ICD-10-CM | POA: Diagnosis not present

## 2021-03-29 DIAGNOSIS — R519 Headache, unspecified: Secondary | ICD-10-CM | POA: Diagnosis not present

## 2021-03-29 DIAGNOSIS — M549 Dorsalgia, unspecified: Secondary | ICD-10-CM | POA: Diagnosis not present

## 2021-04-05 DIAGNOSIS — R519 Headache, unspecified: Secondary | ICD-10-CM | POA: Diagnosis not present

## 2021-04-05 DIAGNOSIS — G4733 Obstructive sleep apnea (adult) (pediatric): Secondary | ICD-10-CM | POA: Diagnosis not present

## 2021-04-19 DIAGNOSIS — R002 Palpitations: Secondary | ICD-10-CM | POA: Diagnosis not present

## 2021-04-19 DIAGNOSIS — R5383 Other fatigue: Secondary | ICD-10-CM | POA: Diagnosis not present

## 2021-04-19 DIAGNOSIS — Z0000 Encounter for general adult medical examination without abnormal findings: Secondary | ICD-10-CM | POA: Diagnosis not present

## 2021-04-19 DIAGNOSIS — Z1389 Encounter for screening for other disorder: Secondary | ICD-10-CM | POA: Diagnosis not present

## 2021-04-19 DIAGNOSIS — F339 Major depressive disorder, recurrent, unspecified: Secondary | ICD-10-CM | POA: Diagnosis not present

## 2021-04-19 DIAGNOSIS — Z0130 Encounter for examination of blood pressure without abnormal findings: Secondary | ICD-10-CM | POA: Diagnosis not present

## 2021-04-19 DIAGNOSIS — G4733 Obstructive sleep apnea (adult) (pediatric): Secondary | ICD-10-CM | POA: Diagnosis not present

## 2021-04-19 DIAGNOSIS — M791 Myalgia, unspecified site: Secondary | ICD-10-CM | POA: Diagnosis not present

## 2021-04-20 DIAGNOSIS — R0681 Apnea, not elsewhere classified: Secondary | ICD-10-CM | POA: Diagnosis not present

## 2021-04-20 DIAGNOSIS — I10 Essential (primary) hypertension: Secondary | ICD-10-CM | POA: Diagnosis not present

## 2021-04-20 DIAGNOSIS — G4733 Obstructive sleep apnea (adult) (pediatric): Secondary | ICD-10-CM | POA: Diagnosis not present

## 2021-04-21 DIAGNOSIS — I10 Essential (primary) hypertension: Secondary | ICD-10-CM | POA: Diagnosis not present

## 2021-04-21 DIAGNOSIS — G4733 Obstructive sleep apnea (adult) (pediatric): Secondary | ICD-10-CM | POA: Diagnosis not present

## 2021-04-21 DIAGNOSIS — R0681 Apnea, not elsewhere classified: Secondary | ICD-10-CM | POA: Diagnosis not present

## 2021-04-29 DIAGNOSIS — F419 Anxiety disorder, unspecified: Secondary | ICD-10-CM | POA: Diagnosis not present

## 2021-04-29 DIAGNOSIS — Z7951 Long term (current) use of inhaled steroids: Secondary | ICD-10-CM | POA: Diagnosis not present

## 2021-04-29 DIAGNOSIS — G4733 Obstructive sleep apnea (adult) (pediatric): Secondary | ICD-10-CM | POA: Diagnosis not present

## 2021-04-29 DIAGNOSIS — Z881 Allergy status to other antibiotic agents status: Secondary | ICD-10-CM | POA: Diagnosis not present

## 2021-04-29 DIAGNOSIS — R002 Palpitations: Secondary | ICD-10-CM | POA: Diagnosis not present

## 2021-04-29 DIAGNOSIS — I2510 Atherosclerotic heart disease of native coronary artery without angina pectoris: Secondary | ICD-10-CM | POA: Diagnosis not present

## 2021-04-29 DIAGNOSIS — I4510 Unspecified right bundle-branch block: Secondary | ICD-10-CM | POA: Diagnosis not present

## 2021-04-29 DIAGNOSIS — I10 Essential (primary) hypertension: Secondary | ICD-10-CM | POA: Diagnosis not present

## 2021-05-03 DIAGNOSIS — J029 Acute pharyngitis, unspecified: Secondary | ICD-10-CM | POA: Diagnosis not present

## 2021-05-05 DIAGNOSIS — R002 Palpitations: Secondary | ICD-10-CM | POA: Diagnosis not present

## 2021-05-11 DIAGNOSIS — R7303 Prediabetes: Secondary | ICD-10-CM | POA: Diagnosis not present

## 2021-05-21 DIAGNOSIS — G4733 Obstructive sleep apnea (adult) (pediatric): Secondary | ICD-10-CM | POA: Diagnosis not present

## 2021-05-21 DIAGNOSIS — I10 Essential (primary) hypertension: Secondary | ICD-10-CM | POA: Diagnosis not present

## 2021-05-21 DIAGNOSIS — R0681 Apnea, not elsewhere classified: Secondary | ICD-10-CM | POA: Diagnosis not present

## 2021-05-21 DIAGNOSIS — R002 Palpitations: Secondary | ICD-10-CM | POA: Diagnosis not present

## 2021-06-08 DIAGNOSIS — R002 Palpitations: Secondary | ICD-10-CM | POA: Diagnosis not present

## 2021-06-09 DIAGNOSIS — K22719 Barrett's esophagus with dysplasia, unspecified: Secondary | ICD-10-CM | POA: Diagnosis not present

## 2021-06-10 DIAGNOSIS — Z0100 Encounter for examination of eyes and vision without abnormal findings: Secondary | ICD-10-CM | POA: Diagnosis not present

## 2021-06-10 DIAGNOSIS — H5213 Myopia, bilateral: Secondary | ICD-10-CM | POA: Diagnosis not present

## 2021-06-15 DIAGNOSIS — R5383 Other fatigue: Secondary | ICD-10-CM | POA: Diagnosis not present

## 2021-06-15 DIAGNOSIS — H9313 Tinnitus, bilateral: Secondary | ICD-10-CM | POA: Diagnosis not present

## 2021-06-15 DIAGNOSIS — Z0000 Encounter for general adult medical examination without abnormal findings: Secondary | ICD-10-CM | POA: Diagnosis not present

## 2021-06-15 DIAGNOSIS — M791 Myalgia, unspecified site: Secondary | ICD-10-CM | POA: Diagnosis not present

## 2021-06-15 DIAGNOSIS — R251 Tremor, unspecified: Secondary | ICD-10-CM | POA: Diagnosis not present

## 2021-06-15 DIAGNOSIS — K2270 Barrett's esophagus without dysplasia: Secondary | ICD-10-CM | POA: Diagnosis not present

## 2021-06-15 DIAGNOSIS — Z0130 Encounter for examination of blood pressure without abnormal findings: Secondary | ICD-10-CM | POA: Diagnosis not present

## 2021-06-20 DIAGNOSIS — I10 Essential (primary) hypertension: Secondary | ICD-10-CM | POA: Diagnosis not present

## 2021-06-20 DIAGNOSIS — G4733 Obstructive sleep apnea (adult) (pediatric): Secondary | ICD-10-CM | POA: Diagnosis not present

## 2021-06-20 DIAGNOSIS — R0681 Apnea, not elsewhere classified: Secondary | ICD-10-CM | POA: Diagnosis not present

## 2021-07-01 DIAGNOSIS — Z791 Long term (current) use of non-steroidal anti-inflammatories (NSAID): Secondary | ICD-10-CM | POA: Diagnosis not present

## 2021-07-01 DIAGNOSIS — F419 Anxiety disorder, unspecified: Secondary | ICD-10-CM | POA: Diagnosis not present

## 2021-07-01 DIAGNOSIS — Z888 Allergy status to other drugs, medicaments and biological substances status: Secondary | ICD-10-CM | POA: Diagnosis not present

## 2021-07-01 DIAGNOSIS — G4733 Obstructive sleep apnea (adult) (pediatric): Secondary | ICD-10-CM | POA: Diagnosis not present

## 2021-07-01 DIAGNOSIS — R0602 Shortness of breath: Secondary | ICD-10-CM | POA: Diagnosis not present

## 2021-07-01 DIAGNOSIS — Z79899 Other long term (current) drug therapy: Secondary | ICD-10-CM | POA: Diagnosis not present

## 2021-07-01 DIAGNOSIS — R519 Headache, unspecified: Secondary | ICD-10-CM | POA: Diagnosis not present

## 2021-07-01 DIAGNOSIS — I10 Essential (primary) hypertension: Secondary | ICD-10-CM | POA: Diagnosis not present

## 2021-07-01 DIAGNOSIS — R002 Palpitations: Secondary | ICD-10-CM | POA: Diagnosis not present

## 2021-07-01 DIAGNOSIS — Z881 Allergy status to other antibiotic agents status: Secondary | ICD-10-CM | POA: Diagnosis not present

## 2021-07-01 DIAGNOSIS — Z8616 Personal history of COVID-19: Secondary | ICD-10-CM | POA: Diagnosis not present

## 2021-07-01 DIAGNOSIS — F329 Major depressive disorder, single episode, unspecified: Secondary | ICD-10-CM | POA: Diagnosis not present

## 2021-07-01 DIAGNOSIS — G8929 Other chronic pain: Secondary | ICD-10-CM | POA: Diagnosis not present

## 2021-07-02 DIAGNOSIS — G44021 Chronic cluster headache, intractable: Secondary | ICD-10-CM | POA: Diagnosis not present

## 2021-07-02 DIAGNOSIS — R251 Tremor, unspecified: Secondary | ICD-10-CM | POA: Diagnosis not present

## 2021-07-13 DIAGNOSIS — F339 Major depressive disorder, recurrent, unspecified: Secondary | ICD-10-CM | POA: Diagnosis not present

## 2021-07-13 DIAGNOSIS — Z0130 Encounter for examination of blood pressure without abnormal findings: Secondary | ICD-10-CM | POA: Diagnosis not present

## 2021-07-13 DIAGNOSIS — G4700 Insomnia, unspecified: Secondary | ICD-10-CM | POA: Diagnosis not present

## 2021-07-13 DIAGNOSIS — Z1389 Encounter for screening for other disorder: Secondary | ICD-10-CM | POA: Diagnosis not present

## 2021-07-13 DIAGNOSIS — Z0131 Encounter for examination of blood pressure with abnormal findings: Secondary | ICD-10-CM | POA: Diagnosis not present

## 2021-07-13 DIAGNOSIS — R251 Tremor, unspecified: Secondary | ICD-10-CM | POA: Diagnosis not present

## 2021-07-13 DIAGNOSIS — B360 Pityriasis versicolor: Secondary | ICD-10-CM | POA: Diagnosis not present

## 2021-07-21 DIAGNOSIS — R0681 Apnea, not elsewhere classified: Secondary | ICD-10-CM | POA: Diagnosis not present

## 2021-07-21 DIAGNOSIS — I10 Essential (primary) hypertension: Secondary | ICD-10-CM | POA: Diagnosis not present

## 2021-07-21 DIAGNOSIS — G4733 Obstructive sleep apnea (adult) (pediatric): Secondary | ICD-10-CM | POA: Diagnosis not present

## 2021-08-02 ENCOUNTER — Other Ambulatory Visit: Payer: Self-pay

## 2021-08-02 ENCOUNTER — Encounter: Payer: Self-pay | Admitting: Gastroenterology

## 2021-08-02 ENCOUNTER — Ambulatory Visit (INDEPENDENT_AMBULATORY_CARE_PROVIDER_SITE_OTHER): Payer: Medicare HMO | Admitting: Gastroenterology

## 2021-08-02 VITALS — BP 156/92 | HR 80 | Temp 98.8°F | Ht 69.0 in | Wt 210.5 lb

## 2021-08-02 DIAGNOSIS — R748 Abnormal levels of other serum enzymes: Secondary | ICD-10-CM

## 2021-08-02 DIAGNOSIS — K2270 Barrett's esophagus without dysplasia: Secondary | ICD-10-CM | POA: Diagnosis not present

## 2021-08-02 DIAGNOSIS — Z86010 Personal history of colonic polyps: Secondary | ICD-10-CM

## 2021-08-02 DIAGNOSIS — R079 Chest pain, unspecified: Secondary | ICD-10-CM | POA: Insufficient documentation

## 2021-08-02 DIAGNOSIS — F411 Generalized anxiety disorder: Secondary | ICD-10-CM | POA: Insufficient documentation

## 2021-08-02 DIAGNOSIS — R7989 Other specified abnormal findings of blood chemistry: Secondary | ICD-10-CM | POA: Insufficient documentation

## 2021-08-02 MED ORDER — CLENPIQ 10-3.5-12 MG-GM -GM/160ML PO SOLN: 350.0000 mL | Freq: Once | ORAL | 0 refills | Status: AC

## 2021-08-02 NOTE — Progress Notes (Signed)
John Darby, MD [address]  [place]  [place], [place] [address]  Main: [phone]  Fax: [phone]    Gastroenterology Consultation  Referring Provider:     No ref. provider found Primary Care Physician:  System, Provider Not In Primary Gastroenterologist:  Dr. Cephas [name] Reason for Consultation: Barrett's esophagus, history of colon polyps, elevated LFTs  HPI:   John [name] is a 51 y.o. male referred by Dr. 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  Patient is here to discuss about repeat colonoscopy because his previous colonoscopy was with inadequate prep and he had tubular adenomas of the colon.  Patient had mildly elevated LFTs, underwent secondary liver disease work-up which was unremarkable.  Right upper quadrant ultrasound was unremarkable.  Patient has chronic headaches, closely followed by neurology.  No evidence of iron deficiency anemia.  He takes meloxicam daily at bedtime.  He takes Nexium 40 mg once a day before breakfast, long-term  NSAIDs: Meloxicam  Antiplts/Anticoagulants/Anti thrombotics: None  GI Procedures:  EGD and colonoscopy 05/15/2020 - Salmon-colored mucosa suspicious for short-segment Barrett's esophagus. Biopsied. - Erythematous mucosa in the antrum. - A few gastric polyps. Biopsied. - A fundoplication was found. The wrap appears intact. - Mucosal changes in the duodenum. Biopsied. - Nodular mucosa in the duodenal bulb. Biopsied. - Biopsies were obtained in the gastric body, at the incisura and in the gastric antrum.  - Preparation of the colon was fair. - Two 5 mm polyps in the sigmoid colon and in the ascending colon, removed with a cold snare. Resected and retrieved. - The examination was otherwise normal. - The rectum, sigmoid colon, descending colon, transverse colon, ascending colon and cecum are normal. Biopsied. - The distal rectum  and anal verge are normal on retroflexion view.  DIAGNOSIS:  A. DUODENUM; COLD BIOPSY:  - ENTERIC MUCOSA WITH PRESERVED VILLOUS ARCHITECTURE AND NO SIGNIFICANT  HISTOPATHOLOGIC CHANGE.  - NEGATIVE FOR FEATURES OF CELIAC, DYSPLASIA, AND MALIGNANCY.   B. DUODENAL BULB; COLD BIOPSY:  - GASTRIC HETEROTOPIA.  - ADJACENT ENTERIC MUCOSA WITH NORMAL VILLOUS ARCHITECTURE AND NO  SIGNIFICANT HISTOPATHOLOGIC CHANGE.  - NEGATIVE FOR FEATURES OF CELIAC, DYSPLASIA, AND MALIGNANCY.   C. 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  - NEGATIVE FOR H. PYLORI, DYSPLASIA, AND MALIGNANCY.   D. GASTRIC POLYP; COLD BIOPSY:  - FUNDIC GLAND POLYP.  - NEGATIVE FOR H. PYLORI, DYSPLASIA, AND MALIGNANCY.   E. ESOPHAGUS; COLD BIOPSY:  - SQUAMOCOLUMNAR MUCOSA WITH INTESTINAL METAPLASIA AND FEATURES OF  REFLUX GASTROESOPHAGITIS.  - NEGATIVE FOR DYSPLASIA AND MALIGNANCY.   F. ESOPHAGUS; COLD BIOPSY:  - SQUAMOUS MUCOSA WITH ACANTHOSIS AND SPONGIOSIS, MOST SUGGESTIVE OF  REFLUX ESOPHAGITIS.  - NO INCREASE IN INTRAEPITHELIAL EOSINOPHILS (LESS THAN 2 PER HPF).  - NEGATIVE FOR DYSPLASIA AND MALIGNANCY.   G. COLON, RANDOM; COLD BIOPSY:  - BENIGN COLONIC MUCOSA WITH NO SIGNIFICANT HISTOPATHOLOGIC CHANGE.  - NEGATIVE FOR FEATURES OF MICROSCOPIC COLITIS.  - NEGATIVE FOR DYSPLASIA AND MALIGNANCY.   H. COLON POLYPS X2, ASCENDING AND SIGMOID; COLD SNARE:  - SINGLE FRAGMENT OF TUBULAR ADENOMA.  - SINGLE FRAGMENT OF BENIGN INFLAMMATORY TYPE POLYP.  - NEGATIVE FOR HIGH-GRADE DYSPLASIA AND MALIGNANCY.   Past Medical History:  Diagnosis Date   Barrett esophagus    BPH (benign prostatic hypertrophy)    Depression    GERD (  gastroesophageal reflux disease)    H/O hiatal hernia    History of cardiac murmur as a child    History of cardiac murmur as a child    History of kidney stones    Hx of chronic cholecystitis    w/ stone    Hypertension    Migraine    OSA on CPAP     Past Surgical History:  Procedure Laterality Date   APPENDECTOMY  1999   CHOLECYSTECTOMY  05/02/2016   LAPROSCOPIC   CHOLECYSTECTOMY N/A 05/02/2016   Procedure: LAPAROSCOPIC CHOLECYSTECTOMY WITH INTRAOPERATIVE CHOLANGIOGRAM;  Surgeon: Mickeal Skinner, MD;  Location: [facility];  Service: General;  Laterality: N/A;   COLONOSCOPY WITH PROPOFOL N/A 05/15/2020   Procedure: COLONOSCOPY WITH PROPOFOL;  Surgeon: Virgel Manifold, MD;  Location: [facility];  Service: Endoscopy;  Laterality: N/A;   ESOPHAGEAL MANOMETRY N/A 11/12/2012   Procedure: ESOPHAGEAL MANOMETRY (EM);  Surgeon: Beryle Beams, MD;  Location: [facility];  Service: Endoscopy;  Laterality: N/A;   ESOPHAGOGASTRODUODENOSCOPY (EGD) WITH PROPOFOL N/A 05/15/2020   Procedure: ESOPHAGOGASTRODUODENOSCOPY (EGD) WITH PROPOFOL;  Surgeon: Virgel Manifold, MD;  Location: [facility];  Service: Endoscopy;  Laterality: N/A;   KNEE ARTHROSCOPY Right 04/18/2014   Procedure: RIGHT ARTHROSCOPY KNEE / PLICA EXCISION;  Surgeon: Johnn Hai, MD;  Location: [facility];  Service: Orthopedics;  Laterality: Right;   LAPAROSCOPIC NISSEN FUNDOPLICATION N/A 10/30/9448   Procedure: LAPAROSCOPIC NISSEN FUNDOPLICATION;  Surgeon: Pedro Earls, MD;  Location: [facility];  Service: General;  Laterality: N/A;   NASAL SEPTUM SURGERY  2002   UPPER GI ENDOSCOPY  2013     Current Outpatient Medications:    amLODipine (NORVASC) 5 MG tablet, Take 5 mg by mouth daily., Disp: , Rfl:    baclofen (LIORESAL) 10 MG tablet, Take by mouth., Disp: , Rfl:    buPROPion (WELLBUTRIN XL) 300 MG 24 hr tablet, Take 300 mg by mouth daily., Disp: , Rfl:    cetirizine (ZYRTEC) 10 MG tablet, Take 1 tablet by mouth daily., Disp: , Rfl:    Diclofenac Potassium,Migraine, 50 MG PACK, Take 50 mg by mouth 2 (two) times daily as needed. Migraines, Disp: , Rfl:    esomeprazole (NEXIUM) 40 MG capsule, Take 40 mg by mouth  daily before breakfast., Disp: , Rfl:    fluticasone (FLOVENT HFA) 110 MCG/ACT inhaler, 2 puffs 2 (two) times daily., Disp: , Rfl:    hydrOXYzine (ATARAX) 10 MG tablet, Take 10 mg by mouth at bedtime as needed., Disp: , Rfl:    ketoconazole (NIZORAL) 2 % shampoo, Apply topically daily., Disp: , Rfl:    lisinopril (ZESTRIL) 5 MG tablet, Take 1 tablet by mouth daily., Disp: , Rfl:    magnesium oxide (MAG-OX) 400 MG tablet, Take 2 tablets by mouth daily., Disp: , Rfl:    meloxicam (MOBIC) 15 MG tablet, Take 15 mg by mouth daily., Disp: , Rfl:    nortriptyline (PAMELOR) 10 MG capsule, 20 mg., Disp: , Rfl:    ondansetron (ZOFRAN ODT) 4 MG disintegrating tablet, Take 1 tablet (4 mg total) by mouth every 8 (eight) hours as needed for nausea or vomiting., Disp: 20 tablet, Rfl: 0   PROVENTIL HFA 108 (90 Base) MCG/ACT inhaler, Inhale 2 puff using inhaler every six hours as needed for breathing, Disp: , Rfl:    Sod Picosulfate-Mag Ox-Cit Acd (CLENPIQ) 10-3.5-12 MG-GM -GM/160ML SOLN, Take 350 mLs by mouth once for 1 dose. 2 day prep, Disp: 700 mL, Rfl: 0  sucralfate (CARAFATE) 1 GM/10ML suspension, Take 1 g by mouth 4 (four) times daily., Disp: , Rfl:    tiZANidine (ZANAFLEX) 4 MG tablet, Take 4 mg by mouth 3 (three) times daily., Disp: , Rfl:    venlafaxine XR (EFFEXOR-XR) 150 MG 24 hr capsule, Take 150 mg by mouth daily., Disp: , Rfl:    Family History  Problem Relation Age of Onset   Cancer Father        skin   Diabetes Father    Cancer Mother        pancreatic 2010   Hypertension Mother    Cancer Paternal Aunt        breast     Social History   Tobacco Use   Smoking status: Never   Smokeless tobacco: Never  Substance Use Topics   Alcohol use: No   Drug use: No    Allergies as of 08/02/2021 - Review Complete 08/02/2021  Allergen Reaction Noted   Doxycycline Other (See Comments) 08/30/2012   Nitroglycerin Other (See Comments) 08/30/2012    Review of Systems:    All systems  reviewed and negative except where noted in HPI.   Physical Exam:  BP (!) 156/92 (BP Location: Left Arm, Patient Position: Sitting, Cuff Size: Normal)   Pulse 80   Temp 98.8 F (37.1 C) (Oral)   Ht '5\' 9"'$  (1.753 m)   Wt 210 lb 8 oz (95.5 kg)   BMI 31.09 kg/m  No LMP for male patient.  General:   Alert,  Well-developed, well-nourished, pleasant and cooperative in NAD Head:  Normocephalic and atraumatic. Eyes:  Sclera clear, no icterus.   Conjunctiva pink. Ears:  Normal auditory acuity. Nose:  No deformity, discharge, or lesions. Mouth:  No deformity or lesions,oropharynx pink & moist. Neck:  Supple; no masses or thyromegaly. Lungs:  Respirations even and unlabored.  Clear throughout to auscultation.   No wheezes, crackles, or rhonchi. No acute distress. Heart:  Regular rate and rhythm; no murmurs, clicks, rubs, or gallops. Abdomen:  Normal bowel sounds. Soft, non-tender and non-distended without masses, hepatosplenomegaly or hernias noted.  No guarding or rebound tenderness.   Rectal: Not performed Msk:  Symmetrical without gross deformities. Good, equal movement & strength bilaterally. Pulses:  Normal pulses noted. Extremities:  No clubbing or edema.  No cyanosis. Neurologic:  Alert and oriented x3;  grossly normal neurologically. Skin:  Intact without significant lesions or rashes. No jaundice. Psych:  Alert and cooperative. Normal mood and affect.  Imaging Studies: Reviewed  Assessment and Plan:   John [name] is a 51 y.o. male with history of chronic headaches, s/p cholecystectomy, hypertension, sleep apnea is seen in for follow-up of short segment Barrett's esophagus without dysplasia, tubular adenoma of the colon  Short segment Barrett's esophagus Recommend surveillance EGD in 2027 Continue Nexium 40 mg daily  Tubular adenoma of the colon Recommend surveillance colonoscopy with 2-day prep  Mild elevated LFTs Secondary liver disease work-up was  negative Ultrasound did not reveal fatty liver Recheck LFTs today   Follow up as needed   John Darby, MD

## 2021-08-03 ENCOUNTER — Telehealth: Payer: Self-pay

## 2021-08-03 ENCOUNTER — Encounter: Payer: Self-pay | Admitting: Gastroenterology

## 2021-08-03 DIAGNOSIS — R748 Abnormal levels of other serum enzymes: Secondary | ICD-10-CM

## 2021-08-03 DIAGNOSIS — I10 Essential (primary) hypertension: Secondary | ICD-10-CM | POA: Diagnosis not present

## 2021-08-03 DIAGNOSIS — G4733 Obstructive sleep apnea (adult) (pediatric): Secondary | ICD-10-CM | POA: Diagnosis not present

## 2021-08-03 LAB — HEPATIC FUNCTION PANEL
ALT: 90 IU/L — ABNORMAL HIGH (ref 0–44)
AST: 46 IU/L — ABNORMAL HIGH (ref 0–40)
Albumin: 4.4 g/dL (ref 4.0–5.0)
Alkaline Phosphatase: 121 IU/L (ref 44–121)
Bilirubin Total: 0.5 mg/dL (ref 0.0–1.2)
Bilirubin, Direct: 0.13 mg/dL (ref 0.00–0.40)
Total Protein: 6.9 g/dL (ref 6.0–8.5)

## 2021-08-03 NOTE — Telephone Encounter (Signed)
-----   Message from Lin Landsman, MD sent at 08/03/2021 12:36 PM EDT ----- Please inform patient that his liver enzymes are still elevated.  The work-up we have done so far has been negative for any other cause for liver disease.  Ultrasound from last year does not confirm fatty liver as well.  Therefore, I recommend ultrasound-guided liver biopsy if patient is agreeable  Recheck LFTs in 3 months  Rohini Vanga

## 2021-08-03 NOTE — Telephone Encounter (Signed)
Patient verbalized understanding of results. He is okay with Liver biopsy. Order the liver biopsy and faxed form to [facility]

## 2021-08-04 NOTE — Telephone Encounter (Signed)
Got patient schedule for a U/s liver biopsy for 08/12/2021 arrive the medical mall at 7:30am for a 8:30am scan. Patient verbalized understanding of instructions

## 2021-08-09 NOTE — Progress Notes (Signed)
Spoke with patient 08/09/21 @ 11:30 am. Micah Flesher over pre-procedure instructions to include the need to arrive at 7:30 for 8:30 procedure, need to be NPO night prior to procedure and need for driver post-procedure. Patient verbalized understanding.

## 2021-08-10 ENCOUNTER — Ambulatory Visit
Admission: RE | Admit: 2021-08-10 | Discharge: 2021-08-10 | Disposition: A | Discharge status: Home / Self Care | Payer: Medicare HMO | Attending: Gastroenterology | Admitting: Gastroenterology

## 2021-08-10 ENCOUNTER — Ambulatory Visit: Payer: Medicare HMO | Admitting: Anesthesiology

## 2021-08-10 ENCOUNTER — Encounter
Admission: RE | Disposition: A | Discharge status: Home / Self Care | Payer: Self-pay | Source: Home / Self Care | Attending: Gastroenterology

## 2021-08-10 ENCOUNTER — Encounter: Payer: Self-pay | Admitting: Gastroenterology

## 2021-08-10 ENCOUNTER — Other Ambulatory Visit: Payer: Self-pay

## 2021-08-10 DIAGNOSIS — Z86010 Personal history of colonic polyps: Secondary | ICD-10-CM | POA: Insufficient documentation

## 2021-08-10 DIAGNOSIS — K449 Diaphragmatic hernia without obstruction or gangrene: Secondary | ICD-10-CM | POA: Insufficient documentation

## 2021-08-10 DIAGNOSIS — G4733 Obstructive sleep apnea (adult) (pediatric): Secondary | ICD-10-CM | POA: Insufficient documentation

## 2021-08-10 DIAGNOSIS — Z1211 Encounter for screening for malignant neoplasm of colon: Secondary | ICD-10-CM | POA: Diagnosis not present

## 2021-08-10 DIAGNOSIS — F419 Anxiety disorder, unspecified: Secondary | ICD-10-CM | POA: Diagnosis not present

## 2021-08-10 DIAGNOSIS — R519 Headache, unspecified: Secondary | ICD-10-CM | POA: Insufficient documentation

## 2021-08-10 DIAGNOSIS — I10 Essential (primary) hypertension: Secondary | ICD-10-CM | POA: Insufficient documentation

## 2021-08-10 DIAGNOSIS — F32A Depression, unspecified: Secondary | ICD-10-CM | POA: Insufficient documentation

## 2021-08-10 DIAGNOSIS — K219 Gastro-esophageal reflux disease without esophagitis: Secondary | ICD-10-CM | POA: Insufficient documentation

## 2021-08-10 DIAGNOSIS — K644 Residual hemorrhoidal skin tags: Secondary | ICD-10-CM | POA: Insufficient documentation

## 2021-08-10 HISTORY — PX: COLONOSCOPY WITH PROPOFOL: SHX5780

## 2021-08-10 SURGERY — COLONOSCOPY WITH PROPOFOL
Anesthesia: General

## 2021-08-10 MED ORDER — PROPOFOL 10 MG/ML IV BOLUS
INTRAVENOUS | Status: DC | PRN
  Administered 2021-08-10: 50 mg via INTRAVENOUS
  Administered 2021-08-10: 150 mg via INTRAVENOUS
  Administered 2021-08-10: 40 mg via INTRAVENOUS

## 2021-08-10 MED ORDER — LIDOCAINE HCL (CARDIAC) PF 100 MG/5ML IV SOSY
PREFILLED_SYRINGE | INTRAVENOUS | Status: DC | PRN
  Administered 2021-08-10: 30 mg via INTRAVENOUS

## 2021-08-10 MED ORDER — LACTATED RINGERS IV SOLN: INTRAVENOUS | Status: DC

## 2021-08-10 MED ORDER — STERILE WATER FOR IRRIGATION IR SOLN
Status: DC | PRN
  Administered 2021-08-10: 1000 mL

## 2021-08-10 MED ORDER — ACETAMINOPHEN 500 MG PO TABS: 1000.0000 mg | ORAL_TABLET | Freq: Once | ORAL | Status: DC | PRN

## 2021-08-10 MED ORDER — ONDANSETRON HCL 4 MG/2ML IJ SOLN
4.0000 mg | Freq: Once | INTRAMUSCULAR | Status: DC | PRN
Start: 2021-08-10 — End: 2021-08-10

## 2021-08-10 MED ORDER — ACETAMINOPHEN 160 MG/5ML PO SOLN: 975.0000 mg | Freq: Once | ORAL | Status: DC | PRN

## 2021-08-10 MED ORDER — SODIUM CHLORIDE 0.9 % IV SOLN: INTRAVENOUS | Status: DC

## 2021-08-10 SURGICAL SUPPLY — 26 items
CLIP HMST 235XBRD CATH ROT (MISCELLANEOUS) IMPLANT
CLIP RESOLUTION 360 11X235 (MISCELLANEOUS)
ELECT REM PT RETURN 9FT ADLT (ELECTROSURGICAL)
ELECTRODE REM PT RTRN 9FT ADLT (ELECTROSURGICAL) IMPLANT
FCP ESCP3.2XJMB 240X2.8X (MISCELLANEOUS)
FORCEPS BIOP RAD 4 LRG CAP 4 (CUTTING FORCEPS) IMPLANT
FORCEPS BIOP RJ4 240 W/NDL (MISCELLANEOUS)
FORCEPS ESCP3.2XJMB 240X2.8X (MISCELLANEOUS) IMPLANT
GOWN CVR UNV OPN BCK APRN NK (MISCELLANEOUS) ×2 IMPLANT
GOWN ISOL THUMB LOOP REG UNIV (MISCELLANEOUS) ×4
INJECTOR VARIJECT VIN23 (MISCELLANEOUS) IMPLANT
KIT DEFENDO VALVE AND CONN (KITS) IMPLANT
KIT PRC NS LF DISP ENDO (KITS) ×1 IMPLANT
KIT PROCEDURE OLYMPUS (KITS) ×2
MANIFOLD NEPTUNE II (INSTRUMENTS) ×2 IMPLANT
MARKER SPOT ENDO TATTOO 5ML (MISCELLANEOUS) IMPLANT
PROBE APC STR FIRE (PROBE) IMPLANT
RETRIEVER NET ROTH 2.5X230 LF (MISCELLANEOUS) IMPLANT
SNARE COLD EXACTO (MISCELLANEOUS) IMPLANT
SNARE SHORT THROW 13M SML OVAL (MISCELLANEOUS) IMPLANT
SNARE SHORT THROW 30M LRG OVAL (MISCELLANEOUS) IMPLANT
SNARE SNG USE RND 15MM (INSTRUMENTS) IMPLANT
SPOT EX ENDOSCOPIC TATTOO (MISCELLANEOUS)
TRAP ETRAP POLY (MISCELLANEOUS) IMPLANT
VARIJECT INJECTOR VIN23 (MISCELLANEOUS)
WATER STERILE IRR 250ML POUR (IV SOLUTION) ×2 IMPLANT

## 2021-08-10 NOTE — Transfer of Care (Signed)
Immediate Anesthesia Transfer of Care Note  Patient: John [name]  Procedure(s) Performed: COLONOSCOPY WITH PROPOFOL  Patient Location: PACU  Anesthesia Type: General  Level of Consciousness: awake, alert  and patient cooperative  Airway and Oxygen Therapy: Patient Spontanous Breathing and Patient connected to supplemental oxygen  Post-op Assessment: Post-op Vital signs reviewed, Patient's Cardiovascular Status Stable, Respiratory Function Stable, Patent Airway and No signs of Nausea or vomiting  Post-op Vital Signs: Reviewed and stable  Complications: No notable events documented.

## 2021-08-11 ENCOUNTER — Other Ambulatory Visit: Payer: Self-pay | Admitting: Radiology

## 2021-08-11 ENCOUNTER — Encounter: Payer: Self-pay | Admitting: Gastroenterology

## 2021-08-11 DIAGNOSIS — R7989 Other specified abnormal findings of blood chemistry: Secondary | ICD-10-CM

## 2021-08-12 ENCOUNTER — Ambulatory Visit
Admission: RE | Admit: 2021-08-12 | Discharge: 2021-08-12 | Disposition: A | Discharge status: Home / Self Care | Payer: Medicare HMO | Source: Ambulatory Visit | Attending: Gastroenterology | Admitting: Gastroenterology

## 2021-08-12 ENCOUNTER — Other Ambulatory Visit: Payer: Self-pay

## 2021-08-12 DIAGNOSIS — R7989 Other specified abnormal findings of blood chemistry: Secondary | ICD-10-CM | POA: Diagnosis not present

## 2021-08-12 DIAGNOSIS — K8020 Calculus of gallbladder without cholecystitis without obstruction: Secondary | ICD-10-CM | POA: Insufficient documentation

## 2021-08-12 DIAGNOSIS — R748 Abnormal levels of other serum enzymes: Secondary | ICD-10-CM | POA: Diagnosis present

## 2021-08-12 DIAGNOSIS — R945 Abnormal results of liver function studies: Secondary | ICD-10-CM | POA: Diagnosis not present

## 2021-08-12 DIAGNOSIS — I10 Essential (primary) hypertension: Secondary | ICD-10-CM | POA: Insufficient documentation

## 2021-08-12 DIAGNOSIS — R519 Headache, unspecified: Secondary | ICD-10-CM | POA: Insufficient documentation

## 2021-08-12 DIAGNOSIS — N200 Calculus of kidney: Secondary | ICD-10-CM | POA: Insufficient documentation

## 2021-08-12 DIAGNOSIS — G4733 Obstructive sleep apnea (adult) (pediatric): Secondary | ICD-10-CM | POA: Diagnosis not present

## 2021-08-12 DIAGNOSIS — R011 Cardiac murmur, unspecified: Secondary | ICD-10-CM | POA: Diagnosis not present

## 2021-08-12 LAB — CBC
HCT: 45.2 % (ref 39.0–52.0)
Hemoglobin: 15 g/dL (ref 13.0–17.0)
MCH: 30.1 pg (ref 26.0–34.0)
MCHC: 33.2 g/dL (ref 30.0–36.0)
MCV: 90.6 fL (ref 80.0–100.0)
Platelets: 257 10*3/uL (ref 150–400)
RBC: 4.99 MIL/uL (ref 4.22–5.81)
RDW: 12.6 % (ref 11.5–15.5)
WBC: 5.7 10*3/uL (ref 4.0–10.5)
nRBC: 0 % (ref 0.0–0.2)

## 2021-08-12 LAB — PROTIME-INR
INR: 1 (ref 0.8–1.2)
Prothrombin Time: 12.7 seconds (ref 11.4–15.2)

## 2021-08-12 MED ORDER — FENTANYL CITRATE (PF) 100 MCG/2ML IJ SOLN
INTRAMUSCULAR | Status: AC
  Filled 2021-08-12: qty 2

## 2021-08-12 MED ORDER — FENTANYL CITRATE (PF) 100 MCG/2ML IJ SOLN
INTRAMUSCULAR | Status: AC | PRN
  Administered 2021-08-12: 50 ug via INTRAVENOUS

## 2021-08-12 MED ORDER — SODIUM CHLORIDE 0.9 % IV SOLN: INTRAVENOUS | Status: DC

## 2021-08-12 MED ORDER — MIDAZOLAM HCL 2 MG/2ML IJ SOLN
INTRAMUSCULAR | Status: AC
  Filled 2021-08-12: qty 2

## 2021-08-12 MED ORDER — MIDAZOLAM HCL 2 MG/2ML IJ SOLN
INTRAMUSCULAR | Status: AC | PRN
  Administered 2021-08-12: 2 mg via INTRAVENOUS

## 2021-08-12 NOTE — H&P (Signed)
Chief Complaint: Patient was seen in consultation today for elevated liver function tests at the request of John [name]  Referring Physician(s): John [name]  History of Present Illness: John [name] is a 51 y.o. male with medical history significant for chronic headaches, barrett esophagus, depressions, childhood cardiac murmur, kidney stones, HTN, cholelithiasis, and OSA who presents to IR today with unexplained elevated LFTs.   He reports approximately 6 months of feeling unwell with decreased energy, increased sleepiness, pain in upper arms and upper legs and low back pain.  He says this began after a viral like illness around Christmas and does not attribute these symptoms to anything in particular.  Today, he endorses dull achey pain of the low back and extremities.  He denies HA, dizziness, SOB, CP, palpitations, abdominal pain, change in bowel habits, change in urinary habits, gait disturbance or paraesthesias.  He is accompanied by daughter who will drive him home today.  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   Symptomatic cholelithiasis 05/02/2016    Past Surgical History:  Procedure Laterality Date   APPENDECTOMY  1999   CHOLECYSTECTOMY  05/02/2016   LAPROSCOPIC   CHOLECYSTECTOMY N/A 05/02/2016   Procedure: LAPAROSCOPIC CHOLECYSTECTOMY WITH INTRAOPERATIVE CHOLANGIOGRAM;  Surgeon: John Skinner, MD;  Location: [facility];  Service: General;  Laterality: N/A;   COLONOSCOPY WITH PROPOFOL N/A 05/15/2020    Procedure: COLONOSCOPY WITH PROPOFOL;  Surgeon: John Manifold, MD;  Location: [facility];  Service: Endoscopy;  Laterality: N/A;   COLONOSCOPY WITH PROPOFOL N/A 08/10/2021   Procedure: COLONOSCOPY WITH PROPOFOL;  Surgeon: John Landsman, MD;  Location: [facility];  Service: Endoscopy;  Laterality: N/A;  sleep apnea   ESOPHAGEAL MANOMETRY N/A 11/12/2012   Procedure: ESOPHAGEAL MANOMETRY (EM);  Surgeon: John Beams, MD;  Location: [facility];  Service: Endoscopy;  Laterality: N/A;   ESOPHAGOGASTRODUODENOSCOPY (EGD) WITH PROPOFOL N/A 05/15/2020   Procedure: ESOPHAGOGASTRODUODENOSCOPY (EGD) WITH PROPOFOL;  Surgeon: John Manifold, MD;  Location: [facility];  Service: Endoscopy;  Laterality: N/A;   KNEE ARTHROSCOPY Right 04/18/2014   Procedure: RIGHT ARTHROSCOPY KNEE / PLICA EXCISION;  Surgeon: John Hai, MD;  Location: [facility];  Service: Orthopedics;  Laterality: Right;   LAPAROSCOPIC NISSEN FUNDOPLICATION N/A 03/12/5168   Procedure: LAPAROSCOPIC NISSEN FUNDOPLICATION;  Surgeon: John Earls, MD;  Location: [facility];  Service: General;  Laterality: N/A;   NASAL SEPTUM SURGERY  2002   UPPER GI ENDOSCOPY  2013    Allergies: Doxycycline and Nitroglycerin  Medications: Prior to Admission medications   Medication Sig Start Date End Date Taking? Authorizing Provider  amLODipine (NORVASC) 5 MG tablet Take 5 mg by mouth daily. 03/10/20  Yes [provider]  baclofen (LIORESAL) 10 MG tablet Take by mouth.   Yes [provider]  buPROPion (WELLBUTRIN XL) 300 MG 24 hr tablet Take 300 mg by mouth daily. 06/15/21  Yes [provider]  cetirizine (ZYRTEC) 10 MG tablet Take 1 tablet by mouth daily. 02/07/20  Yes [provider]  Diclofenac Potassium,Migraine, 50 MG PACK Take 50 mg by mouth 2 (two) times daily as needed. Migraines   Yes [provider]  esomeprazole (NEXIUM) 40 MG capsule Take 40 mg by mouth  daily before breakfast.   Yes [provider]  hydrOXYzine (ATARAX) 10 MG tablet Take 10 mg by mouth at bedtime as needed. 06/03/21  Yes [provider]  ketoconazole (NIZORAL) 2 % shampoo Apply topically daily. 07/13/21  Yes [provider]  lisinopril (ZESTRIL) 5 MG tablet Take 1 tablet by mouth daily. 03/05/21  Yes [provider]  magnesium oxide (MAG-OX) 400 MG tablet Take 2 tablets by mouth daily. 04/19/21  Yes [provider]  meloxicam (MOBIC) 15 MG tablet Take 15 mg by mouth daily. 07/12/21  Yes [provider]  tiZANidine (ZANAFLEX) 4 MG tablet Take 4 mg by mouth 3 (three) times daily. 07/08/21  Yes [provider]  venlafaxine XR (EFFEXOR-XR) 150 MG 24 hr capsule Take 75 mg by mouth daily. 07/19/21  Yes [provider]     Family History  Problem Relation Age of Onset   Cancer Father        skin   Diabetes Father    Cancer Mother        pancreatic 2010   Hypertension Mother    Cancer Paternal Aunt        breast    Social History   Socioeconomic History   Marital status: Divorced    Spouse name: Not on file   Number of children: 1   Years of education: 14   Highest education level: Not on file  Occupational History   Occupation: Disabled    Comment: Chronic Headaches  Tobacco Use   Smoking status: Never   Smokeless tobacco: Never  Vaping Use   Vaping Use: Never used  Substance and Sexual Activity   Alcohol use: No   Drug use: No   Sexual activity: Not on file  Other Topics Concern   Not on file  Social History Narrative   John "Ed" grew up in [place]. He lives at home with his wife and daughter. He used to work in [employer]. Now he is disabled 2/2 severe, chronic headaches. He enjoys playing video games occasional with his daughter. Enjoys watching movies.      No caffeine   No smoking   Does not exercise   Social Determinants of Health   Financial Resource Strain: Not on file   Food Insecurity: Not on file  Transportation Needs: Not on file  Physical Activity: Not on file  Stress: Not on file  Social Connections: Not on file    Review of Systems: A 12 point ROS discussed and pertinent positives are indicated in the HPI above.  All other systems are negative.   Vital Signs: BP 128/89   Pulse 74   Temp 98.3 F (36.8 C) (Oral)   Resp 17   Ht '5\' 8"'$  (1.727 m)   Wt 203 lb (92.1 kg)   SpO2 94%   BMI 30.87 kg/m     Physical Exam Vitals reviewed.  Constitutional:      General: He is not in acute distress. HENT:     Head: Normocephalic.     Mouth/Throat:     Mouth: Mucous membranes are moist.     Pharynx: Oropharynx is clear.  Eyes:     Extraocular Movements: Extraocular movements intact.     Conjunctiva/sclera: Conjunctivae normal.  Cardiovascular:     Rate and Rhythm: Normal rate and regular rhythm.     Pulses: Normal pulses.     Heart sounds: Normal heart sounds.  Pulmonary:     Effort: Pulmonary effort is normal.     Breath sounds: Normal breath sounds.  Abdominal:     General: Abdomen is flat.     Palpations: Abdomen is soft.  Musculoskeletal:        General: No swelling or tenderness.  Skin:    General: Skin is warm and dry.     Capillary Refill: Capillary refill takes less than 2 seconds.  Neurological:     General: No focal deficit present.     Mental Status: He is alert and oriented to person, place, and time.  Psychiatric:        Mood and Affect: Mood normal.        Behavior: Behavior normal.     Mallampati Score: II   Imaging: No results found.  Labs:  CBC: Recent Labs    11/22/20 2200 08/12/21 0810  WBC 8.0 5.7  HGB 16.0 15.0  HCT 44.3 45.2  PLT 293 257    BMP: Recent Labs    11/22/20 2200  NA 135  K 3.7  CL 98  CO2 28  GLUCOSE 102*  BUN 17  CALCIUM 9.5  CREATININE 1.06  GFRNONAA >60    LIVER FUNCTION TESTS: Recent Labs    08/02/21 1505  BILITOT 0.5  AST 46*  ALT 90*  ALKPHOS 121  PROT  6.9  ALBUMIN 4.4    Assessment and Plan:  Elevated LFTs --for random liver biopsy today with planned d/c home --NPO, no thinners, has driver, --OK to proceed  Risks and benefits of biopsy was discussed with the patient and/or patient's family including, but not limited to bleeding, infection, damage to adjacent structures or low yield requiring additional tests.  All of the questions were answered and there is agreement to proceed.  Consent signed and in chart.   Thank you for this interesting consult.  I greatly enjoyed meeting Demone Lyles Zalesky [name] and look forward to participating in their care.  A copy of this report was sent to the requesting provider on this date.  Electronically Signed: Pasty Spillers 08/12/2021, 8:37 AM   I spent a total of 30 Minutes  in face to face in clinical consultation, greater than 50% of which was counseling/coordinating care for liver biopsy

## 2021-08-12 NOTE — Procedures (Signed)
Interventional Radiology Procedure Note  Procedure: US guided random liver biopsy  Complications: None  Estimated Blood Loss: None  Recommendations: - DC home in 2 hrs   Signed,  Criselda Peaches, MD

## 2021-08-16 LAB — SURGICAL PATHOLOGY

## 2021-08-20 DIAGNOSIS — I10 Essential (primary) hypertension: Secondary | ICD-10-CM | POA: Diagnosis not present

## 2021-08-20 DIAGNOSIS — R0681 Apnea, not elsewhere classified: Secondary | ICD-10-CM | POA: Diagnosis not present

## 2021-08-20 DIAGNOSIS — G4733 Obstructive sleep apnea (adult) (pediatric): Secondary | ICD-10-CM | POA: Diagnosis not present

## 2021-08-23 ENCOUNTER — Telehealth: Payer: Self-pay

## 2021-08-23 NOTE — Telephone Encounter (Signed)
Called and left a message for call back  

## 2021-08-23 NOTE — Telephone Encounter (Signed)
-----   Message from Lin Landsman, MD sent at 08/23/2021  4:38 PM EDT ----- Please inform patient that the ultrasound-guided liver biopsy confirms fatty liver.  I recommend clinic follow-up in 6 months.  I recommend healthy lifestyle, diet and exercise  RV

## 2021-08-24 NOTE — Telephone Encounter (Signed)
Made appointment for patient and patient verbalized understanding of results

## 2021-09-16 DIAGNOSIS — M5412 Radiculopathy, cervical region: Secondary | ICD-10-CM | POA: Diagnosis not present

## 2021-09-16 DIAGNOSIS — Z1389 Encounter for screening for other disorder: Secondary | ICD-10-CM | POA: Diagnosis not present

## 2021-09-16 DIAGNOSIS — R5383 Other fatigue: Secondary | ICD-10-CM | POA: Diagnosis not present

## 2021-09-16 DIAGNOSIS — Z0130 Encounter for examination of blood pressure without abnormal findings: Secondary | ICD-10-CM | POA: Diagnosis not present

## 2021-09-16 DIAGNOSIS — Z0131 Encounter for examination of blood pressure with abnormal findings: Secondary | ICD-10-CM | POA: Diagnosis not present

## 2021-09-20 DIAGNOSIS — I10 Essential (primary) hypertension: Secondary | ICD-10-CM | POA: Diagnosis not present

## 2021-09-20 DIAGNOSIS — G4733 Obstructive sleep apnea (adult) (pediatric): Secondary | ICD-10-CM | POA: Diagnosis not present

## 2021-09-20 DIAGNOSIS — R0681 Apnea, not elsewhere classified: Secondary | ICD-10-CM | POA: Diagnosis not present

## 2021-10-14 DIAGNOSIS — G4700 Insomnia, unspecified: Secondary | ICD-10-CM | POA: Diagnosis not present

## 2021-10-14 DIAGNOSIS — Z1389 Encounter for screening for other disorder: Secondary | ICD-10-CM | POA: Diagnosis not present

## 2021-10-14 DIAGNOSIS — Z0130 Encounter for examination of blood pressure without abnormal findings: Secondary | ICD-10-CM | POA: Diagnosis not present

## 2021-10-21 DIAGNOSIS — R0681 Apnea, not elsewhere classified: Secondary | ICD-10-CM | POA: Diagnosis not present

## 2021-10-21 DIAGNOSIS — G4733 Obstructive sleep apnea (adult) (pediatric): Secondary | ICD-10-CM | POA: Diagnosis not present

## 2021-10-21 DIAGNOSIS — I10 Essential (primary) hypertension: Secondary | ICD-10-CM | POA: Diagnosis not present

## 2021-10-25 DIAGNOSIS — M5412 Radiculopathy, cervical region: Secondary | ICD-10-CM | POA: Diagnosis not present

## 2021-11-02 DIAGNOSIS — G4733 Obstructive sleep apnea (adult) (pediatric): Secondary | ICD-10-CM | POA: Diagnosis not present

## 2021-11-02 DIAGNOSIS — R0681 Apnea, not elsewhere classified: Secondary | ICD-10-CM | POA: Diagnosis not present

## 2021-11-02 DIAGNOSIS — I10 Essential (primary) hypertension: Secondary | ICD-10-CM | POA: Diagnosis not present

## 2021-11-03 DIAGNOSIS — M4723 Other spondylosis with radiculopathy, cervicothoracic region: Secondary | ICD-10-CM | POA: Diagnosis not present

## 2021-11-03 DIAGNOSIS — M4802 Spinal stenosis, cervical region: Secondary | ICD-10-CM | POA: Diagnosis not present

## 2021-11-03 DIAGNOSIS — M50322 Other cervical disc degeneration at C5-C6 level: Secondary | ICD-10-CM | POA: Diagnosis not present

## 2021-11-03 DIAGNOSIS — M5412 Radiculopathy, cervical region: Secondary | ICD-10-CM | POA: Diagnosis not present

## 2021-11-03 DIAGNOSIS — M4722 Other spondylosis with radiculopathy, cervical region: Secondary | ICD-10-CM | POA: Diagnosis not present

## 2021-11-29 IMAGING — CR DG CHEST 2V
1 series · 2 of 2 positions shown · non-contrast
Comparison: 10/16/2012.

CLINICAL DATA: History of pneumonia.  Fevers, chills, body aches.

EXAM:
CHEST - 2 VIEW

[Series 1: dg chest 2 view · 0.14mm/px · 2 of 2 slices shown]
[im 1/2]
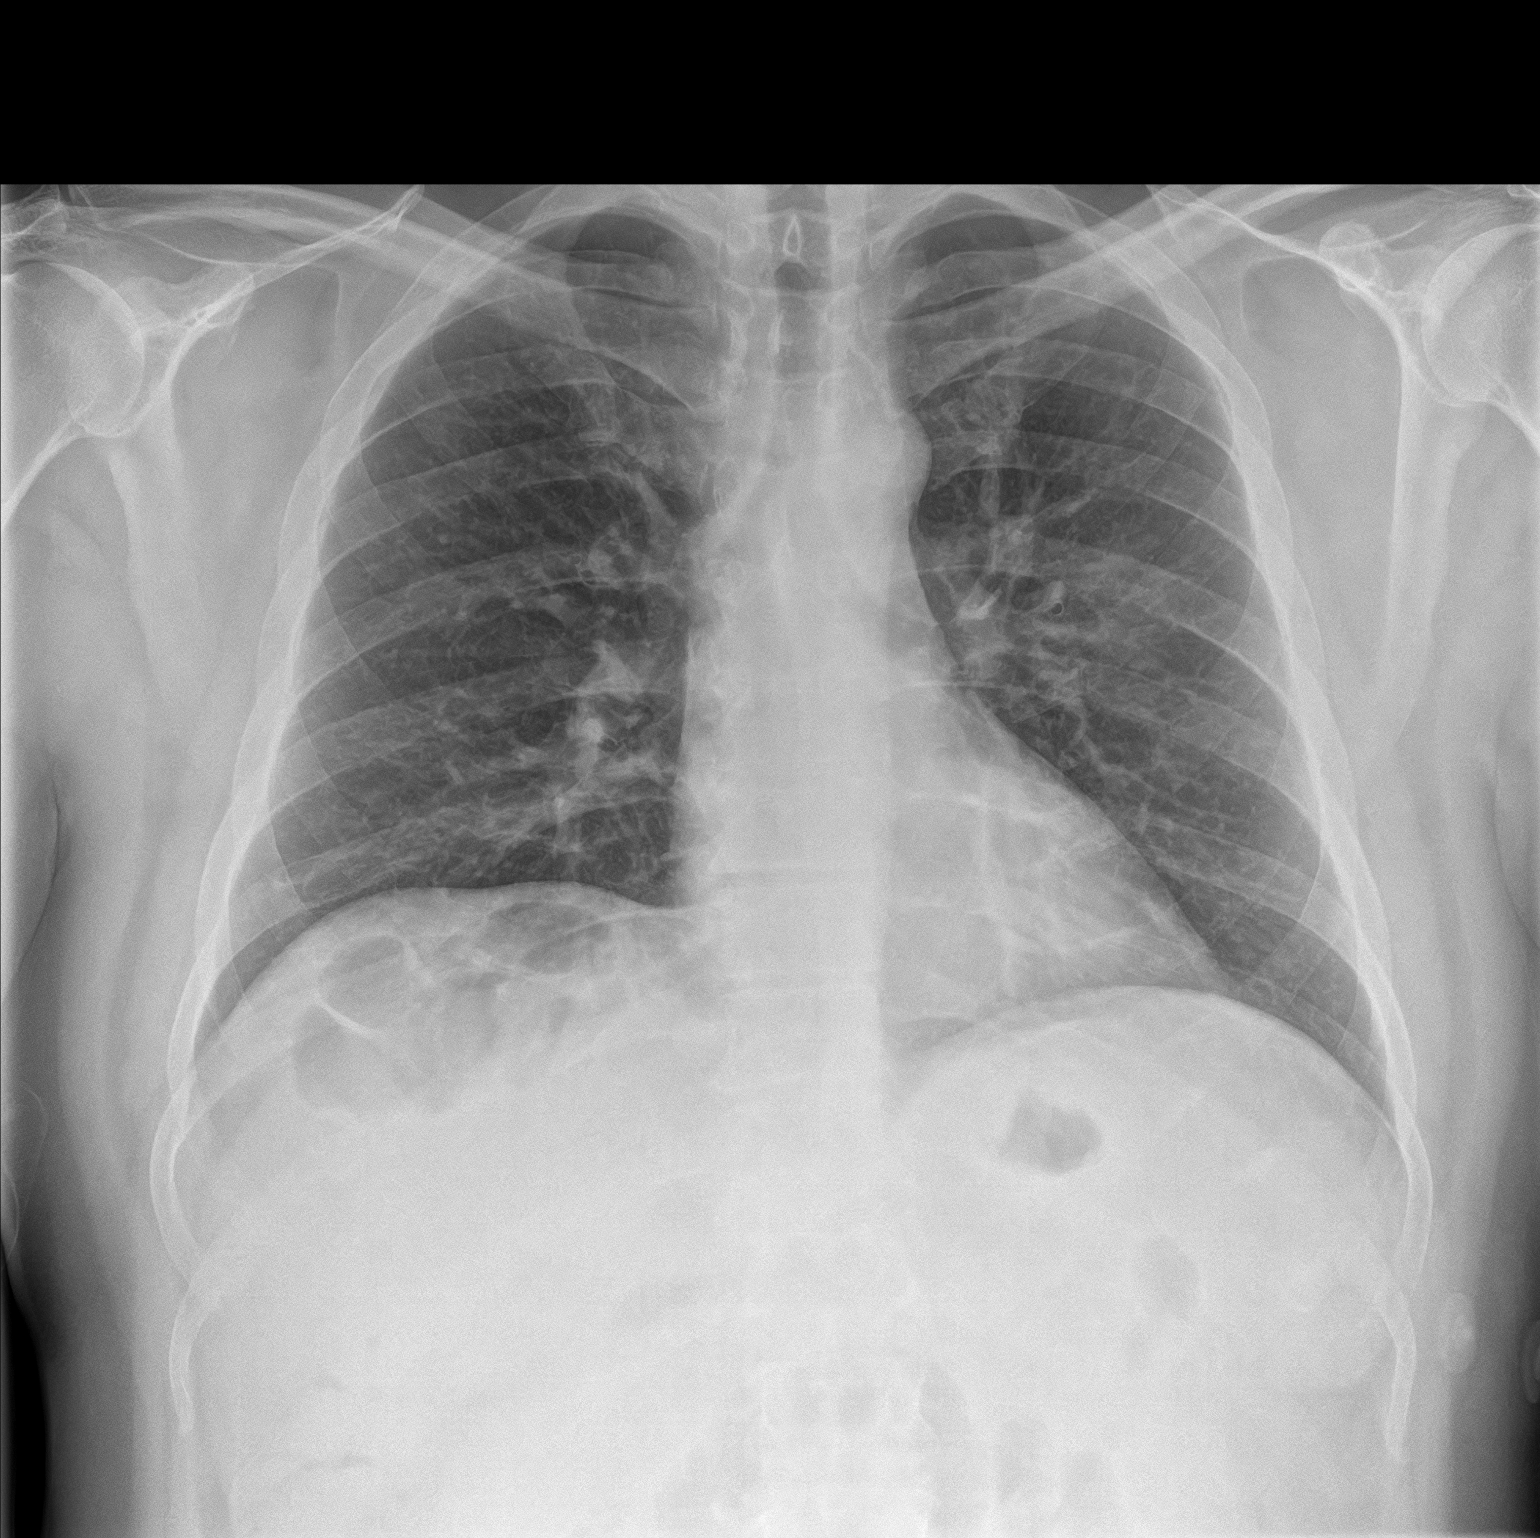
[im 2/2]
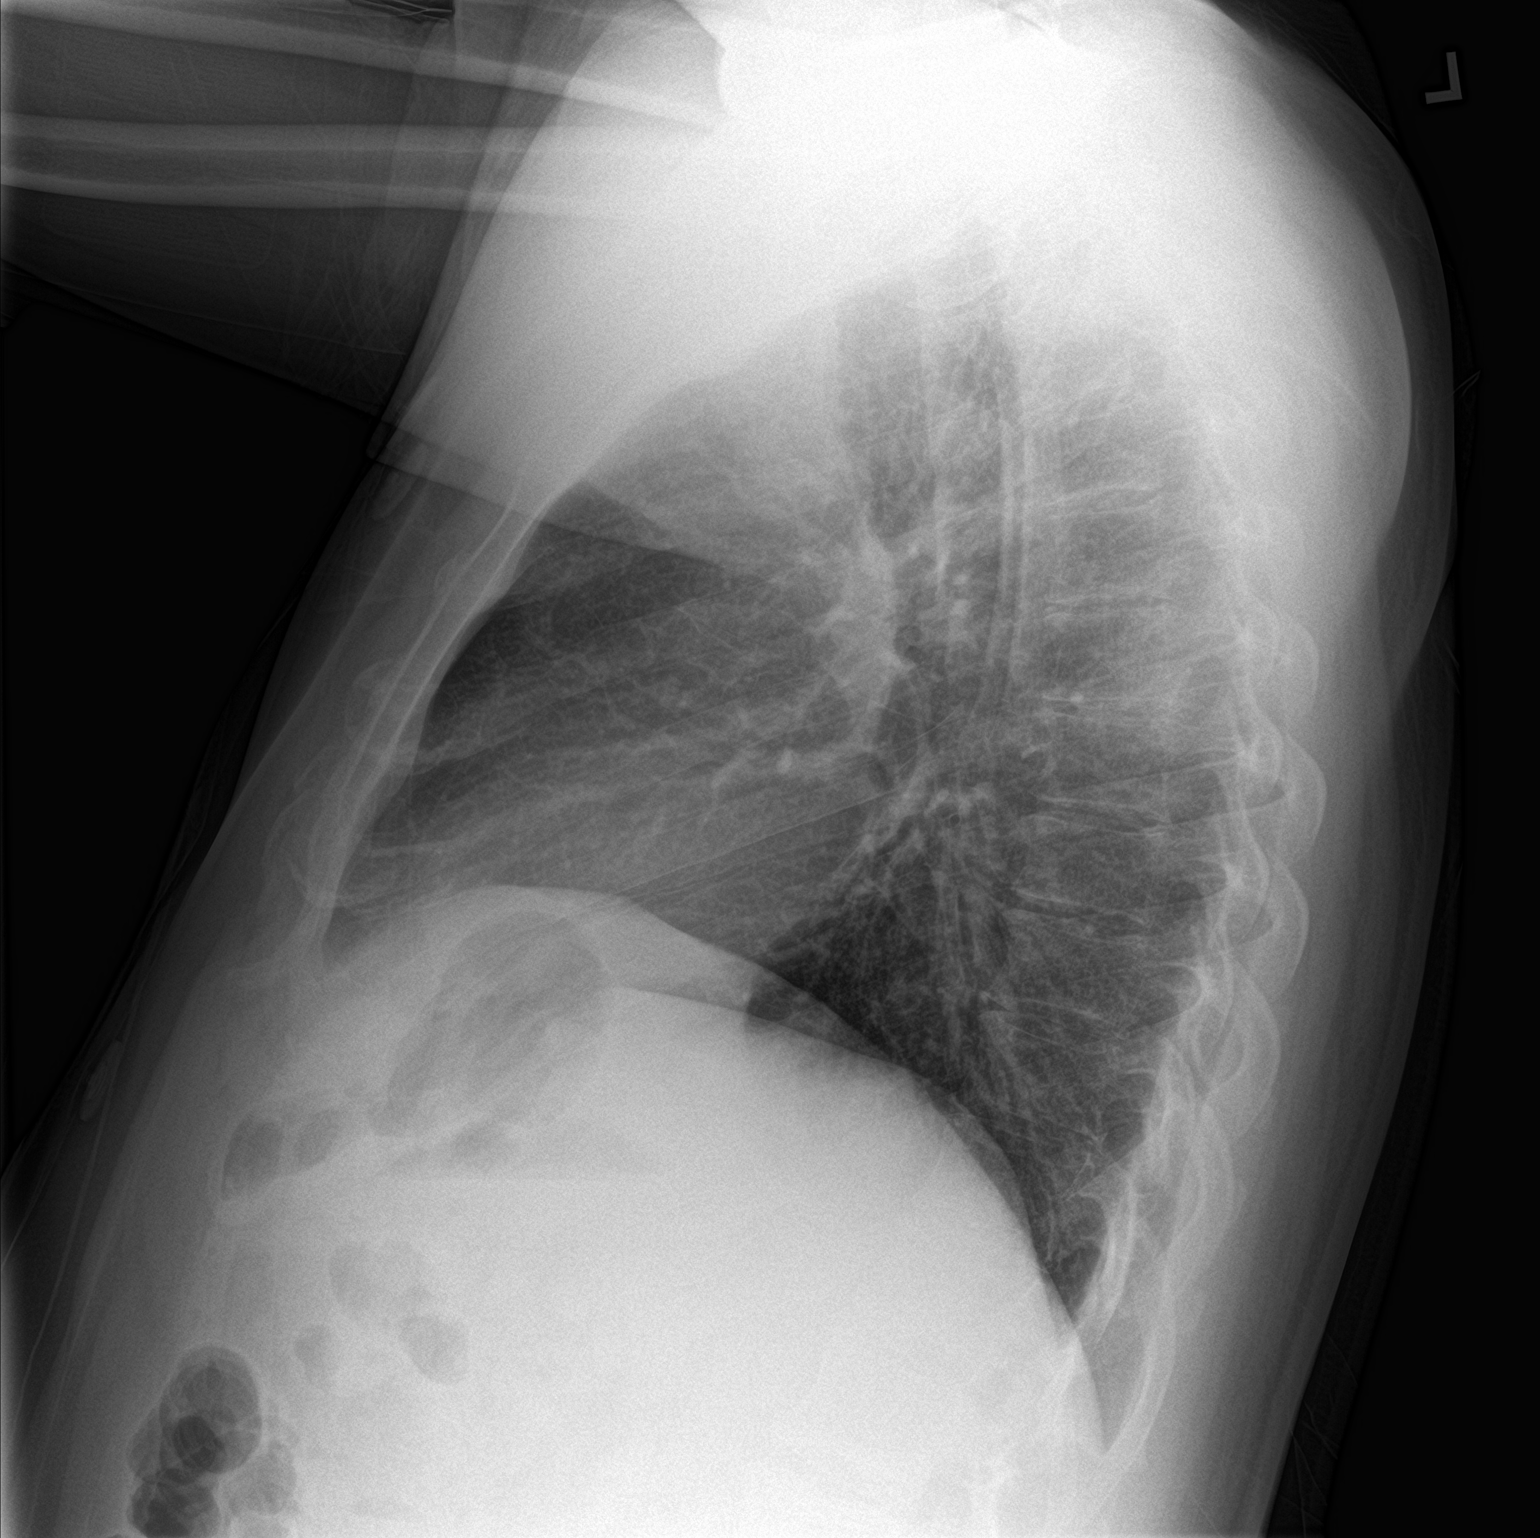

[2 of 2 positions shown; findings below may reference images not displayed]

FINDINGS: The heart size and mediastinal contours are within normal limits.
Subtle opacity in left midlung. No visible pleural effusions or
pneumothorax. No acute osseous abnormality.
IMPRESSION: Subtle opacity in left midlung, which may represent early pneumonia.

## 2021-12-15 DIAGNOSIS — R519 Headache, unspecified: Secondary | ICD-10-CM | POA: Diagnosis not present

## 2022-01-31 ENCOUNTER — Ambulatory Visit: Payer: Medicare HMO | Admitting: Gastroenterology

## 2022-01-31 ENCOUNTER — Encounter: Payer: Self-pay | Admitting: Gastroenterology

## 2022-01-31 ENCOUNTER — Other Ambulatory Visit: Payer: Self-pay

## 2022-01-31 VITALS — BP 142/93 | HR 94 | Temp 98.5°F | Ht 69.0 in | Wt 215.4 lb

## 2022-01-31 DIAGNOSIS — R748 Abnormal levels of other serum enzymes: Secondary | ICD-10-CM | POA: Diagnosis not present

## 2022-01-31 DIAGNOSIS — K2270 Barrett's esophagus without dysplasia: Secondary | ICD-10-CM

## 2022-01-31 DIAGNOSIS — Z86010 Personal history of colonic polyps: Secondary | ICD-10-CM | POA: Diagnosis not present

## 2022-01-31 DIAGNOSIS — K760 Fatty (change of) liver, not elsewhere classified: Secondary | ICD-10-CM | POA: Diagnosis not present

## 2022-01-31 NOTE — Progress Notes (Signed)
[name], MD [address]  [employer]  [place], [place] [address]  Main: [phone]  Fax: [phone]    Gastroenterology Consultation  Referring Provider:     Services, [place] Physician:  Services, [place] Primary Gastroenterologist:  Dr. [name] Reason for Consultation: Barrett's esophagus, history of colon polyps, fatty liver   HPI:   John [name] is a 51 y.o. 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  Patient had a colonoscopy 6 months ago with 2-day prep which was still inadequate.  He has personal history of tubular adenomas of the colon.  Patient had mildly elevated LFTs, underwent secondary liver disease work-up which was unremarkable.  Right upper quadrant ultrasound was unremarkable.  Ultrasound-guided liver biopsy confirmed macrovesicular steatosis.  Patient has chronic headaches, closely followed by neurology.  No evidence of iron deficiency anemia.  He takes meloxicam daily at bedtime.  He takes Nexium 40 mg once a day before breakfast, long-term  Follow-up visit 03/03/2021 Patient is here for follow-up of fatty liver.  He underwent liver biopsy in 07/2021 confirmed fatty liver with no evidence of fibrosis, minimal inflammation.  Patient reports that he tends to have snack at bedtime, he has gained about 5 pounds.  He is going to stress in his personal life, separated from his wife.  He is also due for his surveillance colonoscopy.  He reports that he has been having 2-3 soft bowel movements daily.  His lisinopril has been decreased from '10mg'$  to 5 mg and amlodipine 5 mg has been added due to concern that lisinopril might be causing his loose stools.  He does not have any other GI concerns.  NSAIDs: Meloxicam  Antiplts/Anticoagulants/Anti thrombotics: None  GI Procedures:  Colonoscopy 08/10/2021 - Preparation of the colon  was inadequate. - Perianal skin tags found on perianal exam. - The examined portion of the ileum was normal. - Stool in the ascending colon and in the cecum. - The examination was otherwise normal. - The distal rectum and anal verge are normal on retroflexion view. - No specimens collected.  EGD and colonoscopy 05/15/2020 - Salmon-colored mucosa suspicious for short-segment Barrett's esophagus. Biopsied. - Erythematous mucosa in the antrum. - A few gastric polyps. Biopsied. - A fundoplication was found. The wrap appears intact. - Mucosal changes in the duodenum. Biopsied. - Nodular mucosa in the duodenal bulb. Biopsied. - Biopsies were obtained in the gastric body, at the incisura and in the gastric antrum.  - Preparation of the colon was fair. - Two 5 mm polyps in the sigmoid colon and in the ascending colon, removed with a cold snare. Resected and retrieved. - The examination was otherwise normal. - The rectum, sigmoid colon, descending colon, transverse colon, ascending colon and cecum are normal. Biopsied. - The distal rectum and anal verge are normal on retroflexion view.  DIAGNOSIS:  A. DUODENUM; COLD BIOPSY:  - ENTERIC MUCOSA WITH PRESERVED VILLOUS ARCHITECTURE AND NO SIGNIFICANT  HISTOPATHOLOGIC CHANGE.  - NEGATIVE FOR FEATURES OF CELIAC, DYSPLASIA, AND MALIGNANCY.   B. DUODENAL BULB; COLD BIOPSY:  - GASTRIC HETEROTOPIA.  - ADJACENT ENTERIC MUCOSA WITH NORMAL VILLOUS ARCHITECTURE AND NO  SIGNIFICANT HISTOPATHOLOGIC CHANGE.  - NEGATIVE FOR FEATURES OF CELIAC, DYSPLASIA, AND MALIGNANCY.   C. 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  - NEGATIVE FOR H.  PYLORI, DYSPLASIA, AND MALIGNANCY.   D. GASTRIC POLYP; COLD BIOPSY:  - FUNDIC GLAND POLYP.  - NEGATIVE FOR H. PYLORI, DYSPLASIA, AND MALIGNANCY.   E. ESOPHAGUS; COLD BIOPSY:  - SQUAMOCOLUMNAR MUCOSA WITH INTESTINAL METAPLASIA AND  FEATURES OF  REFLUX GASTROESOPHAGITIS.  - NEGATIVE FOR DYSPLASIA AND MALIGNANCY.   F. ESOPHAGUS; COLD BIOPSY:  - SQUAMOUS MUCOSA WITH ACANTHOSIS AND SPONGIOSIS, MOST SUGGESTIVE OF  REFLUX ESOPHAGITIS.  - NO INCREASE IN INTRAEPITHELIAL EOSINOPHILS (LESS THAN 2 PER HPF).  - NEGATIVE FOR DYSPLASIA AND MALIGNANCY.   G. COLON, RANDOM; COLD BIOPSY:  - BENIGN COLONIC MUCOSA WITH NO SIGNIFICANT HISTOPATHOLOGIC CHANGE.  - NEGATIVE FOR FEATURES OF MICROSCOPIC COLITIS.  - NEGATIVE FOR DYSPLASIA AND MALIGNANCY.   H. COLON POLYPS X2, ASCENDING AND SIGMOID; COLD SNARE:  - SINGLE FRAGMENT OF TUBULAR ADENOMA.  - SINGLE FRAGMENT OF BENIGN INFLAMMATORY TYPE POLYP.  - NEGATIVE FOR HIGH-GRADE DYSPLASIA AND MALIGNANCY.   Surgical pathology, liver biopsy 08/12/2021 DIAGNOSIS:  A. LIVER; ULTRASOUND-GUIDED CORE NEEDLE BIOPSY:  - BENIGN HEPATIC PARENCHYMA WITH MILD TO MODERATE MACROVESICULAR  STEATOSIS, BALLOONING DEGENERATION, AND FOCAL LOBULAR ACTIVITY.   TOTAL NAS SCORE: 3/8        STEATOSIS - SCORE 1 (5-33%)        LOBULAR INFLAMMATION - SCORE 1/4 (1-2 FOCI/20X FIELD)        BALLOONING DEGENERATION - SCORE 1 (FEW BALLOON CELLS)   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   Symptomatic cholelithiasis 05/02/2016    Past Surgical History:  Procedure Laterality Date   APPENDECTOMY  1999   CHOLECYSTECTOMY  05/02/2016   LAPROSCOPIC   CHOLECYSTECTOMY N/A 05/02/2016   Procedure: LAPAROSCOPIC CHOLECYSTECTOMY WITH INTRAOPERATIVE CHOLANGIOGRAM;  Surgeon:  Mickeal Skinner, MD;  Location: [place];  Service: General;  Laterality: N/A;   COLONOSCOPY WITH PROPOFOL N/A 05/15/2020   Procedure: COLONOSCOPY WITH PROPOFOL;  Surgeon: Virgel Manifold, MD;  Location: [facility];  Service: Endoscopy;  Laterality: N/A;   COLONOSCOPY WITH PROPOFOL N/A 08/10/2021   Procedure: COLONOSCOPY WITH PROPOFOL;  Surgeon: Lin Landsman, MD;  Location: [place];  Service: Endoscopy;  Laterality: N/A;  sleep apnea   ESOPHAGEAL MANOMETRY N/A 11/12/2012   Procedure: ESOPHAGEAL MANOMETRY (EM);  Surgeon: Beryle Beams, MD;  Location: [facility];  Service: Endoscopy;  Laterality: N/A;   ESOPHAGOGASTRODUODENOSCOPY (EGD) WITH PROPOFOL N/A 05/15/2020   Procedure: ESOPHAGOGASTRODUODENOSCOPY (EGD) WITH PROPOFOL;  Surgeon: Virgel Manifold, MD;  Location: [facility];  Service: Endoscopy;  Laterality: N/A;   KNEE ARTHROSCOPY Right 04/18/2014   Procedure: RIGHT ARTHROSCOPY KNEE / PLICA EXCISION;  Surgeon: Johnn Hai, MD;  Location: [place];  Service: Orthopedics;  Laterality: Right;   LAPAROSCOPIC NISSEN FUNDOPLICATION N/A 5/62/1308   Procedure: LAPAROSCOPIC NISSEN FUNDOPLICATION;  Surgeon: Pedro Earls, MD;  Location: [facility];  Service: General;  Laterality: N/A;   NASAL SEPTUM SURGERY  2002   UPPER GI  ENDOSCOPY  2013     Current Outpatient Medications:    amLODipine (NORVASC) 5 MG tablet, Take 5 mg by mouth daily., Disp: , Rfl:    baclofen (LIORESAL) 10 MG tablet, Take by mouth., Disp: , Rfl:    buPROPion (WELLBUTRIN XL) 300 MG 24 hr tablet, Take 300 mg by mouth daily., Disp: , Rfl:    cetirizine (ZYRTEC) 10 MG tablet, Take 1 tablet by mouth daily., Disp: , Rfl:    Diclofenac Potassium,Migraine, 50 MG PACK, Take 50 mg by mouth 2 (two) times daily as needed. Migraines, Disp: , Rfl:    esomeprazole (NEXIUM) 40 MG capsule, Take 40 mg by mouth daily before breakfast., Disp: , Rfl:    hydrOXYzine (ATARAX) 10 MG tablet, Take 10  mg by mouth at bedtime as needed., Disp: , Rfl:    ketoconazole (NIZORAL) 2 % shampoo, Apply topically daily., Disp: , Rfl:    lisinopril (ZESTRIL) 5 MG tablet, Take 1 tablet by mouth daily., Disp: , Rfl:    magnesium oxide (MAG-OX) 400 MG tablet, Take 2 tablets by mouth daily., Disp: , Rfl:    MELATONIN PO, Take 10 mg by mouth daily., Disp: , Rfl:    meloxicam (MOBIC) 15 MG tablet, Take 15 mg by mouth daily., Disp: , Rfl:    tiZANidine (ZANAFLEX) 4 MG tablet, Take 4 mg by mouth 3 (three) times daily., Disp: , Rfl:    venlafaxine XR (EFFEXOR-XR) 75 MG 24 hr capsule, Take 75 mg by mouth daily., Disp: , Rfl:    zolpidem (AMBIEN) 5 MG tablet, Take 5 mg by mouth at bedtime as needed for sleep., Disp: , Rfl:    Family History  Problem Relation Age of Onset   Cancer Father        skin   Diabetes Father    Cancer Mother        pancreatic 2010   Hypertension Mother    Cancer Paternal Aunt        breast     Social History   Tobacco Use   Smoking status: Never   Smokeless tobacco: Never  Vaping Use   Vaping Use: Never used  Substance Use Topics   Alcohol use: No   Drug use: No    Allergies as of 01/31/2022 - Review Complete 01/31/2022  Allergen Reaction Noted   Doxycycline Other (See Comments) 08/30/2012   Nitroglycerin Other (See Comments) 08/30/2012    Review of Systems:    All systems reviewed and negative except where noted in HPI.   Physical Exam:  BP (!) 142/93 (BP Location: Left Arm, Patient Position: Sitting, Cuff Size: Normal)   Pulse 94   Temp 98.5 F (36.9 C) (Oral)   Ht '5\' 9"'$  (1.753 m)   Wt 215 lb 6 oz (97.7 kg)   BMI 31.81 kg/m  No LMP for male patient.  General:   Alert,  Well-developed, well-nourished, pleasant and cooperative in NAD Head:  Normocephalic and atraumatic. Eyes:  Sclera clear, no icterus.   Conjunctiva pink. Ears:  Normal auditory acuity. Nose:  No deformity, discharge, or lesions. Mouth:  No deformity or lesions,oropharynx pink &  moist. Neck:  Supple; no masses or thyromegaly. Lungs:  Respirations even and unlabored.  Clear throughout to auscultation.   No wheezes, crackles, or rhonchi. No acute distress. Heart:  Regular rate and rhythm; no murmurs, clicks, rubs, or gallops. Abdomen:  Normal bowel sounds. Soft, non-tender and non-distended without masses, hepatosplenomegaly or hernias noted.  No guarding or rebound  tenderness.   Rectal: Not performed Msk:  Symmetrical without gross deformities. Good, equal movement & strength bilaterally. Pulses:  Normal pulses noted. Extremities:  No clubbing or edema.  No cyanosis. Neurologic:  Alert and oriented x3;  grossly normal neurologically. Skin:  Intact without significant lesions or rashes. No jaundice. Psych:  Alert and cooperative. Normal mood and affect.  Imaging Studies: Reviewed  Assessment and Plan:   John [name] is a 51 y.o. male with history of chronic headaches, s/p cholecystectomy, hypertension, sleep apnea is seen in for follow-up of short segment Barrett's esophagus without dysplasia, tubular adenoma of the colon, fatty liver  Short segment Barrett's esophagus Recommend surveillance EGD in 2027 Continue Nexium 40 mg daily  Tubular adenoma of the colon Recommend surveillance colonoscopy with 2-day prep.  Recommend MiraLAX prep 2 days before and standard bowel prep the day before colonoscopy.  Patient stated that he will call our office in January after his daughter recovers from bone lesion in her knee.  Fatty liver disease, biopsy-proven based on liver biopsy on 08/12/2021 Secondary liver disease work-up was negative Transaminases have been mildly elevated Reiterated about healthy diet and exercise Recheck LFTs today   Follow up in 6 months  [name], MD

## 2022-02-01 LAB — HEPATIC FUNCTION PANEL
ALT: 171 IU/L — ABNORMAL HIGH (ref 0–44)
AST: 86 IU/L — ABNORMAL HIGH (ref 0–40)
Albumin: 4.6 g/dL (ref 4.1–5.1)
Alkaline Phosphatase: 145 IU/L — ABNORMAL HIGH (ref 44–121)
Bilirubin Total: 0.4 mg/dL (ref 0.0–1.2)
Bilirubin, Direct: 0.13 mg/dL (ref 0.00–0.40)
Total Protein: 6.9 g/dL (ref 6.0–8.5)

## 2022-02-02 ENCOUNTER — Telehealth: Payer: Self-pay

## 2022-02-02 DIAGNOSIS — R748 Abnormal levels of other serum enzymes: Secondary | ICD-10-CM

## 2022-02-02 NOTE — Telephone Encounter (Signed)
Patient verbalized understanding of results. He states he will come for labs one day

## 2022-02-02 NOTE — Telephone Encounter (Signed)
-----   Message from Lin Landsman, MD sent at 02/01/2022  4:27 PM EST ----- Please inform patient that his liver enzymes continue to remain elevated.  This is secondary to fatty liver.  There is one other blood test that we did not perform to look for other causes of liver disease. This is for alpha-1 antitrypsin deficiency.  If patient is agreeable, please order the blood test for alpha-1 antitrypsin  RV

## 2022-02-17 DIAGNOSIS — R748 Abnormal levels of other serum enzymes: Secondary | ICD-10-CM | POA: Diagnosis not present

## 2022-02-18 LAB — ALPHA-1-ANTITRYPSIN: A-1 Antitrypsin: 119 mg/dL (ref 101–187)

## 2022-04-02 ENCOUNTER — Emergency Department
Admission: EM | Admit: 2022-04-02 | Discharge: 2022-04-02 | Disposition: A | Discharge status: Home / Self Care | Payer: Medicare HMO | Attending: Emergency Medicine | Admitting: Emergency Medicine

## 2022-04-02 ENCOUNTER — Other Ambulatory Visit: Payer: Self-pay

## 2022-04-02 ENCOUNTER — Encounter: Payer: Self-pay | Admitting: Emergency Medicine

## 2022-04-02 ENCOUNTER — Emergency Department: Payer: Medicare HMO

## 2022-04-02 DIAGNOSIS — I10 Essential (primary) hypertension: Secondary | ICD-10-CM | POA: Insufficient documentation

## 2022-04-02 DIAGNOSIS — R0789 Other chest pain: Secondary | ICD-10-CM | POA: Insufficient documentation

## 2022-04-02 DIAGNOSIS — R079 Chest pain, unspecified: Secondary | ICD-10-CM

## 2022-04-02 LAB — BASIC METABOLIC PANEL
Anion gap: 8 (ref 5–15)
BUN: 14 mg/dL (ref 6–20)
CO2: 29 mmol/L (ref 22–32)
Calcium: 9.1 mg/dL (ref 8.9–10.3)
Chloride: 103 mmol/L (ref 98–111)
Creatinine, Ser: 1.01 mg/dL (ref 0.61–1.24)
GFR, Estimated: >60 mL/min (ref >60)
Glucose, Bld: 103 mg/dL — ABNORMAL HIGH (ref 70–99)
Potassium: 4 mmol/L (ref 3.5–5.1)
Sodium: 140 mmol/L (ref 135–145)

## 2022-04-02 LAB — CBC
HCT: 47.7 % (ref 39.0–52.0)
Hemoglobin: 16 g/dL (ref 13.0–17.0)
MCH: 29.9 pg (ref 26.0–34.0)
MCHC: 33.5 g/dL (ref 30.0–36.0)
MCV: 89 fL (ref 80.0–100.0)
Platelets: 266 10*3/uL (ref 150–400)
RBC: 5.36 MIL/uL (ref 4.22–5.81)
RDW: 12.6 % (ref 11.5–15.5)
WBC: 6.8 10*3/uL (ref 4.0–10.5)
nRBC: 0 % (ref 0.0–0.2)

## 2022-04-02 LAB — TROPONIN I (HIGH SENSITIVITY): Troponin I (High Sensitivity): 4 ng/L (ref <18)

## 2022-04-02 MED ORDER — ALUM & MAG HYDROXIDE-SIMETH 200-200-20 MG/5ML PO SUSP
30.0000 mL | Freq: Once | ORAL | Status: AC
  Administered 2022-04-02: 30 mL via ORAL
  Filled 2022-04-02: qty 30

## 2022-04-02 MED ORDER — FAMOTIDINE 20 MG PO TABS: 20.0000 mg | ORAL_TABLET | Freq: Two times a day (BID) | ORAL | 1 refills | Status: AC | PRN

## 2022-04-02 MED ORDER — SUCRALFATE 1 G PO TABS: 1.0000 g | ORAL_TABLET | Freq: Three times a day (TID) | ORAL | 11 refills | Status: AC

## 2022-04-02 MED ORDER — LIDOCAINE VISCOUS HCL 2 % MT SOLN
15.0000 mL | Freq: Once | OROMUCOSAL | Status: AC
  Administered 2022-04-02: 15 mL via ORAL
  Filled 2022-04-02: qty 15

## 2022-04-02 NOTE — ED Provider Notes (Signed)
[facility] Provider Note    Event Date/Time   First MD Initiated Contact with Patient 04/02/22 1538     (approximate)   History   Chief Complaint Chest Pain   HPI John [name] is a 52 y.o. male, history of Barrett esophagus, GERD, hypertension, anxiety, BPH, migraines, depression, presents to the emergency department for evaluation of chest pain.  Patient states that he has been experiencing indigestion, gas, belching for the past few days, as well as intermittent chest pressure.  Denies fever/chills, shortness of breath, abdominal pain, nausea/vomiting, diarrhea, urinary symptoms, headache, weakness, dizziness/lightheadedness, rashes, or paresthesias.  History Limitations: No limitations..        Physical Exam  Triage Vital Signs: ED Triage Vitals  Enc Vitals Group     BP 04/02/22 1500 (!) 146/99     Pulse Rate 04/02/22 1500 81     Resp 04/02/22 1500 18     Temp 04/02/22 1500 98.1 F (36.7 C)     Temp Source 04/02/22 1500 Oral     SpO2 04/02/22 1500 97 %     Weight 04/02/22 1458 215 lb (97.5 kg)     Height 04/02/22 1458 5' 8"$  (1.727 m)     Head Circumference --      Peak Flow --      Pain Score 04/02/22 1458 4     Pain Loc --      Pain Edu? --      Excl. in [place]? --     Most recent vital signs: Vitals:   04/02/22 1500  BP: (!) 146/99  Pulse: 81  Resp: 18  Temp: 98.1 F (36.7 C)  SpO2: 97%    General: Awake, NAD.  Skin: Warm, dry. No rashes or lesions.  Eyes: PERRL. Conjunctivae normal.  CV: Good peripheral perfusion.  S1 and S2 present.  No murmurs, rubs, or gallops.  No chest wall tenderness. Resp: Normal effort.  Lung sounds are clear bilaterally in the apices and bases. Abd: Soft, non-tender. No distention.  Neuro: At baseline. No gross neurological deficits.  Musculoskeletal: Normal ROM of all extremities.   Physical Exam    ED Results / Procedures / Treatments  Labs (all labs ordered are listed, but only  abnormal results are displayed) Labs Reviewed  BASIC METABOLIC PANEL - Abnormal; Notable for the following components:      Result Value   Glucose, Bld 103 (*)    All other components within normal limits  CBC  TROPONIN I (HIGH SENSITIVITY)     EKG Sinus rhythm, rate of 91, no ST segment changes, normal QRS, no QT prolongation.    RADIOLOGY  ED Provider Interpretation: I personally viewed and interpreted this x-ray, no evidence of acute cardiopulmonary abnormalities.  DG Chest 2 View  Result Date: 04/02/2022 CLINICAL DATA:  Chest pain and pressure EXAM: CHEST - 2 VIEW COMPARISON:  11/22/2020 FINDINGS: Cardiomediastinal silhouette and pulmonary vasculature are within normal limits. Lungs are clear. IMPRESSION: No acute cardiopulmonary process. Electronically Signed   By: Miachel Roux M.D.   On: 04/02/2022 15:16    PROCEDURES:  Critical Care performed: N/A.  Procedures    MEDICATIONS ORDERED IN ED: Medications  alum & mag hydroxide-simeth (MAALOX/MYLANTA) 200-200-20 MG/5ML suspension 30 mL (has no administration in time range)    And  lidocaine (XYLOCAINE) 2 % viscous mouth solution 15 mL (has no administration in time range)     IMPRESSION / MDM / ASSESSMENT AND PLAN / ED COURSE  I reviewed the triage vital signs and the nursing notes.                              Differential diagnosis includes, but is not limited to, GERD, gastritis, ACS, myocarditis, pericarditis, esophagitis, costochondritis, anxiety.  ED Course Patient appears well, vitals within normal limits.  NAD.  CBC shows leukocytosis, anemia, thrombocytopenia.  BMP shows no electrolyte abnormalities or AKI.  Initial troponin 4, consistent with previous values.  Assessment/Plan Patient presents with indigestion, belching, and gas x 3 days associated with intermittent chest pressure.  He appears well clinically.  Vitals within normal limits.  No remarkable findings on physical exam.  Chest x-ray was  reassuring.  EKG does not show any ischemic pathology or arrhythmias.  Troponins are reassuring as well.  Very low suspicion for any serious or life-threatening pathology.  Given his history and presentation, suspect likely GERD, which aligns well with what the patient was expecting.  He is currently taking Nexium daily.  Will treat him here with GI cocktail and prescribe him famotidine and sucralfate to help supplement his treatment.  Encouraged him to follow-up with his gastroenterologist if he is not experiencing relief with this.  He was amenable to this.  Will discharge.  Considered admission for this patient, but given his stable presentation and unremarkable workup, is unlikely benefit from admission.  Provided the patient with anticipatory guidance, return precautions, and educational material. Encouraged the patient to return to the emergency department at any time if they begin to experience any new or worsening symptoms. Patient expressed understanding and agreed with the plan.   Patient's presentation is most consistent with acute complicated illness / injury requiring diagnostic workup.       FINAL CLINICAL IMPRESSION(S) / ED DIAGNOSES   Final diagnoses:  Chest pain, unspecified type     Rx / DC Orders   ED Discharge Orders          Ordered    famotidine (PEPCID) 20 MG tablet  2 times daily PRN        04/02/22 1603    sucralfate (CARAFATE) 1 g tablet  3 times daily        04/02/22 1603             Note:  This document was prepared using Dragon voice recognition software and may include unintentional dictation errors.   Teodoro Spray, [place] 04/02/22 1614    Harvest Dark, MD 04/02/22 2244

## 2022-04-02 NOTE — ED Triage Notes (Signed)
Pt reports indigestion, gas and belching for a few days. Now pt has began having chest pressure across top of chest. Hx of HTN

## 2022-04-02 NOTE — Discharge Instructions (Addendum)
-  I suspect that your chest pain is likely related to gastroesophageal reflux disease.  You may review the educational material provided, particular the food choices.  Please continue taking your Nexium as previously prescribed.  If needed, you may also take the sucralfate and famotidine as prescribed as well.  -If your symptoms persist despite these treatments, please follow-up with the gastroenterologist listed on this page.  You may call them to schedule appointment.  -Return to the emergency department anytime if you begin to experience any new or worsening symptoms.

## 2022-04-06 DIAGNOSIS — I10 Essential (primary) hypertension: Secondary | ICD-10-CM | POA: Diagnosis not present

## 2022-04-06 DIAGNOSIS — Z0130 Encounter for examination of blood pressure without abnormal findings: Secondary | ICD-10-CM | POA: Diagnosis not present

## 2022-04-06 DIAGNOSIS — Z1331 Encounter for screening for depression: Secondary | ICD-10-CM | POA: Diagnosis not present

## 2022-04-06 DIAGNOSIS — E119 Type 2 diabetes mellitus without complications: Secondary | ICD-10-CM | POA: Diagnosis not present

## 2022-04-06 DIAGNOSIS — Z1389 Encounter for screening for other disorder: Secondary | ICD-10-CM | POA: Diagnosis not present

## 2022-04-06 DIAGNOSIS — K760 Fatty (change of) liver, not elsewhere classified: Secondary | ICD-10-CM | POA: Diagnosis not present

## 2022-04-06 DIAGNOSIS — R7303 Prediabetes: Secondary | ICD-10-CM | POA: Diagnosis not present

## 2022-04-06 DIAGNOSIS — G43809 Other migraine, not intractable, without status migrainosus: Secondary | ICD-10-CM | POA: Diagnosis not present

## 2022-04-08 DIAGNOSIS — G4733 Obstructive sleep apnea (adult) (pediatric): Secondary | ICD-10-CM | POA: Diagnosis not present

## 2022-04-08 DIAGNOSIS — I10 Essential (primary) hypertension: Secondary | ICD-10-CM | POA: Diagnosis not present

## 2022-04-27 DIAGNOSIS — G43009 Migraine without aura, not intractable, without status migrainosus: Secondary | ICD-10-CM | POA: Diagnosis not present

## 2022-04-27 DIAGNOSIS — R519 Headache, unspecified: Secondary | ICD-10-CM | POA: Diagnosis not present

## 2022-04-27 DIAGNOSIS — G43711 Chronic migraine without aura, intractable, with status migrainosus: Secondary | ICD-10-CM | POA: Diagnosis not present

## 2022-05-04 DIAGNOSIS — Z1389 Encounter for screening for other disorder: Secondary | ICD-10-CM | POA: Diagnosis not present

## 2022-05-04 DIAGNOSIS — G43809 Other migraine, not intractable, without status migrainosus: Secondary | ICD-10-CM | POA: Diagnosis not present

## 2022-05-04 DIAGNOSIS — M5412 Radiculopathy, cervical region: Secondary | ICD-10-CM | POA: Diagnosis not present

## 2022-05-04 DIAGNOSIS — Z0131 Encounter for examination of blood pressure with abnormal findings: Secondary | ICD-10-CM | POA: Diagnosis not present

## 2022-05-04 DIAGNOSIS — J329 Chronic sinusitis, unspecified: Secondary | ICD-10-CM | POA: Diagnosis not present

## 2022-05-14 DIAGNOSIS — R519 Headache, unspecified: Secondary | ICD-10-CM | POA: Diagnosis not present

## 2022-06-10 DIAGNOSIS — J329 Chronic sinusitis, unspecified: Secondary | ICD-10-CM | POA: Diagnosis not present

## 2022-06-10 DIAGNOSIS — H9190 Unspecified hearing loss, unspecified ear: Secondary | ICD-10-CM | POA: Diagnosis not present

## 2022-06-14 DIAGNOSIS — Z0100 Encounter for examination of eyes and vision without abnormal findings: Secondary | ICD-10-CM | POA: Diagnosis not present

## 2022-07-07 DIAGNOSIS — R002 Palpitations: Secondary | ICD-10-CM | POA: Diagnosis not present

## 2022-07-07 DIAGNOSIS — F419 Anxiety disorder, unspecified: Secondary | ICD-10-CM | POA: Diagnosis not present

## 2022-07-07 DIAGNOSIS — E785 Hyperlipidemia, unspecified: Secondary | ICD-10-CM | POA: Diagnosis not present

## 2022-07-07 DIAGNOSIS — I10 Essential (primary) hypertension: Secondary | ICD-10-CM | POA: Diagnosis not present

## 2022-07-07 DIAGNOSIS — I2510 Atherosclerotic heart disease of native coronary artery without angina pectoris: Secondary | ICD-10-CM | POA: Diagnosis not present

## 2022-07-07 DIAGNOSIS — R0602 Shortness of breath: Secondary | ICD-10-CM | POA: Diagnosis not present

## 2022-07-07 DIAGNOSIS — R0681 Apnea, not elsewhere classified: Secondary | ICD-10-CM | POA: Diagnosis not present

## 2022-07-07 DIAGNOSIS — G4733 Obstructive sleep apnea (adult) (pediatric): Secondary | ICD-10-CM | POA: Diagnosis not present

## 2022-07-21 DIAGNOSIS — Z0130 Encounter for examination of blood pressure without abnormal findings: Secondary | ICD-10-CM | POA: Diagnosis not present

## 2022-07-21 DIAGNOSIS — R609 Edema, unspecified: Secondary | ICD-10-CM | POA: Diagnosis not present

## 2022-07-21 DIAGNOSIS — Z1389 Encounter for screening for other disorder: Secondary | ICD-10-CM | POA: Diagnosis not present

## 2022-08-24 DIAGNOSIS — M792 Neuralgia and neuritis, unspecified: Secondary | ICD-10-CM | POA: Diagnosis not present

## 2022-08-24 DIAGNOSIS — R519 Headache, unspecified: Secondary | ICD-10-CM | POA: Diagnosis not present

## 2022-08-24 DIAGNOSIS — G43009 Migraine without aura, not intractable, without status migrainosus: Secondary | ICD-10-CM | POA: Diagnosis not present

## 2022-08-24 DIAGNOSIS — G43711 Chronic migraine without aura, intractable, with status migrainosus: Secondary | ICD-10-CM | POA: Diagnosis not present

## 2022-08-25 DIAGNOSIS — R609 Edema, unspecified: Secondary | ICD-10-CM | POA: Diagnosis not present

## 2022-08-25 DIAGNOSIS — Z1331 Encounter for screening for depression: Secondary | ICD-10-CM | POA: Diagnosis not present

## 2022-08-25 DIAGNOSIS — R29898 Other symptoms and signs involving the musculoskeletal system: Secondary | ICD-10-CM | POA: Diagnosis not present

## 2022-08-25 DIAGNOSIS — Z0000 Encounter for general adult medical examination without abnormal findings: Secondary | ICD-10-CM | POA: Diagnosis not present

## 2022-08-25 DIAGNOSIS — Z1389 Encounter for screening for other disorder: Secondary | ICD-10-CM | POA: Diagnosis not present

## 2022-08-25 DIAGNOSIS — M545 Low back pain, unspecified: Secondary | ICD-10-CM | POA: Diagnosis not present

## 2022-08-25 DIAGNOSIS — Z0130 Encounter for examination of blood pressure without abnormal findings: Secondary | ICD-10-CM | POA: Diagnosis not present

## 2022-08-25 DIAGNOSIS — F339 Major depressive disorder, recurrent, unspecified: Secondary | ICD-10-CM | POA: Diagnosis not present

## 2022-08-30 DIAGNOSIS — R1319 Other dysphagia: Secondary | ICD-10-CM | POA: Diagnosis not present

## 2022-08-30 DIAGNOSIS — Z9889 Other specified postprocedural states: Secondary | ICD-10-CM | POA: Diagnosis not present

## 2022-08-30 DIAGNOSIS — K2270 Barrett's esophagus without dysplasia: Secondary | ICD-10-CM | POA: Diagnosis not present

## 2022-08-30 DIAGNOSIS — K8020 Calculus of gallbladder without cholecystitis without obstruction: Secondary | ICD-10-CM | POA: Diagnosis not present

## 2022-08-30 DIAGNOSIS — R0789 Other chest pain: Secondary | ICD-10-CM | POA: Diagnosis not present

## 2022-08-30 DIAGNOSIS — D126 Benign neoplasm of colon, unspecified: Secondary | ICD-10-CM | POA: Diagnosis not present

## 2022-08-30 DIAGNOSIS — R109 Unspecified abdominal pain: Secondary | ICD-10-CM | POA: Diagnosis not present

## 2022-08-30 DIAGNOSIS — G4733 Obstructive sleep apnea (adult) (pediatric): Secondary | ICD-10-CM | POA: Diagnosis not present

## 2022-09-05 DIAGNOSIS — M5416 Radiculopathy, lumbar region: Secondary | ICD-10-CM | POA: Diagnosis not present

## 2022-09-08 DIAGNOSIS — M5416 Radiculopathy, lumbar region: Secondary | ICD-10-CM | POA: Diagnosis not present

## 2022-09-16 DIAGNOSIS — W57XXXA Bitten or stung by nonvenomous insect and other nonvenomous arthropods, initial encounter: Secondary | ICD-10-CM | POA: Diagnosis not present

## 2022-09-16 DIAGNOSIS — B880 Other acariasis: Secondary | ICD-10-CM | POA: Diagnosis not present

## 2022-09-23 DIAGNOSIS — M5416 Radiculopathy, lumbar region: Secondary | ICD-10-CM | POA: Diagnosis not present

## 2022-09-30 DIAGNOSIS — M5416 Radiculopathy, lumbar region: Secondary | ICD-10-CM | POA: Diagnosis not present

## 2022-10-04 DIAGNOSIS — M5416 Radiculopathy, lumbar region: Secondary | ICD-10-CM | POA: Diagnosis not present

## 2022-10-10 ENCOUNTER — Ambulatory Visit: Payer: Medicare HMO

## 2022-10-10 DIAGNOSIS — R1319 Other dysphagia: Secondary | ICD-10-CM | POA: Diagnosis not present

## 2022-10-10 DIAGNOSIS — D125 Benign neoplasm of sigmoid colon: Secondary | ICD-10-CM | POA: Diagnosis not present

## 2022-10-10 DIAGNOSIS — K2289 Other specified disease of esophagus: Secondary | ICD-10-CM | POA: Diagnosis not present

## 2022-10-10 DIAGNOSIS — K2270 Barrett's esophagus without dysplasia: Secondary | ICD-10-CM | POA: Diagnosis not present

## 2022-10-10 DIAGNOSIS — K5730 Diverticulosis of large intestine without perforation or abscess without bleeding: Secondary | ICD-10-CM | POA: Diagnosis not present

## 2022-10-10 DIAGNOSIS — D130 Benign neoplasm of esophagus: Secondary | ICD-10-CM | POA: Diagnosis not present

## 2022-10-11 ENCOUNTER — Telehealth: Payer: Self-pay

## 2022-10-11 ENCOUNTER — Other Ambulatory Visit: Payer: Self-pay

## 2022-10-11 NOTE — Telephone Encounter (Signed)
Patient was seen by [facility] on 08/30/2022 and you schedule him a appointment to see [name] for a follow up appointment. Patient transfer his care by going to [facility]. Please call patient to cancel his appointment for tomorrow with Dr. Allegra Lai 10/12/22

## 2022-10-12 ENCOUNTER — Ambulatory Visit: Payer: Medicare HMO | Admitting: Gastroenterology

## 2022-10-12 DIAGNOSIS — M5416 Radiculopathy, lumbar region: Secondary | ICD-10-CM | POA: Diagnosis not present

## 2022-10-18 DIAGNOSIS — M5416 Radiculopathy, lumbar region: Secondary | ICD-10-CM | POA: Diagnosis not present

## 2022-10-24 DIAGNOSIS — I10 Essential (primary) hypertension: Secondary | ICD-10-CM | POA: Diagnosis not present

## 2022-10-24 DIAGNOSIS — M5416 Radiculopathy, lumbar region: Secondary | ICD-10-CM | POA: Diagnosis not present

## 2022-10-24 DIAGNOSIS — G4733 Obstructive sleep apnea (adult) (pediatric): Secondary | ICD-10-CM | POA: Diagnosis not present

## 2022-10-26 DIAGNOSIS — M5416 Radiculopathy, lumbar region: Secondary | ICD-10-CM | POA: Diagnosis not present

## 2022-10-27 DIAGNOSIS — R609 Edema, unspecified: Secondary | ICD-10-CM | POA: Diagnosis not present

## 2022-10-27 DIAGNOSIS — Z0130 Encounter for examination of blood pressure without abnormal findings: Secondary | ICD-10-CM | POA: Diagnosis not present

## 2022-10-27 DIAGNOSIS — Z0131 Encounter for examination of blood pressure with abnormal findings: Secondary | ICD-10-CM | POA: Diagnosis not present

## 2022-10-27 DIAGNOSIS — Z1389 Encounter for screening for other disorder: Secondary | ICD-10-CM | POA: Diagnosis not present

## 2022-10-27 DIAGNOSIS — L738 Other specified follicular disorders: Secondary | ICD-10-CM | POA: Diagnosis not present

## 2022-11-09 DIAGNOSIS — M5416 Radiculopathy, lumbar region: Secondary | ICD-10-CM | POA: Diagnosis not present

## 2022-11-12 ENCOUNTER — Ambulatory Visit
Admission: EM | Admit: 2022-11-12 | Discharge: 2022-11-12 | Disposition: A | Discharge status: Home / Self Care | Payer: Medicare HMO | Attending: Physician Assistant | Admitting: Physician Assistant

## 2022-11-12 ENCOUNTER — Ambulatory Visit (INDEPENDENT_AMBULATORY_CARE_PROVIDER_SITE_OTHER): Payer: Medicare HMO

## 2022-11-12 DIAGNOSIS — S2231XA Fracture of one rib, right side, initial encounter for closed fracture: Secondary | ICD-10-CM

## 2022-11-12 DIAGNOSIS — S299XXA Unspecified injury of thorax, initial encounter: Secondary | ICD-10-CM | POA: Diagnosis not present

## 2022-11-12 DIAGNOSIS — M79605 Pain in left leg: Secondary | ICD-10-CM

## 2022-11-12 DIAGNOSIS — T07XXXA Unspecified multiple injuries, initial encounter: Secondary | ICD-10-CM

## 2022-11-12 DIAGNOSIS — S91312A Laceration without foreign body, left foot, initial encounter: Secondary | ICD-10-CM

## 2022-11-12 DIAGNOSIS — M7989 Other specified soft tissue disorders: Secondary | ICD-10-CM | POA: Diagnosis not present

## 2022-11-12 DIAGNOSIS — R0781 Pleurodynia: Secondary | ICD-10-CM | POA: Diagnosis not present

## 2022-11-12 MED ORDER — HYDROCODONE-ACETAMINOPHEN 5-325 MG PO TABS: 1.0000 | ORAL_TABLET | Freq: Four times a day (QID) | ORAL | 0 refills | Status: AC | PRN
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
## 2022-11-12 NOTE — Discharge Instructions (Signed)
-  You have a broken rib.  As discussed it will heal in a few weeks. - Ice the area.  Make sure you are taking frequent deep breaths so you do not end up with pneumonia but if you do develop fever, cough, shortness of breath need to be seen immediately. - The laceration of your foot has been sutured with 7 sutures.  Return in about a week to have the sutures removed.  Do not get the area wet for 48 hours then after that clean with soap and water.  If you notice increased redness, swelling, increased pain, pustular drainage return for evaluation of possible infection. - Ice and elevate your lower extremity.  I sent something for pain if you absolutely need it.

## 2022-11-13 DIAGNOSIS — S2231XA Fracture of one rib, right side, initial encounter for closed fracture: Secondary | ICD-10-CM | POA: Diagnosis not present

## 2022-11-13 DIAGNOSIS — S91312A Laceration without foreign body, left foot, initial encounter: Secondary | ICD-10-CM

## 2022-11-13 DIAGNOSIS — M79605 Pain in left leg: Secondary | ICD-10-CM | POA: Diagnosis not present

## 2022-11-13 DIAGNOSIS — T07XXXA Unspecified multiple injuries, initial encounter: Secondary | ICD-10-CM | POA: Diagnosis not present

## 2022-11-15 ENCOUNTER — Ambulatory Visit (INDEPENDENT_AMBULATORY_CARE_PROVIDER_SITE_OTHER): Payer: Medicare HMO

## 2022-11-15 ENCOUNTER — Ambulatory Visit
Admission: EM | Admit: 2022-11-15 | Discharge: 2022-11-15 | Disposition: A | Discharge status: Home / Self Care | Payer: Medicare HMO | Attending: Emergency Medicine | Admitting: Emergency Medicine

## 2022-11-15 DIAGNOSIS — S2231XA Fracture of one rib, right side, initial encounter for closed fracture: Secondary | ICD-10-CM | POA: Diagnosis not present

## 2022-11-15 DIAGNOSIS — R0781 Pleurodynia: Secondary | ICD-10-CM

## 2022-11-15 NOTE — Discharge Instructions (Addendum)
Your x-rays did not show any evidence of rib fracture where you are having pain.  It is entirely possible that the pain is coming from your previously known rib fracture, or from the muscles between your ribs.  Continue to use over-the-counter Tylenol and/or ibuprofen according to the package instructions as needed for mild to moderate pain.  You may also apply topical, over-the-counter, lidocaine patches to your chest wall.  1 patch is good for 8 hours.  If your pain continues I recommend that you follow-up with your PCP.

## 2022-11-15 NOTE — ED Triage Notes (Signed)
Patient fell sat and is having right rib pain.
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
## 2022-11-21 ENCOUNTER — Ambulatory Visit
Admission: EM | Admit: 2022-11-21 | Discharge: 2022-11-21 | Disposition: A | Discharge status: Home / Self Care | Payer: Medicare HMO | Attending: Physician Assistant | Admitting: Physician Assistant

## 2022-11-21 ENCOUNTER — Other Ambulatory Visit: Payer: Self-pay

## 2022-11-21 DIAGNOSIS — L03116 Cellulitis of left lower limb: Secondary | ICD-10-CM | POA: Diagnosis not present

## 2022-11-21 DIAGNOSIS — S93402D Sprain of unspecified ligament of left ankle, subsequent encounter: Secondary | ICD-10-CM | POA: Diagnosis not present

## 2022-11-21 DIAGNOSIS — Z4802 Encounter for removal of sutures: Secondary | ICD-10-CM

## 2022-11-21 DIAGNOSIS — S9032XA Contusion of left foot, initial encounter: Secondary | ICD-10-CM

## 2022-11-21 DIAGNOSIS — S93402A Sprain of unspecified ligament of left ankle, initial encounter: Secondary | ICD-10-CM | POA: Diagnosis not present

## 2022-11-21 MED ORDER — CEPHALEXIN 500 MG PO CAPS: 500.0000 mg | ORAL_CAPSULE | Freq: Three times a day (TID) | ORAL | 0 refills | Status: AC

## 2022-11-21 NOTE — ED Provider Notes (Signed)
[facility]    CSN: [id number] Arrival date & time: 11/21/22  1528      History   Chief Complaint Chief Complaint  Patient presents with   Suture / Staple Removal    HPI John [name] is a 52 y.o. male returning for removal of sutures of the left foot.  Patient seen by myself on 11/12/2022 for laceration of left foot.  7 sutures placed.  Patient says he has been keeping the wound covered.  It is a little red around the laceration site but he is unsure if that is new.  He says that he was going to come tomorrow to have the sutures removed but noticed some bruising around his toes and the lateral foot.  Denies any injuries.  States he did walk around quite a bit yesterday.  Reports stiffness and swelling of the ankle for the past week and a half since the injury.  Reports continued discomfort of the right rib.  History of rib fracture 9 days ago.  Has been trying to stretch the foot and ankle.  No other new complaints.  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   Symptomatic cholelithiasis 05/02/2016    Patient Active Problem List   Diagnosis Date Noted   History of adenomatous polyp of colon    Abnormal liver function tests 08/02/2021   Anxiety state 08/02/2021   History of hypertension 08/03/2020   Headache disorder 08/03/2020   S/P laparoscopic procedure    Polyp of colon    Lap Nissen over 56 Fr with single suture closure of diaphragm 04/05/2013   Right inguinal hernia 04/05/2013   GERD (gastroesophageal  reflux disease) 01/25/2013   Barrett esophagus 08/30/2012   OSA (obstructive sleep apnea) 08/30/2012    Past Surgical History:  Procedure Laterality Date   APPENDECTOMY  1999   CHOLECYSTECTOMY  05/02/2016   LAPROSCOPIC   CHOLECYSTECTOMY N/A 05/02/2016   Procedure: LAPAROSCOPIC CHOLECYSTECTOMY WITH INTRAOPERATIVE CHOLANGIOGRAM;  Surgeon: Rodman Pickle, MD;  Location: [facility];  Service: General;  Laterality: N/A;   COLONOSCOPY WITH PROPOFOL N/A 05/15/2020   Procedure: COLONOSCOPY WITH PROPOFOL;  Surgeon: Pasty Spillers, MD;  Location: [facility];  Service: Endoscopy;  Laterality: N/A;   COLONOSCOPY WITH PROPOFOL N/A 08/10/2021   Procedure: COLONOSCOPY WITH PROPOFOL;  Surgeon: Toney Reil, MD;  Location: [facility];  Service: Endoscopy;  Laterality: N/A;  sleep apnea   ESOPHAGEAL MANOMETRY N/A 11/12/2012   Procedure: ESOPHAGEAL MANOMETRY (EM);  Surgeon: Theda Belfast, MD;  Location: [facility];  Service: Endoscopy;  Laterality: N/A;   ESOPHAGOGASTRODUODENOSCOPY (EGD) WITH PROPOFOL N/A 05/15/2020   Procedure: ESOPHAGOGASTRODUODENOSCOPY (EGD) WITH PROPOFOL;  Surgeon: Pasty Spillers, MD;  Location: [facility];  Service: Endoscopy;  Laterality: N/A;   KNEE ARTHROSCOPY Right 04/18/2014   Procedure: RIGHT ARTHROSCOPY KNEE / PLICA EXCISION;  Surgeon: Javier Docker, MD;  Location: [facility];  Service: Orthopedics;  Laterality: Right;   LAPAROSCOPIC NISSEN FUNDOPLICATION N/A 04/05/2013   Procedure: LAPAROSCOPIC NISSEN FUNDOPLICATION;  Surgeon: Valarie Merino, MD;  Location: [facility];  Service: General;  Laterality: N/A;   NASAL SEPTUM SURGERY  2002   UPPER GI ENDOSCOPY  2013       Home Medications    Prior to Admission medications   Medication Sig Start Date End Date Taking? Authorizing Provider  cephALEXin (KEFLEX) 500 MG capsule Take 1 capsule (500 mg total) by mouth 3 (three) times daily for 7 days. 11/21/22 11/28/22 Yes Eusebio Friendly B,  PA-C  amLODipine (NORVASC) 5 MG tablet Take 5 mg by mouth daily. 03/10/20   [provider]  baclofen (LIORESAL) 10 MG tablet Take by mouth.    [provider]  Bromelains 500 MG TABS Take by mouth. 07/22/16   [provider]  buPROPion (WELLBUTRIN XL) 300 MG 24 hr tablet Take 300 mg by mouth daily. 06/15/21   [provider]  cetirizine (ZYRTEC) 10 MG tablet Take 1 tablet by mouth daily. 02/07/20   [provider]  Diclofenac Potassium,Migraine, 50 MG PACK Take 50 mg by mouth 2 (two) times daily as needed. Migraines    [provider]  Erenumab-aooe (AIMOVIG) 70 MG/ML SOAJ Inject into the skin. 10/04/16   [provider]  esomeprazole (NEXIUM) 40 MG capsule Take 40 mg by mouth daily before breakfast.    [provider]  famotidine (PEPCID) 20 MG tablet Take 1 tablet (20 mg total) by mouth 2 (two) times daily as needed for heartburn or indigestion. 04/02/22 04/02/23  Varney Daily, PA  fluticasone (FLONASE) 50 MCG/ACT nasal spray Place 2 sprays into both nostrils 2 (two) times daily.    [provider]  furosemide (LASIX) 20 MG tablet Take 20 mg by mouth daily as needed. 07/21/22   [provider]  hydrOXYzine (ATARAX) 10 MG tablet Take 10 mg by mouth at bedtime as needed. 06/03/21   [provider]  ketoconazole (NIZORAL) 2 % shampoo Apply topically daily. 07/13/21   [provider]  lisinopril (ZESTRIL) 5 MG tablet Take 1 tablet by mouth daily. 03/05/21   [provider]  magnesium oxide (MAG-OX) 400 MG tablet Take 2 tablets by mouth daily. 04/19/21   [provider]  MELATONIN PO Take 10 mg by mouth daily.    [provider]  meloxicam (MOBIC) 15 MG tablet Take 15 mg by mouth daily. 07/12/21   [provider]  pregabalin (LYRICA) 150 MG capsule Take 150 mg by mouth 2 (two) times daily. 09/30/22   [provider]  rizatriptan (MAXALT) 10 MG tablet Take 10  mg by mouth as needed for migraine.    [provider]  sucralfate (CARAFATE) 1 g tablet Take 1 tablet (1 g total) by mouth 3 (three) times daily. 04/02/22 04/02/23  Varney Daily, PA  tiZANidine (ZANAFLEX) 4 MG tablet Take 4 mg by mouth 3 (three) times daily. 07/08/21   [provider]  triamcinolone cream (KENALOG) 0.1 % Apply 1 Application topically 2 (two) times daily. 09/16/22   [provider]  venlafaxine XR (EFFEXOR-XR) 75 MG 24 hr capsule Take 75 mg by mouth daily. 01/10/22   [provider]    Family History Family History  Problem Relation Age of Onset   Cancer Father        skin   Diabetes Father    Cancer Mother  pancreatic 2010   Hypertension Mother    Cancer Paternal Aunt        breast    Social History Social History   Tobacco Use   Smoking status: Never   Smokeless tobacco: Never  Vaping Use   Vaping status: Never Used  Substance Use Topics   Alcohol use: No   Drug use: No     Allergies   Doxycycline and Nitroglycerin   Review of Systems Review of Systems  Constitutional:  Negative for fever.  Musculoskeletal:  Positive for arthralgias and joint swelling. Negative for gait problem.  Skin:  Positive for color change and wound.  Neurological:  Negative for weakness and numbness.     Physical Exam Triage Vital Signs ED Triage Vitals  Encounter Vitals Group     BP 11/21/22 1552 (!) 148/98     Systolic BP Percentile --      Diastolic BP Percentile --      Pulse Rate 11/21/22 1552 96     Resp 11/21/22 1552 20     Temp 11/21/22 1552 98.4 F (36.9 C)     Temp src --      SpO2 11/21/22 1552 100 %     Weight --      Height --      Head Circumference --      Peak Flow --      Pain Score 11/21/22 1551 2     Pain Loc --      Pain Education --      Exclude from Growth Chart --    No data found.  Updated Vital Signs BP (!) 148/98   Pulse 96   Temp 98.4 F (36.9 C)   Resp 20   SpO2 100%       Physical Exam Vitals and nursing note reviewed.  Constitutional:      General: He is not in acute distress.    Appearance: Normal appearance. He is well-developed. He is not ill-appearing.  HENT:     Head: Normocephalic and atraumatic.  Eyes:     General: No scleral icterus.    Conjunctiva/sclera: Conjunctivae normal.  Cardiovascular:     Rate and Rhythm: Normal rate and regular rhythm.  Pulmonary:     Effort: Pulmonary effort is normal. No respiratory distress.     Breath sounds: Normal breath sounds.  Musculoskeletal:     Cervical back: Neck supple.     Comments: Faint contusions bases of second, third and fourth toes of left foot. Faint contusion left lateral foot. TTP lateral left ankle. Stiffness of ankle noted. No calf swelling or TTP  Skin:    General: Skin is warm and dry.     Capillary Refill: Capillary refill takes less than 2 seconds.     Findings: Bruising and erythema present.     Comments: 3 cm laceration left dorsal foot/ankle with surrounding mild erythema. No apparent TTP.  Neurological:     General: No focal deficit present.     Mental Status: He is alert. Mental status is at baseline.     Motor: No weakness.     Gait: Gait normal.  Psychiatric:        Mood and Affect: Mood normal.      UC Treatments / Results  Labs (all labs ordered are listed, but only abnormal results are displayed) Labs Reviewed - No data to display  EKG   Radiology No results found.  Procedures Procedures (including critical care time)  Suture  removal: Removed 7 sutures from left foot/ankle.  Cleaned with alcohol swab and covered with Band-Aid.  Medications Ordered in UC Medications - No data to display  Initial Impression / Assessment and Plan / UC Course  I have reviewed the triage vital signs and the nursing notes.  Pertinent labs & imaging results that were available during my care of the patient were reviewed by me and considered in my medical decision making  (see chart for details).   52 year old male returns for suture removal of left foot/ankle.  Sutures placed 9 days ago.  Also reporting some bruising of left foot and ankle over the past day with no new injury.  Sutures x 7 were removed.  Wound is open just a little after sutures were removed.  There is some surrounding erythema so we will cover him with Keflex for possible developing cellulitis.  Discussed wound care with him.  Mild contusions of foot.  Unsure as the cause but he has no significant swelling and no real pain.  No calf pain or tenderness.  Most could be related to walking more than usual yesterday.  Patient given ankle brace for ongoing ankle discomfort and stiffness.  Reviewed RICE guidelines and use of anti-inflammatory medication.  Reviewed return precautions.   Final Clinical Impressions(s) / UC Diagnoses   Final diagnoses:  Encounter for removal of sutures  Cellulitis of left foot  Sprain of left ankle, unspecified ligament, subsequent encounter  Contusion of left foot, initial encounter     Discharge Instructions      -Sutures were taken out.  There is still a little opening of the wound.  I am also concerned about possible mild infection so sent antibiotic.  Clean the area with soap and water and keep a Band-Aid on until it is fully closed. - I am not too concerned about the bruising of the foot but if you have increased bruising, increased swelling, pain in your calf or worsening the condition you should be seen again. - Elevate and ice your foot and ankle. - I provided you with a brace to wear for comfort.  Take your foot out and perform range of motion exercises to help with stiffness. - Consider taking ibuprofen for inflammation. - If you are still having discomfort of the left foot/ankle after the next 1 to 2 weeks follow-up with orthopedics.    ED Prescriptions     Medication Sig Dispense Auth. Provider   cephALEXin (KEFLEX) 500 MG capsule Take 1 capsule  (500 mg total) by mouth 3 (three) times daily for 7 days. 21 capsule Shirlee Latch, PA-C      PDMP not reviewed this encounter.   Shirlee Latch, PA-C 11/21/22 [phone]

## 2022-11-21 NOTE — ED Triage Notes (Addendum)
Pt here for stitches to be romoved. Wound occurred 9 days ago but today pt noticed foor swelling and bruising. Positive swelling and bruising of left foot with > 3 sec cap refill to foot, able to wiggle toes and warm to touch. Pt c/o pain when standing. Wound healing well. No drainage, redness or swelling at site.

## 2022-11-21 NOTE — Discharge Instructions (Addendum)
-  Sutures were taken out.  There is still a little opening of the wound.  I am also concerned about possible mild infection so sent antibiotic.  Clean the area with soap and water and keep a Band-Aid on until it is fully closed. - I am not too concerned about the bruising of the foot but if you have increased bruising, increased swelling, pain in your calf or worsening the condition you should be seen again. - Elevate and ice your foot and ankle. - I provided you with a brace to wear for comfort.  Take your foot out and perform range of motion exercises to help with stiffness. - Consider taking ibuprofen for inflammation. - If you are still having discomfort of the left foot/ankle after the next 1 to 2 weeks follow-up with orthopedics.

## 2022-11-23 DIAGNOSIS — G4733 Obstructive sleep apnea (adult) (pediatric): Secondary | ICD-10-CM | POA: Diagnosis not present

## 2022-11-23 DIAGNOSIS — I10 Essential (primary) hypertension: Secondary | ICD-10-CM | POA: Diagnosis not present

## 2022-12-15 ENCOUNTER — Other Ambulatory Visit: Payer: Self-pay

## 2022-12-15 ENCOUNTER — Emergency Department
Admission: EM | Admit: 2022-12-15 | Discharge: 2022-12-15 | Disposition: A | Discharge status: Home / Self Care | Payer: Medicare HMO | Attending: Emergency Medicine | Admitting: Emergency Medicine

## 2022-12-15 ENCOUNTER — Encounter: Payer: Self-pay | Admitting: Medical Oncology

## 2022-12-15 DIAGNOSIS — R630 Anorexia: Secondary | ICD-10-CM | POA: Diagnosis not present

## 2022-12-15 DIAGNOSIS — Z20822 Contact with and (suspected) exposure to covid-19: Secondary | ICD-10-CM | POA: Insufficient documentation

## 2022-12-15 DIAGNOSIS — I10 Essential (primary) hypertension: Secondary | ICD-10-CM | POA: Diagnosis not present

## 2022-12-15 DIAGNOSIS — R110 Nausea: Secondary | ICD-10-CM | POA: Insufficient documentation

## 2022-12-15 DIAGNOSIS — R0789 Other chest pain: Secondary | ICD-10-CM | POA: Diagnosis not present

## 2022-12-15 DIAGNOSIS — R112 Nausea with vomiting, unspecified: Secondary | ICD-10-CM | POA: Diagnosis not present

## 2022-12-15 DIAGNOSIS — R1013 Epigastric pain: Secondary | ICD-10-CM | POA: Diagnosis not present

## 2022-12-15 DIAGNOSIS — R197 Diarrhea, unspecified: Secondary | ICD-10-CM | POA: Insufficient documentation

## 2022-12-15 LAB — CBC
HCT: 49.1 % (ref 39.0–52.0)
Hemoglobin: 16.9 g/dL (ref 13.0–17.0)
MCH: 29.4 pg (ref 26.0–34.0)
MCHC: 34.4 g/dL (ref 30.0–36.0)
MCV: 85.4 fL (ref 80.0–100.0)
Platelets: 303 10*3/uL (ref 150–400)
RBC: 5.75 MIL/uL (ref 4.22–5.81)
RDW: 12.7 % (ref 11.5–15.5)
WBC: 7.1 10*3/uL (ref 4.0–10.5)
nRBC: 0 % (ref 0.0–0.2)

## 2022-12-15 LAB — URINALYSIS, ROUTINE W REFLEX MICROSCOPIC
Bacteria, UA: NONE SEEN
Bilirubin Urine: NEGATIVE
Glucose, UA: NEGATIVE mg/dL
Ketones, ur: 5 mg/dL — AB
Leukocytes,Ua: NEGATIVE
Nitrite: NEGATIVE
Protein, ur: NEGATIVE mg/dL
Specific Gravity, Urine: 1.021 (ref 1.005–1.030)
pH: 5 (ref 5.0–8.0)

## 2022-12-15 LAB — COMPREHENSIVE METABOLIC PANEL
ALT: 38 U/L (ref 0–44)
AST: 28 U/L (ref 15–41)
Albumin: 4.5 g/dL (ref 3.5–5.0)
Alkaline Phosphatase: 124 U/L (ref 38–126)
Anion gap: 9 (ref 5–15)
BUN: 14 mg/dL (ref 6–20)
CO2: 25 mmol/L (ref 22–32)
Calcium: 9.1 mg/dL (ref 8.9–10.3)
Chloride: 102 mmol/L (ref 98–111)
Creatinine, Ser: 0.97 mg/dL (ref 0.61–1.24)
GFR, Estimated: >60 mL/min (ref >60)
Glucose, Bld: 100 mg/dL — ABNORMAL HIGH (ref 70–99)
Potassium: 3.6 mmol/L (ref 3.5–5.1)
Sodium: 136 mmol/L (ref 135–145)
Total Bilirubin: 1 mg/dL (ref 0.3–1.2)
Total Protein: 7.5 g/dL (ref 6.5–8.1)

## 2022-12-15 LAB — SARS CORONAVIRUS 2 BY RT PCR: SARS Coronavirus 2 by RT PCR: NEGATIVE

## 2022-12-15 LAB — TROPONIN I (HIGH SENSITIVITY): Troponin I (High Sensitivity): 3 ng/L (ref <18)

## 2022-12-15 LAB — LIPASE, BLOOD: Lipase: 35 U/L (ref 11–51)

## 2022-12-15 MED ORDER — ONDANSETRON 4 MG PO TBDP: 4.0000 mg | ORAL_TABLET | Freq: Three times a day (TID) | ORAL | 0 refills | Status: AC | PRN

## 2022-12-15 NOTE — ED Provider Notes (Signed)
[facility] Provider Note    Event Date/Time   First MD Initiated Contact with Patient 12/15/22 1203     (approximate)  History   Chief Complaint: Abdominal Pain and Diarrhea  HPI  John [name] is a 52 y.o. male with a past medical history of gastric reflux, hypertension, anxiety, presents to the emergency department for nausea upper abdominal discomfort, left chest discomfort and diarrhea for the past 1.5 weeks.  According to the patient he has been very "stressed" recently has not been eating or drinking as much.  States he has been nauseated at times has been experiencing diarrhea at times.  Patient also states several weeks ago he fell and fractured a rib.  He states he thinks his pain could be related to that but he was not sure so he came to the emergency department for evaluation.  No fever.  No cough or congestion.  Physical Exam   Triage Vital Signs: ED Triage Vitals  Encounter Vitals Group     BP 12/15/22 1047 (!) 146/96     Systolic BP Percentile --      Diastolic BP Percentile --      Pulse Rate 12/15/22 1047 (!) 107     Resp 12/15/22 1047 18     Temp 12/15/22 1047 97.8 F (36.6 C)     Temp Source 12/15/22 1047 Oral     SpO2 12/15/22 1047 95 %     Weight 12/15/22 1048 213 lb (96.6 kg)     Height 12/15/22 1048 5\' 8"  (1.727 m)     Head Circumference --      Peak Flow --      Pain Score 12/15/22 1048 7     Pain Loc --      Pain Education --      Exclude from Growth Chart --     Most recent vital signs: Vitals:   12/15/22 1047  BP: (!) 146/96  Pulse: (!) 107  Resp: 18  Temp: 97.8 F (36.6 C)  SpO2: 95%    General: Awake, no distress.  CV:  Good peripheral perfusion.  Regular rate and rhythm  Resp:  Normal effort.  Equal breath sounds bilaterally.  Abd:  No distention.  Soft, mild epigastric tenderness.  No rebound or guarding.   ED Results / Procedures / Treatments   EKG  EKG viewed and interpreted by myself shows a  normal sinus rhythm at 100 bpm with a narrow QRS, normal axis, normal intervals, no concerning ST changes.  MEDICATIONS ORDERED IN ED: Medications - No data to display   IMPRESSION / MDM / ASSESSMENT AND PLAN / ED COURSE  I reviewed the triage vital signs and the nursing notes.  Patient's presentation is most consistent with acute presentation with potential threat to life or bodily function.  Patient presents the emergency department for nausea decreased appetite, increased stress, diarrhea.  Overall the patient appears well, reassuring vital signs, reassuring labs including a normal CBC with a normal white blood cell count reassuring chemistry including normal LFTs normal kidney function and a normal lipase.  Patient's COVID test is pending.  We will check a urine sample and treat with ODT Zofran.  Given the patient's complaint of some chest discomfort although he believes is related to his fall several weeks ago which has already been evaluated per patient we will obtain a troponin as a precaution.  Patient agreeable to plan.  There is no focal tenderness on exam  Patient's workup is reassuring, COVID test is negative, urinalysis is normal.  Given the patient's reassuring workup I believe the patient is safe for discharge home with outpatient follow-up.  Will discharge with ODT Zofran.  FINAL CLINICAL IMPRESSION(S) / ED DIAGNOSES   Nausea Decreased appetite Abdominal pain   Note:  This document was prepared using Dragon voice recognition software and may include unintentional dictation errors.   Minna Antis, MD 12/15/22 1350

## 2022-12-15 NOTE — ED Triage Notes (Addendum)
Pt reports that he has been having abd pain with diarrhea and nausea x 1.5 weeks. Reports that he has been losing weight. Also reports some left sided chest discomfort.

## 2022-12-24 DIAGNOSIS — I10 Essential (primary) hypertension: Secondary | ICD-10-CM | POA: Diagnosis not present

## 2022-12-24 DIAGNOSIS — G4733 Obstructive sleep apnea (adult) (pediatric): Secondary | ICD-10-CM | POA: Diagnosis not present

## 2023-02-13 DIAGNOSIS — M1990 Unspecified osteoarthritis, unspecified site: Secondary | ICD-10-CM | POA: Diagnosis not present

## 2023-02-13 DIAGNOSIS — I4891 Unspecified atrial fibrillation: Secondary | ICD-10-CM | POA: Diagnosis not present

## 2023-02-13 DIAGNOSIS — L409 Psoriasis, unspecified: Secondary | ICD-10-CM | POA: Diagnosis not present

## 2023-02-13 DIAGNOSIS — I10 Essential (primary) hypertension: Secondary | ICD-10-CM | POA: Diagnosis not present

## 2023-02-13 DIAGNOSIS — K219 Gastro-esophageal reflux disease without esophagitis: Secondary | ICD-10-CM | POA: Diagnosis not present

## 2023-02-13 DIAGNOSIS — G43909 Migraine, unspecified, not intractable, without status migrainosus: Secondary | ICD-10-CM | POA: Diagnosis not present

## 2023-02-13 DIAGNOSIS — Z79899 Other long term (current) drug therapy: Secondary | ICD-10-CM | POA: Diagnosis not present

## 2023-02-13 DIAGNOSIS — G4733 Obstructive sleep apnea (adult) (pediatric): Secondary | ICD-10-CM | POA: Diagnosis not present

## 2023-02-13 DIAGNOSIS — F419 Anxiety disorder, unspecified: Secondary | ICD-10-CM | POA: Diagnosis not present

## 2023-02-13 DIAGNOSIS — E669 Obesity, unspecified: Secondary | ICD-10-CM | POA: Diagnosis not present

## 2023-02-13 DIAGNOSIS — K2270 Barrett's esophagus without dysplasia: Secondary | ICD-10-CM | POA: Diagnosis not present

## 2023-02-13 DIAGNOSIS — M62838 Other muscle spasm: Secondary | ICD-10-CM | POA: Diagnosis not present

## 2023-02-28 ENCOUNTER — Ambulatory Visit
Admission: EM | Admit: 2023-02-28 | Discharge: 2023-02-28 | Disposition: A | Discharge status: Home / Self Care | Payer: Medicare HMO | Attending: Emergency Medicine | Admitting: Emergency Medicine

## 2023-02-28 ENCOUNTER — Encounter: Payer: Self-pay | Admitting: Emergency Medicine

## 2023-02-28 ENCOUNTER — Ambulatory Visit (INDEPENDENT_AMBULATORY_CARE_PROVIDER_SITE_OTHER): Payer: Medicare HMO

## 2023-02-28 DIAGNOSIS — M79672 Pain in left foot: Secondary | ICD-10-CM | POA: Diagnosis not present

## 2023-02-28 DIAGNOSIS — M25572 Pain in left ankle and joints of left foot: Secondary | ICD-10-CM | POA: Diagnosis not present

## 2023-02-28 DIAGNOSIS — S96912A Strain of unspecified muscle and tendon at ankle and foot level, left foot, initial encounter: Secondary | ICD-10-CM

## 2023-02-28 DIAGNOSIS — X503XXA Overexertion from repetitive movements, initial encounter: Secondary | ICD-10-CM

## 2023-02-28 MED ORDER — DICLOFENAC SODIUM 1 % EX GEL: 2.0000 g | Freq: Four times a day (QID) | CUTANEOUS | 0 refills | Status: AC

## 2023-02-28 NOTE — Discharge Instructions (Signed)
 Apply the Voltaren gel.  I would try the ASO as well.  Ice your foot at the end of the day.  X-ray negative for fracture, dislocation

## 2023-02-28 NOTE — ED Triage Notes (Signed)
 Patient states he injured his left foot four months ago. Patient states the pain has increased over the last 5 weeks.

## 2023-02-28 NOTE — ED Provider Notes (Signed)
 HPI  SUBJECTIVE:  John [name] is a 53 y.o. male who presents with 4 to 5 weeks of dull, achy, throbbing, intermittent, hours long left ankle pain that radiates to the dorsum of his foot where he sustained a laceration on his ankle on 9/28.  He states that he had pain for 4 to 6 weeks after having the laceration repaired, but then it resolved.  It returned 4 to 5 weeks ago at the scar site.  It is worse with standing, activity.  No repeat trauma, swelling, fevers, erythema.  He tried rest which helps sometimes.  He is afraid to try medications due to chronic headaches.  Symptoms are worse at the end of the day after prolonged periods of standing and with activity.  He is moving this week and has been on his feet a lot more recently.  He has a past medical history of GERD, hypertension, intractable, daily cluster headaches.  PCP: None.   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   Symptomatic cholelithiasis 05/02/2016    Past Surgical History:  Procedure Laterality Date   APPENDECTOMY  1999   CHOLECYSTECTOMY  05/02/2016   LAPROSCOPIC   CHOLECYSTECTOMY N/A 05/02/2016   Procedure: LAPAROSCOPIC CHOLECYSTECTOMY WITH INTRAOPERATIVE CHOLANGIOGRAM;  Surgeon: Herlene Beverley Bureau, MD;  Location: [facility] OR;  Service: General;  Laterality: N/A;   COLONOSCOPY WITH PROPOFOL  N/A 05/15/2020   Procedure: COLONOSCOPY WITH PROPOFOL ;  Surgeon: Janalyn Keene NOVAK, MD;  Location: [facility];  Service: Endoscopy;  Laterality: N/A;   COLONOSCOPY WITH PROPOFOL   N/A 08/10/2021   Procedure: COLONOSCOPY WITH PROPOFOL ;  Surgeon: Unk Corinn Skiff, MD;  Location: [facility];  Service: Endoscopy;  Laterality: N/A;  sleep apnea   ESOPHAGEAL MANOMETRY N/A 11/12/2012   Procedure: ESOPHAGEAL MANOMETRY (EM);  Surgeon: Belvie JONETTA Just, MD;  Location: [facility];  Service: Endoscopy;  Laterality: N/A;   ESOPHAGOGASTRODUODENOSCOPY (EGD) WITH PROPOFOL  N/A 05/15/2020   Procedure: ESOPHAGOGASTRODUODENOSCOPY (EGD) WITH PROPOFOL ;  Surgeon: Janalyn Keene NOVAK, MD;  Location: [facility];  Service: Endoscopy;  Laterality: N/A;   KNEE ARTHROSCOPY Right 04/18/2014   Procedure: RIGHT ARTHROSCOPY KNEE / PLICA EXCISION;  Surgeon: Reyes JAYSON Billing, MD;  Location: [facility];  Service: Orthopedics;  Laterality: Right;   LAPAROSCOPIC NISSEN FUNDOPLICATION N/A 04/05/2013   Procedure: LAPAROSCOPIC NISSEN FUNDOPLICATION;  Surgeon: Donnice NOVAK Lunger, MD;  Location: [facility];  Service: General;  Laterality: N/A;   NASAL SEPTUM SURGERY  2002   UPPER GI ENDOSCOPY  2013    Family History  Problem Relation Age of Onset   Cancer Father        skin   Diabetes Father    Cancer Mother        pancreatic 2010   Hypertension Mother    Cancer Paternal Aunt        breast    Social History   Tobacco Use   Smoking status:  Never   Smokeless tobacco: Never  Vaping Use   Vaping status: Never Used  Substance Use Topics   Alcohol use: No   Drug use: No    No current facility-administered medications for this encounter.  Current Outpatient Medications:    amLODipine (NORVASC) 5 MG tablet, Take 5 mg by mouth daily., Disp: , Rfl:    baclofen (LIORESAL) 10 MG tablet, Take by mouth., Disp: , Rfl:    esomeprazole (NEXIUM) 40 MG capsule, Take 40 mg by mouth daily before breakfast., Disp: , Rfl:    fluticasone (FLONASE) 50 MCG/ACT nasal spray, Place 2 sprays into both nostrils 2 (two) times daily., Disp: , Rfl:    hydrOXYzine  (ATARAX ) 10 MG tablet, Take 10 mg by mouth  at bedtime as needed., Disp: , Rfl:    ketoconazole (NIZORAL) 2 % shampoo, Apply topically daily., Disp: , Rfl:    lisinopril  (ZESTRIL ) 5 MG tablet, Take 1 tablet by mouth daily., Disp: , Rfl:    magnesium  oxide (MAG-OX) 400 MG tablet, Take 2 tablets by mouth daily., Disp: , Rfl:    MELATONIN PO, Take 10 mg by mouth daily., Disp: , Rfl:    meloxicam (MOBIC) 15 MG tablet, Take 15 mg by mouth daily., Disp: , Rfl:    rizatriptan (MAXALT) 10 MG tablet, Take 10 mg by mouth as needed for migraine., Disp: , Rfl:    tiZANidine (ZANAFLEX) 4 MG tablet, Take 4 mg by mouth 3 (three) times daily., Disp: , Rfl:    triamcinolone  cream (KENALOG) 0.1 %, Apply 1 Application topically 2 (two) times daily., Disp: , Rfl:    Bromelains 500 MG TABS, Take by mouth., Disp: , Rfl:    buPROPion (WELLBUTRIN XL) 300 MG 24 hr tablet, Take 300 mg by mouth daily., Disp: , Rfl:    cetirizine (ZYRTEC) 10 MG tablet, Take 1 tablet by mouth daily., Disp: , Rfl:    Diclofenac  Potassium,Migraine, 50 MG PACK, Take 50 mg by mouth 2 (two) times daily as needed. Migraines, Disp: , Rfl:    Erenumab-aooe (AIMOVIG) 70 MG/ML SOAJ, Inject into the skin., Disp: , Rfl:    famotidine  (PEPCID ) 20 MG tablet, Take 1 tablet (20 mg total) by mouth 2 (two) times daily as needed for heartburn or indigestion., Disp: 60 tablet, Rfl: 1   furosemide (LASIX) 20 MG tablet, Take 20 mg by mouth daily as needed., Disp: , Rfl:    ondansetron  (ZOFRAN -ODT) 4 MG disintegrating tablet, Take 1 tablet (4 mg total) by mouth every 8 (eight) hours as needed for nausea or vomiting., Disp: 20 tablet, Rfl: 0   pregabalin (LYRICA) 150 MG capsule, Take 150 mg by mouth 2 (two) times daily., Disp: , Rfl:    sucralfate  (CARAFATE ) 1 g tablet, Take 1 tablet (1 g total) by mouth 3 (three) times daily., Disp: 90 tablet, Rfl: 11   venlafaxine XR (EFFEXOR-XR) 75 MG 24 hr capsule, Take 75 mg by mouth daily., Disp: , Rfl:   Allergies  Allergen Reactions   Doxycycline Other (See  Comments)    Burning in throat   Nitroglycerin Other (See Comments)    Severe headache     ROS  As noted in HPI.   Physical Exam  BP (!) 129/91 (BP Location: Left Arm)   Pulse 80   Temp 98.3 F (36.8 C) (Oral)   Resp 18   SpO2 98%   Constitutional: Well developed, well nourished, no acute distress Eyes:  EOMI, conjunctiva normal bilaterally HENT: Normocephalic, atraumatic,mucus membranes moist  Respiratory: Normal inspiratory effort Cardiovascular: Normal rate GI: nondistended skin: No rash, skin intact Musculoskeletal: Left midfoot tender, with some mild soft tissue swelling. Base of fifth metatarsal tender.  Calcaneus NT. no bruising. Skin intact.  Well-healed mildly tender laceration  medial to distal fibula.  No surrounding erythema, swelling, drainage.  DP 2+. Refill less than 2 seconds. Sensation grossly intact. Patient able to move all toes actively.  no pain with inversion / eversion,  dorsiflexion / plantarflexion. No Tenderness along the plantar fascia. Distal fibula NT, Medial malleolus NT,  Deltoid ligament NT, Lateral ligaments NT, Achilles NT. Patient able to bear weight while in department.  Neurologic: Alert & oriented x 3, no focal neuro deficits Psychiatric: Speech and behavior appropriate   ED Course   Medications - No data to display  Orders Placed This Encounter  Procedures   DG Foot Complete Left    Standing Status:   Standing    Number of Occurrences:   1    Reason for Exam (SYMPTOM  OR DIAGNOSIS REQUIRED):   Midfoot tenderness, recent overuse, no trauma.  Rule out acute changes    No results found for this or any previous visit (from the past 24 hours). DG Foot Complete Left Result Date: 02/28/2023 CLINICAL DATA:  Midfoot tenderness. Recent overuse. No history of trauma EXAM: LEFT FOOT - COMPLETE 3 VIEW COMPARISON:  None Available. FINDINGS: There is no evidence of fracture or dislocation. There is no evidence of arthropathy or other focal bone  abnormality. Soft tissues are unremarkable. IMPRESSION: No acute osseous abnormality. Electronically Signed   By: Ranell Bring M.D.   On: 02/28/2023 15:03    ED Clinical Impression  1. Repetitive strain injury of left foot, initial encounter   2. Foot pain, left      ED Assessment/Plan     Suspect overuse, could be referred pain from scar tissue from the laceration that he sustained about 3-1/2 months ago. deferring imaging of the ankle because he has no bony tenderness and no history of trauma to the area.  Will obtain x-ray of left foot because he has bony tenderness along the midfoot and multiple metatarsals.  He also has some swelling along the dorsum of the foot.  Reviewed imaging independently.  No fracture.  Discussed with patient that radiology overread is pending.  Will contact patient at if overread differs enough from mine and we need to change management.  See radiology report for full details.  X-ray negative for fracture, dislocation.  Advised Voltaren  gel and offered to give him an ASO.  Patient declined an ASO stating that he already has 1.  Follow-up with PCP of choice if symptoms persist in [place] .  Discussed imaging, MDM, treatment plan, and plan for follow-up with patient. patient agrees with plan.   No orders of the defined types were placed in this encounter.     *This clinic note was created using Dragon dictation software. Therefore, there may be occasional mistakes despite careful proofreading.  ?    Van Knee, MD 03/03/23 [phone]

## 2023-04-08 DIAGNOSIS — R0600 Dyspnea, unspecified: Secondary | ICD-10-CM | POA: Diagnosis not present

## 2023-04-08 DIAGNOSIS — I10 Essential (primary) hypertension: Secondary | ICD-10-CM | POA: Diagnosis not present

## 2023-04-08 DIAGNOSIS — R0602 Shortness of breath: Secondary | ICD-10-CM | POA: Diagnosis not present

## 2023-04-08 DIAGNOSIS — K219 Gastro-esophageal reflux disease without esophagitis: Secondary | ICD-10-CM | POA: Diagnosis not present

## 2023-04-08 DIAGNOSIS — E785 Hyperlipidemia, unspecified: Secondary | ICD-10-CM | POA: Diagnosis not present

## 2023-04-08 DIAGNOSIS — R079 Chest pain, unspecified: Secondary | ICD-10-CM | POA: Diagnosis not present

## 2023-04-08 DIAGNOSIS — Z79899 Other long term (current) drug therapy: Secondary | ICD-10-CM | POA: Diagnosis not present

## 2023-04-08 DIAGNOSIS — R0789 Other chest pain: Secondary | ICD-10-CM | POA: Diagnosis not present

## 2023-04-09 DIAGNOSIS — R0789 Other chest pain: Secondary | ICD-10-CM | POA: Diagnosis not present

## 2023-04-09 DIAGNOSIS — R079 Chest pain, unspecified: Secondary | ICD-10-CM | POA: Diagnosis not present

## 2023-04-17 DIAGNOSIS — R079 Chest pain, unspecified: Secondary | ICD-10-CM | POA: Diagnosis not present

## 2023-05-16 DIAGNOSIS — G4489 Other headache syndrome: Secondary | ICD-10-CM | POA: Diagnosis not present

## 2023-05-16 DIAGNOSIS — Z1211 Encounter for screening for malignant neoplasm of colon: Secondary | ICD-10-CM | POA: Diagnosis not present

## 2023-05-16 DIAGNOSIS — K219 Gastro-esophageal reflux disease without esophagitis: Secondary | ICD-10-CM | POA: Diagnosis not present

## 2023-05-16 DIAGNOSIS — I10 Essential (primary) hypertension: Secondary | ICD-10-CM | POA: Diagnosis not present

## 2023-06-27 DIAGNOSIS — K219 Gastro-esophageal reflux disease without esophagitis: Secondary | ICD-10-CM | POA: Diagnosis not present

## 2023-06-27 DIAGNOSIS — R09A2 Foreign body sensation, throat: Secondary | ICD-10-CM | POA: Diagnosis not present

## 2023-07-13 DIAGNOSIS — K219 Gastro-esophageal reflux disease without esophagitis: Secondary | ICD-10-CM | POA: Diagnosis not present

## 2023-07-13 DIAGNOSIS — J312 Chronic pharyngitis: Secondary | ICD-10-CM | POA: Diagnosis not present

## 2023-07-13 DIAGNOSIS — J384 Edema of larynx: Secondary | ICD-10-CM | POA: Diagnosis not present

## 2023-08-15 DIAGNOSIS — I10 Essential (primary) hypertension: Secondary | ICD-10-CM | POA: Diagnosis not present

## 2023-08-15 DIAGNOSIS — M2550 Pain in unspecified joint: Secondary | ICD-10-CM | POA: Diagnosis not present

## 2023-08-15 DIAGNOSIS — Z0000 Encounter for general adult medical examination without abnormal findings: Secondary | ICD-10-CM | POA: Diagnosis not present

## 2023-08-15 DIAGNOSIS — K219 Gastro-esophageal reflux disease without esophagitis: Secondary | ICD-10-CM | POA: Diagnosis not present

## 2023-08-15 DIAGNOSIS — M359 Systemic involvement of connective tissue, unspecified: Secondary | ICD-10-CM | POA: Diagnosis not present

## 2023-09-07 DIAGNOSIS — J312 Chronic pharyngitis: Secondary | ICD-10-CM | POA: Diagnosis not present

## 2023-09-07 DIAGNOSIS — K219 Gastro-esophageal reflux disease without esophagitis: Secondary | ICD-10-CM | POA: Diagnosis not present

## 2023-09-07 DIAGNOSIS — J384 Edema of larynx: Secondary | ICD-10-CM | POA: Diagnosis not present

## 2023-09-13 DIAGNOSIS — M542 Cervicalgia: Secondary | ICD-10-CM | POA: Diagnosis not present

## 2023-09-13 DIAGNOSIS — G44209 Tension-type headache, unspecified, not intractable: Secondary | ICD-10-CM | POA: Diagnosis not present

## 2023-09-13 DIAGNOSIS — Z658 Other specified problems related to psychosocial circumstances: Secondary | ICD-10-CM | POA: Diagnosis not present

## 2023-09-13 DIAGNOSIS — I10 Essential (primary) hypertension: Secondary | ICD-10-CM | POA: Diagnosis not present

## 2023-10-03 DIAGNOSIS — G44209 Tension-type headache, unspecified, not intractable: Secondary | ICD-10-CM | POA: Diagnosis not present

## 2023-10-03 DIAGNOSIS — I10 Essential (primary) hypertension: Secondary | ICD-10-CM | POA: Diagnosis not present

## 2023-10-03 DIAGNOSIS — R768 Other specified abnormal immunological findings in serum: Secondary | ICD-10-CM | POA: Diagnosis not present

## 2023-10-03 DIAGNOSIS — M2550 Pain in unspecified joint: Secondary | ICD-10-CM | POA: Diagnosis not present

## 2023-10-09 DIAGNOSIS — M130 Polyarthritis, unspecified: Secondary | ICD-10-CM | POA: Diagnosis not present

## 2023-10-09 DIAGNOSIS — R768 Other specified abnormal immunological findings in serum: Secondary | ICD-10-CM | POA: Diagnosis not present

## 2023-10-09 DIAGNOSIS — G5690 Unspecified mononeuropathy of unspecified upper limb: Secondary | ICD-10-CM | POA: Diagnosis not present
